# Patient Record
Sex: Female | Born: 2000 | Race: Black or African American | Hispanic: No | Marital: Single | Smoking: Current every day smoker
Health system: Southern US, Community
[De-identification: ages and names within clinical notes are randomized; demographics above are authoritative.]

## PROBLEM LIST (undated history)

## (undated) ENCOUNTER — Inpatient Hospital Stay (HOSPITAL_COMMUNITY): Payer: Self-pay

## (undated) ENCOUNTER — Ambulatory Visit (HOSPITAL_COMMUNITY): Source: Home / Self Care

## (undated) DIAGNOSIS — O139 Gestational [pregnancy-induced] hypertension without significant proteinuria, unspecified trimester: Secondary | ICD-10-CM

## (undated) HISTORY — DX: Gestational (pregnancy-induced) hypertension without significant proteinuria, unspecified trimester: O13.9

## (undated) HISTORY — PX: NO PAST SURGERIES: SHX2092

---
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
## 2019-01-05 ENCOUNTER — Encounter: Payer: Self-pay | Admitting: Obstetrics and Gynecology

## 2019-01-05 ENCOUNTER — Ambulatory Visit (INDEPENDENT_AMBULATORY_CARE_PROVIDER_SITE_OTHER): Payer: Medicaid Other | Admitting: Obstetrics and Gynecology

## 2019-01-05 ENCOUNTER — Other Ambulatory Visit: Payer: Self-pay

## 2019-01-05 DIAGNOSIS — Z3403 Encounter for supervision of normal first pregnancy, third trimester: Secondary | ICD-10-CM | POA: Diagnosis not present

## 2019-01-05 DIAGNOSIS — Z3400 Encounter for supervision of normal first pregnancy, unspecified trimester: Secondary | ICD-10-CM

## 2019-01-05 DIAGNOSIS — Z3A31 31 weeks gestation of pregnancy: Secondary | ICD-10-CM

## 2019-01-05 NOTE — Progress Notes (Signed)
S/w pt via tele-visit. Pt reports fetal movement, denies pain. 

## 2019-01-05 NOTE — Progress Notes (Signed)
   TELEHEALTH VIRTUAL OBSTETRICS VISIT ENCOUNTER NOTE  I connected with Kathryn Pena on 01/05/19 at  4:00 PM EDT by telephone at home and verified that I am speaking with the correct person using two identifiers.   I discussed the limitations, risks, security and privacy concerns of performing an evaluation and management service by telephone and the availability of in person appointments. I also discussed with the patient that there may be a patient responsible charge related to this service. The patient expressed understanding and agreed to proceed.  Subjective:  Kathryn Pena is a 18 y.o. G1P0 at [redacted]w[redacted]d being followed for ongoing prenatal care.  She is currently monitored for the following issues for this low-risk pregnancy and has Supervision of normal pregnancy, antepartum on their problem list.  Patients reports she is having trouble sleeping. Reports she is very tired but can't go to sleep. Has been happening for about a month. She reports that she has pain occasionally when baby moves. Reports fetal movement. Denies any contractions, bleeding or leaking of fluid.   The following portions of the patient's history were reviewed and updated as appropriate: allergies, current medications, past family history, past medical history, past social history, past surgical history and problem list.   Objective:   General:  Alert, oriented and cooperative.   Mental Status: Normal mood and affect perceived. Normal judgment and thought content.  Rest of physical exam deferred due to type of encounter  Assessment and Plan:  Pregnancy: G1P0 at [redacted]w[redacted]d  1. Supervision of normal first pregnancy, antepartum Enroll in babyscripts optimization Reviewed strategies for improved sleep  Preterm labor symptoms and general obstetric precautions including but not limited to vaginal bleeding, contractions, leaking of fluid and fetal movement were reviewed in detail with the patient.  I discussed the  assessment and treatment plan with the patient. The patient was provided an opportunity to ask questions and all were answered. The patient agreed with the plan and demonstrated an understanding of the instructions. The patient was advised to call back or seek an in-person office evaluation/go to [facility]U at [facility] for any urgent or concerning symptoms. Please refer to After Visit Summary for other counseling recommendations.   Return in about 3 weeks (around 01/26/2019) for OB visit.  Future Appointments  Date Time Provider Department Center  01/05/2019  4:00 PM Conan Bowens, MD [facility] None  01/19/2019  3:30 PM Hermina Staggers, MD [facility] None    Conan Bowens, MD Center for [facility]

## 2019-01-06 ENCOUNTER — Telehealth: Payer: Self-pay

## 2019-01-06 ENCOUNTER — Inpatient Hospital Stay (HOSPITAL_COMMUNITY)
Admission: AD | Admit: 2019-01-06 | Discharge: 2019-01-06 | Disposition: A | Discharge status: Home / Self Care | Payer: Medicaid Other | Source: Ambulatory Visit | Attending: Obstetrics and Gynecology | Admitting: Obstetrics and Gynecology

## 2019-01-06 ENCOUNTER — Other Ambulatory Visit: Payer: Self-pay

## 2019-01-06 ENCOUNTER — Encounter (HOSPITAL_COMMUNITY): Payer: Self-pay | Admitting: *Deleted

## 2019-01-06 DIAGNOSIS — R040 Epistaxis: Secondary | ICD-10-CM | POA: Diagnosis not present

## 2019-01-06 DIAGNOSIS — Z87891 Personal history of nicotine dependence: Secondary | ICD-10-CM | POA: Insufficient documentation

## 2019-01-06 DIAGNOSIS — Z3A31 31 weeks gestation of pregnancy: Secondary | ICD-10-CM | POA: Diagnosis not present

## 2019-01-06 DIAGNOSIS — Z3A36 36 weeks gestation of pregnancy: Secondary | ICD-10-CM | POA: Diagnosis not present

## 2019-01-06 DIAGNOSIS — O26893 Other specified pregnancy related conditions, third trimester: Secondary | ICD-10-CM | POA: Diagnosis not present

## 2019-01-06 DIAGNOSIS — O163 Unspecified maternal hypertension, third trimester: Secondary | ICD-10-CM | POA: Insufficient documentation

## 2019-01-06 DIAGNOSIS — Z3689 Encounter for other specified antenatal screening: Secondary | ICD-10-CM | POA: Insufficient documentation

## 2019-01-06 DIAGNOSIS — O9989 Other specified diseases and conditions complicating pregnancy, childbirth and the puerperium: Secondary | ICD-10-CM | POA: Diagnosis not present

## 2019-01-06 LAB — URINALYSIS, ROUTINE W REFLEX MICROSCOPIC
Bilirubin Urine: NEGATIVE
Glucose, UA: NEGATIVE mg/dL
Hgb urine dipstick: NEGATIVE
KETONES UR: NEGATIVE mg/dL
Leukocytes,Ua: NEGATIVE
NITRITE: NEGATIVE
Protein, ur: NEGATIVE mg/dL
Specific Gravity, Urine: 1.013 (ref 1.005–1.030)
pH: 7 (ref 5.0–8.0)

## 2019-01-06 LAB — CBC
HCT: 30.8 % — ABNORMAL LOW (ref 36.0–46.0)
Hemoglobin: 10 g/dL — ABNORMAL LOW (ref 12.0–15.0)
MCH: 28.8 pg (ref 26.0–34.0)
MCHC: 32.5 g/dL (ref 30.0–36.0)
MCV: 88.8 fL (ref 80.0–100.0)
NRBC: 0 % (ref 0.0–0.2)
Platelets: 193 10*3/uL (ref 150–400)
RBC: 3.47 MIL/uL — ABNORMAL LOW (ref 3.87–5.11)
RDW: 14.3 % (ref 11.5–15.5)
WBC: 5.1 10*3/uL (ref 4.0–10.5)

## 2019-01-06 LAB — COMPREHENSIVE METABOLIC PANEL
ALT: 13 U/L (ref 0–44)
AST: 17 U/L (ref 15–41)
Albumin: 2.8 g/dL — ABNORMAL LOW (ref 3.5–5.0)
Alkaline Phosphatase: 59 U/L (ref 38–126)
Anion gap: 7 (ref 5–15)
BUN: 6 mg/dL (ref 6–20)
CHLORIDE: 106 mmol/L (ref 98–111)
CO2: 22 mmol/L (ref 22–32)
Calcium: 8.7 mg/dL — ABNORMAL LOW (ref 8.9–10.3)
Creatinine, Ser: 0.78 mg/dL (ref 0.44–1.00)
GFR calc Af Amer: >60 mL/min (ref >60)
GFR calc non Af Amer: >60 mL/min (ref >60)
Glucose, Bld: 94 mg/dL (ref 70–99)
Potassium: 3.4 mmol/L — ABNORMAL LOW (ref 3.5–5.1)
Sodium: 135 mmol/L (ref 135–145)
Total Bilirubin: 0.3 mg/dL (ref 0.3–1.2)
Total Protein: 5.7 g/dL — ABNORMAL LOW (ref 6.5–8.1)

## 2019-01-06 LAB — PROTEIN / CREATININE RATIO, URINE
Creatinine, Urine: 102.62 mg/dL
Protein Creatinine Ratio: 0.11 mg/mg{Cre} (ref 0.00–0.15)
Total Protein, Urine: 11 mg/dL

## 2019-01-06 NOTE — Telephone Encounter (Signed)
TC from pt regarding Blood Pressure   reading at 9am was 150/90  9:30am 140/106 10:00am 130/90  Pt noted HA, no visual changes, no swelling, No LOF, no pain or pressure Consulted w/ MD in the office per Dr.Dove pt need to go to MAU for evaluation.

## 2019-01-06 NOTE — MAU Note (Signed)
PT presents to MAU with complaints of having a high blood pressure today while she was at work. She had a nose bleed this morning and her mom told her to check her blood pressure. Denies any pain

## 2019-01-06 NOTE — Discharge Instructions (Signed)
Hypertension During Pregnancy ° °Hypertension, commonly called high blood pressure, is when the force of blood pumping through your arteries is too strong. Arteries are blood vessels that carry blood from the heart throughout the body. Hypertension during pregnancy can cause problems for you and your baby. Your baby may be born early (prematurely) or may not weigh as much as he or she should at birth. Very bad cases of hypertension during pregnancy can be life-threatening. °Different types of hypertension can occur during pregnancy. These include: °· Chronic hypertension. This happens when: °? You have hypertension before pregnancy and it continues during pregnancy. °? You develop hypertension before you are [redacted] weeks pregnant, and it continues during pregnancy. °· Gestational hypertension. This is hypertension that develops after the 20th week of pregnancy. °· Preeclampsia, also called toxemia of pregnancy. This is a very serious type of hypertension that develops during pregnancy. It can be very dangerous for you and your baby. °? In rare cases, you may develop preeclampsia after giving birth (postpartum preeclampsia). This usually occurs within 48 hours after childbirth but may occur up to 6 weeks after giving birth. °Gestational hypertension and preeclampsia usually go away within 6 weeks after your baby is born. Women who have hypertension during pregnancy have a greater chance of developing hypertension later in life or during future pregnancies. °What are the causes? °The exact cause of hypertension during pregnancy is not known. °What increases the risk? °There are certain factors that make it more likely for you to develop hypertension during pregnancy. These include: °· Having hypertension during a previous pregnancy or prior to pregnancy. °· Being overweight. °· Being age 35 or older. °· Being pregnant for the first time. °· Being pregnant with more than one baby. °· Becoming pregnant using fertilization  methods such as IVF (in vitro fertilization). °· Having diabetes, kidney problems, or systemic lupus erythematosus. °· Having a family history of hypertension. °What are the signs or symptoms? °Chronic hypertension and gestational hypertension rarely cause symptoms. Preeclampsia causes symptoms, which may include: °· Increased protein in your urine. Your health care provider will check for this at every visit before you give birth (prenatal visit). °· Severe headaches. °· Sudden weight gain. °· Swelling of the hands, face, legs, and feet. °· Nausea and vomiting. °· Vision problems, such as blurred or double vision. °· Numbness in the face, arms, legs, and feet. °· Dizziness. °· Slurred speech. °· Sensitivity to bright lights. °· Abdominal pain. °· Convulsions or seizures. °How is this diagnosed? °You may be diagnosed with hypertension during a routine prenatal exam. At each prenatal visit, you may: °· Have a urine test to check for high amounts of protein in your urine. °· Have your blood pressure checked. A blood pressure reading is given as two numbers, such as "120 over 80" (or 120/80). The first ("top") number is a measure of the pressure in your arteries when your heart beats (systolic pressure). The second ("bottom") number is a measure of the pressure in your arteries as your heart relaxes between beats (diastolic pressure). Blood pressure is measured in a unit called mm Hg. For most women, a normal blood pressure reading is: °? Systolic: below 120. °? Diastolic: below 80. °The type of hypertension that you are diagnosed with depends on your test results and when your symptoms developed. °· Chronic hypertension is usually diagnosed before 20 weeks of pregnancy. °· Gestational hypertension is usually diagnosed after 20 weeks of pregnancy. °· Hypertension with high amounts of protein in   the urine is diagnosed as preeclampsia. °· Blood pressure measurements that stay above 160 systolic, or above 110 diastolic,  are signs of severe preeclampsia. °How is this treated? °Treatment for hypertension during pregnancy varies depending on the type of hypertension you have and how serious it is. °· If you take medicines called ACE inhibitors to treat chronic hypertension, you may need to switch medicines. ACE inhibitors should not be taken during pregnancy. °· If you have gestational hypertension, you may need to take blood pressure medicine. °· If you are at risk for preeclampsia, your health care provider may recommend that you take a low-dose aspirin during your pregnancy. °· If you have severe preeclampsia, you may need to be hospitalized so you and your baby can be monitored closely. You may also need to take medicine (magnesium sulfate) to prevent seizures and to lower blood pressure. This medicine may be given as an injection or through an IV. °· In some cases, if your condition gets worse, you may need to deliver your baby early. °Follow these instructions at home: °Eating and drinking ° °· Drink enough fluid to keep your urine pale yellow. °· Avoid caffeine. °Lifestyle °· Do not use any products that contain nicotine or tobacco, such as cigarettes and e-cigarettes. If you need help quitting, ask your health care provider. °· Do not use alcohol or drugs. °· Avoid stress as much as possible. Rest and get plenty of sleep. °General instructions °· Take over-the-counter and prescription medicines only as told by your health care provider. °· While lying down, lie on your left side. This keeps pressure off your major blood vessels. °· While sitting or lying down, raise (elevate) your feet. Try putting some pillows under your lower legs. °· Exercise regularly. Ask your health care provider what kinds of exercise are best for you. °· Keep all prenatal and follow-up visits as told by your health care provider. This is important. °Contact a health care provider if: °· You have symptoms that your health care provider told you may  require more treatment or monitoring, such as: °? Nausea or vomiting. °? Headache. °Get help right away if you have: °· Severe abdominal pain that does not get better with treatment. °· A severe headache that does not get better. °· Vomiting that does not get better. °· Sudden, rapid weight gain. °· Sudden swelling in your hands, ankles, or face. °· Vaginal bleeding. °· Blood in your urine. °· Fewer movements from your baby than usual. °· Blurred or double vision. °· Muscle twitching or sudden muscle tightening (spasms). °· Shortness of breath. °· Blue fingernails or lips. °Summary °· Hypertension, commonly called high blood pressure, is when the force of blood pumping through your arteries is too strong. °· Hypertension during pregnancy can cause problems for you and your baby. °· Treatment for hypertension during pregnancy varies depending on the type of hypertension you have and how serious it is. °· Get help right away if you have symptoms that your health care provider told you to watch for. °This information is not intended to replace advice given to you by your health care provider. Make sure you discuss any questions you have with your health care provider. °Document Released: 06/17/2011 Document Revised: 09/15/2017 Document Reviewed: 03/14/2016 °Elsevier Interactive Patient Education © 2019 [employer] ° °

## 2019-01-06 NOTE — MAU Provider Note (Addendum)
History     CSN: [id number]  Arrival date and time: 01/06/19 1140   First Provider Initiated Contact with Patient 01/06/19 1208      Chief Complaint  Patient presents with  . Hypertension   Ms. Kathryn Pena is a 18 y.o. G1P0 at [redacted]w[redacted]d who presents to MAU for preeclampsia evaluation after she was at work and experienced a nose bleed at about 0730. Pt reports nose bleed lasted about . Pt denies bleeding from her nose since. Pt reports her mother suggested she take her BP and it was 150/90. About 30 min later it was 140/106, later 130/80. Pt called the office and was told to come to MAU for evaluation.  Pt reports HA about 30-57min after her nose bleed and took 2 extra strength Tylenol and HA resolved within an hour. Pt took Tylenol around 0800 today. Denies HA at this time.  Pt denies blurry vision/seeing spots, N/V, epigastric pain, swelling in face and hands, sudden weight gain. Pt denies chest pain and SOB.   Pt denies VB, ctx, LOF and reports good FM.  Current pregnancy problems? none Blood Type? O positive Allergies? NKDA Current medications? PNVs only Current PNC & next appt? [facility], televisit 01/19/2019  Mother present for entire visit.   OB History    Gravida  1   Para      Term      Preterm      AB      Living        SAB      TAB      Ectopic      Multiple      Live Births              Past Medical History:  Diagnosis Date  . Medical history non-contributory     Past Surgical History:  Procedure Laterality Date  . NO PAST SURGERIES      Family History  Problem Relation Age of Onset  . Healthy Mother     Social History   Tobacco Use  . Smoking status: Former Smoker    Types: Cigars    Last attempt to quit: 06/27/2018    Years since quitting: 0.5  . Smokeless tobacco: Never Used  Substance Use Topics  . Alcohol use: No  . Drug use: No    Allergies: No Known Allergies  No medications prior to admission.     Review of Systems  Constitutional: Negative for chills, diaphoresis, fatigue and fever.  HENT: Positive for nosebleeds.   Respiratory: Negative for shortness of breath.   Cardiovascular: Negative for chest pain.  Gastrointestinal: Negative for abdominal distention and abdominal pain.  Genitourinary: Negative for vaginal bleeding and vaginal discharge.  Neurological: Positive for headaches. Negative for dizziness, weakness and light-headedness.   Physical Exam   Blood pressure 127/87, pulse 83, temperature 98.6 F (37 C), resp. rate 16, height 5\' 7"  (1.702 m), weight 97.5 kg, last menstrual period 06/05/2018, SpO2 99 %.  Patient Vitals for the past 24 hrs:  BP Temp Pulse Resp SpO2 Height Weight  01/06/19 1430 127/87 - 83 - 99 % - -  01/06/19 1415 130/82 - 85 - 100 % - -  01/06/19 1400 125/76 - 90 - 100 % - -  01/06/19 1330 124/76 - 88 - 98 % - -  01/06/19 1300 133/85 - 83 - 100 % - -  01/06/19 1245 130/75 - 85 - 98 % - -  01/06/19 1230 135/80 - 88 -  100 % - -  01/06/19 1215 (!) 144/81 - 84 - 99 % - -  01/06/19 1203 (!) 144/80 - 92 - - - -  01/06/19 1154 - - - - - 5\' 7"  (1.702 m) 97.5 kg  01/06/19 1152 (!) 143/78 98.6 F (37 C) 95 16 100 % - -    Physical Exam  Constitutional: She is oriented to person, place, and time. She appears well-developed and well-nourished. No distress.  HENT:  Head: Normocephalic.  Nose: Nose normal.  Respiratory: Effort normal.  GI: Soft. She exhibits no distension and no mass. There is no abdominal tenderness. There is no rebound and no guarding.  Musculoskeletal:        General: Edema (mild, bilateral, non-pitting pedal edema) present.  Neurological: She is alert and oriented to person, place, and time. She has normal reflexes.  Skin: Skin is warm and dry. She is not diaphoretic.  Psychiatric: She has a normal mood and affect. Her behavior is normal.   Results for orders placed or performed during the hospital encounter of 01/06/19 (from  the past 24 hour(s))  Urinalysis, Routine w reflex microscopic     Status: None   Collection Time: 01/06/19 11:50 AM  Result Value Ref Range   Color, Urine YELLOW YELLOW   APPearance CLEAR CLEAR   Specific Gravity, Urine 1.013 1.005 - 1.030   pH 7.0 5.0 - 8.0   Glucose, UA NEGATIVE NEGATIVE mg/dL   Hgb urine dipstick NEGATIVE NEGATIVE   Bilirubin Urine NEGATIVE NEGATIVE   Ketones, ur NEGATIVE NEGATIVE mg/dL   Protein, ur NEGATIVE NEGATIVE mg/dL   Nitrite NEGATIVE NEGATIVE   Leukocytes,Ua NEGATIVE NEGATIVE  Protein / creatinine ratio, urine     Status: None   Collection Time: 01/06/19 12:15 PM  Result Value Ref Range   Creatinine, Urine 102.62 mg/dL   Total Protein, Urine 11 mg/dL   Protein Creatinine Ratio 0.11 0.00 - 0.15 mg/mg[Cre]  CBC     Status: Abnormal   Collection Time: 01/06/19 12:39 PM  Result Value Ref Range   WBC 5.1 4.0 - 10.5 K/uL   RBC 3.47 (L) 3.87 - 5.11 MIL/uL   Hemoglobin 10.0 (L) 12.0 - 15.0 g/dL   HCT 46.5 (L) 03.5 - 46.5 %   MCV 88.8 80.0 - 100.0 fL   MCH 28.8 26.0 - 34.0 pg   MCHC 32.5 30.0 - 36.0 g/dL   RDW 68.1 27.5 - 17.0 %   Platelets 193 150 - 400 K/uL   nRBC 0.0 0.0 - 0.2 %  Comprehensive metabolic panel     Status: Abnormal   Collection Time: 01/06/19 12:39 PM  Result Value Ref Range   Sodium 135 135 - 145 mmol/L   Potassium 3.4 (L) 3.5 - 5.1 mmol/L   Chloride 106 98 - 111 mmol/L   CO2 22 22 - 32 mmol/L   Glucose, Bld 94 70 - 99 mg/dL   BUN 6 6 - 20 mg/dL   Creatinine, Ser 0.17 0.44 - 1.00 mg/dL   Calcium 8.7 (L) 8.9 - 10.3 mg/dL   Total Protein 5.7 (L) 6.5 - 8.1 g/dL   Albumin 2.8 (L) 3.5 - 5.0 g/dL   AST 17 15 - 41 U/L   ALT 13 0 - 44 U/L   Alkaline Phosphatase 59 38 - 126 U/L   Total Bilirubin 0.3 0.3 - 1.2 mg/dL   GFR calc non Af Amer >60 >60 mL/min   GFR calc Af Amer >60 >60 mL/min   Anion  gap 7 5 - 15    No results found.   MAU Course  Procedures  MDM -preeclampsia work-up -no severe range BPs in MAU -EFM baseline  135, mod variability, pos accels (10x10), 1-2 variable decels. TOCO ctx x2, uterine irritability -pt reports feeling 2ctx in MAU, reports she "always feels them when she lays down" -CE: closed, thick, posterior -UA: WNL -CBC: H/H 10/30.8, RBCs 3.47 (pt denies s/sx of anemia - see ROS), otherwise WNL -CMP: no abnormal labs requiring treatment at this time, AST/ALT 17/13 -Pro/Creat: 0.11 -discharge to home in stable condition with mother  Orders Placed This Encounter  Procedures  . Urinalysis, Routine w reflex microscopic    Standing Status:   Standing    Number of Occurrences:   1  . CBC    Standing Status:   Standing    Number of Occurrences:   1  . Comprehensive metabolic panel    Standing Status:   Standing    Number of Occurrences:   1  . Protein / creatinine ratio, urine    Standing Status:   Standing    Number of Occurrences:   1  . Discharge patient    Order Specific Question:   Discharge disposition    Answer:   01-Home or Self Care [1]    Order Specific Question:   Discharge patient date    Answer:   01/06/2019   No orders of the defined types were placed in this encounter.   Assessment and Plan   1. Elevated blood pressure affecting pregnancy in third trimester, antepartum   2. [redacted] weeks gestation of pregnancy   3. NST (non-stress test) reactive   4. Epistaxis    -preeclampsia/PTL/nosebleed/MAU return precautions reviewed -pt advised to hydrate d/t uterine irritability on monitor; discussed appropriate hydration in pregnancy, advised to avoid caffeine -discussed epistaxis in pregnancy -discharged to home in stable condition with mother -please go to the clinic tomorrow AM for a BP check, clinic notified by Epic message to call pt to schedule, pt aware to proceed to clinic in AM even if no phone call received.  Odie Sera Nugent 01/06/2019, 3:09 PM

## 2019-01-07 ENCOUNTER — Ambulatory Visit (INDEPENDENT_AMBULATORY_CARE_PROVIDER_SITE_OTHER): Payer: Medicaid Other

## 2019-01-07 VITALS — BP 131/83 | HR 84 | Ht 67.0 in | Wt 212.5 lb

## 2019-01-07 DIAGNOSIS — O163 Unspecified maternal hypertension, third trimester: Secondary | ICD-10-CM

## 2019-01-07 NOTE — Progress Notes (Signed)
Subjective:  Kathryn Pena is a 18 y.o. female [redacted]w[redacted]d gestation; here for BP check.   Hypertension ROS: taking medications as instructed, no medication side effects noted, no TIA's, no chest pain on exertion, no dyspnea on exertion and no swelling of ankles.    Objective:  BP 131/83   Pulse 84   Ht 5\' 7"  (1.702 m)   Wt 212 lb 8 oz (96.4 kg)   LMP 06/05/2018   BMI 33.28 kg/m    Appearance alert, well appearing, and in no distress. General exam BP noted to be well controlled today in office.    Assessment:   Blood Pressure stable.   Plan:  Current treatment plan is effective, no change in therapy.

## 2019-01-17 ENCOUNTER — Telehealth: Payer: Self-pay

## 2019-01-17 NOTE — Telephone Encounter (Signed)
Contacted pt about logging BPs, pt states that she does not have a cuff, advised to pick up from office.

## 2019-01-19 ENCOUNTER — Encounter: Payer: Self-pay | Admitting: Obstetrics and Gynecology

## 2019-01-19 ENCOUNTER — Other Ambulatory Visit: Payer: Self-pay

## 2019-01-19 ENCOUNTER — Ambulatory Visit (INDEPENDENT_AMBULATORY_CARE_PROVIDER_SITE_OTHER): Payer: Medicaid Other | Admitting: Obstetrics and Gynecology

## 2019-01-19 VITALS — BP 129/91 | HR 81

## 2019-01-19 DIAGNOSIS — Z3A33 33 weeks gestation of pregnancy: Secondary | ICD-10-CM | POA: Diagnosis not present

## 2019-01-19 DIAGNOSIS — O133 Gestational [pregnancy-induced] hypertension without significant proteinuria, third trimester: Secondary | ICD-10-CM

## 2019-01-19 DIAGNOSIS — Z3400 Encounter for supervision of normal first pregnancy, unspecified trimester: Secondary | ICD-10-CM

## 2019-01-19 NOTE — Progress Notes (Signed)
Pt states that she checked her bp this am and it was 129/91. Pt complains of no headaches, visual changes, or epigastric pain. Pt states that she does have some slight swelling at the end of the day in her feet. She has no other concerns.

## 2019-01-19 NOTE — Progress Notes (Signed)
   TELEHEALTH VIRTUAL OBSTETRICS VISIT ENCOUNTER NOTE  I connected with Kathryn Pena on 01/19/19 at  3:30 PM EDT by telephone at home and verified that I am speaking with the correct person using two identifiers.   I discussed the limitations, risks, security and privacy concerns of performing an evaluation and management service by telephone and the availability of in person appointments. I also discussed with the patient that there may be a patient responsible charge related to this service. The patient expressed understanding and agreed to proceed.  Subjective:  Kathryn Pena is a 18 y.o. G1P0 at [redacted]w[redacted]d being followed for ongoing prenatal care.  She is currently monitored for the following issues for this low-risk pregnancy and has Supervision of normal pregnancy, antepartum and Elevated blood pressure affecting pregnancy, antepartum on their problem list.  Patient reports no complaints. Reports fetal movement. Denies any contractions, bleeding or leaking of fluid.   The following portions of the patient's history were reviewed and updated as appropriate: allergies, current medications, past family history, past medical history, past social history, past surgical history and problem list.   Objective:   General:  Alert, oriented and cooperative.   Mental Status: Normal mood and affect perceived. Normal judgment and thought content.  Rest of physical exam deferred due to type of encounter  Assessment and Plan:  Pregnancy: G1P0 at [redacted]w[redacted]d 1. Elevated blood pressure affecting pregnancy, antepartum Monitoring BP at home BP yesterday 123/81 No S/Sx of PEC Will continue to monitor BP daily  2. Supervision of normal first pregnancy, antepartum Stable Vaginal cultures with next visit  Preterm labor symptoms and general obstetric precautions including but not limited to vaginal bleeding, contractions, leaking of fluid and fetal movement were reviewed in detail with the patient.  I  discussed the assessment and treatment plan with the patient. The patient was provided an opportunity to ask questions and all were answered. The patient agreed with the plan and demonstrated an understanding of the instructions. The patient was advised to call back or seek an in-person office evaluation/go to MAU at [facility] for any urgent or concerning symptoms. Please refer to After Visit Summary for other counseling recommendations.   I provided 11 minutes of non-face-to-face time during this encounter.  Return in about 2 weeks (around 02/02/2019) for OB visit face to face for GBS.  No future appointments.  Hermina Staggers, MD [facility], [facility]

## 2019-01-28 ENCOUNTER — Telehealth: Payer: Self-pay

## 2019-01-28 NOTE — Telephone Encounter (Signed)
Kathryn Pena from [facility] called to report the patient had an elevated BP of 149/90 on 01/25/19. I called patient to have her recheck bp this am bp is 134/90 pulse 85. Pt does not have any headache, swelling, visual changes, or epigastric pain. Discussed with Dr. Marice Potter who states for patient to continue to monitor BP.   Informed patient of this and advised for her to contact office or go to MAU for evaluation if she develops headache, swelling, visual changes, or epigastric pain.

## 2019-01-31 ENCOUNTER — Telehealth: Payer: Self-pay | Admitting: *Deleted

## 2019-01-31 NOTE — Telephone Encounter (Signed)
Spoke with pt today after receiving call from [facility] re: BP readings.  Had pt repeat BP while on phone- 131/93. Pt had readings earlier (per [facility]) of 143/94, 135/93, 120/90. Pt denies any symptoms at this time- no HA,visiopn changes, swelling changes. Pt has OB appt this week and advised to keep appt, may need additional lab work.  Pt made aware to review with Dr Jolayne Panther for any further recommendations.

## 2019-02-01 ENCOUNTER — Telehealth: Payer: Self-pay | Admitting: *Deleted

## 2019-02-01 NOTE — Telephone Encounter (Signed)
Review with Dr Jolayne Panther, pt to keep OB appt this week and continue to monitor BP. Pt needs to discuss at appt.

## 2019-02-01 NOTE — Telephone Encounter (Signed)
Spoke with pt after call from [facility] regarding BP. Pt readings today, 144/96 repeat at time of call- 147/88. Pt states no symptoms at this time. Pt advised to keep appt tomorrow. Advised if any changes or signs/symptoms to be seen at hospital. Pt states understanding.

## 2019-02-02 ENCOUNTER — Other Ambulatory Visit (HOSPITAL_COMMUNITY)
Admission: RE | Admit: 2019-02-02 | Discharge: 2019-02-02 | Disposition: A | Discharge status: Home / Self Care | Payer: Medicaid Other | Source: Ambulatory Visit | Attending: Obstetrics & Gynecology | Admitting: Obstetrics & Gynecology

## 2019-02-02 ENCOUNTER — Other Ambulatory Visit: Payer: Self-pay

## 2019-02-02 ENCOUNTER — Ambulatory Visit (INDEPENDENT_AMBULATORY_CARE_PROVIDER_SITE_OTHER): Payer: Medicaid Other | Admitting: Obstetrics & Gynecology

## 2019-02-02 ENCOUNTER — Other Ambulatory Visit: Payer: Self-pay | Admitting: Obstetrics & Gynecology

## 2019-02-02 DIAGNOSIS — Z3A35 35 weeks gestation of pregnancy: Secondary | ICD-10-CM

## 2019-02-02 DIAGNOSIS — O133 Gestational [pregnancy-induced] hypertension without significant proteinuria, third trimester: Secondary | ICD-10-CM

## 2019-02-02 DIAGNOSIS — Z3400 Encounter for supervision of normal first pregnancy, unspecified trimester: Secondary | ICD-10-CM | POA: Insufficient documentation

## 2019-02-02 NOTE — Progress Notes (Signed)
Patient reports fetal movement with occasional pressure. 

## 2019-02-02 NOTE — Progress Notes (Signed)
   PRENATAL VISIT NOTE  Subjective:  Kathryn Pena is a 18 y.o. G1P0 at [redacted]w[redacted]d being seen today for ongoing prenatal care.  She is currently monitored for the following issues for this high-risk pregnancy and has Supervision of normal pregnancy, antepartum and Gestational (pregnancy-induced) hypertension without significant proteinuria, third trimester on their problem list.  Patient reports no complaints and Leg edema.  Contractions: Irregular. Vag. Bleeding: None.  Movement: Present. Denies leaking of fluid.   The following portions of the patient's history were reviewed and updated as appropriate: allergies, current medications, past family history, past medical history, past social history, past surgical history and problem list.   Objective:   Vitals:   02/02/19 1611  BP: 132/87  Pulse: 91  Weight: 225 lb 6.4 oz (102.2 kg)    Fetal Status: Fetal Heart Rate (bpm): 140   Movement: Present     General:  Alert, oriented and cooperative. Patient is in no acute distress.  Skin: Skin is warm and dry. No rash noted.   Cardiovascular: Normal heart rate noted  Respiratory: Normal respiratory effort, no problems with respiration noted  Abdomen: Soft, gravid, appropriate for gestational age.  Pain/Pressure: Absent     Pelvic: Cervical exam deferred        Extremities: Normal range of motion.  Edema: Trace  Mental Status: Normal mood and affect. Normal behavior. Normal judgment and thought content.   Assessment and Plan:  Pregnancy: G1P0 at [redacted]w[redacted]d 1. Supervision of normal first pregnancy, antepartum routine - Strep Gp B NAA - Cervicovaginal ancillary only( [facility])  2. Gestational (pregnancy-induced) hypertension without significant proteinuria, third trimester Needs IOL 37 weeks and NST today, growth [place]  Preterm labor symptoms and general obstetric precautions including but not limited to vaginal bleeding, contractions, leaking of fluid and fetal movement were reviewed in  detail with the patient. Please refer to After Visit Summary for other counseling recommendations.   Return in about 1 week (around 02/09/2019).  No future appointments.  Scheryl Darter, MD

## 2019-02-02 NOTE — Patient Instructions (Signed)
Labor Induction    Labor induction is when steps are taken to cause a pregnant woman to begin the labor process. Most women go into labor on their own between 37 weeks and 42 weeks of pregnancy. When this does not happen or when there is a medical need for labor to begin, steps may be taken to induce labor. Labor induction causes a pregnant woman's uterus to contract. It also causes the cervix to soften (ripen), open (dilate), and thin out (efface). Usually, labor is not induced before 39 weeks of pregnancy unless there is a medical reason to do so. Your health care provider will determine if labor induction is needed.  Before inducing labor, your health care provider will consider a number of factors, including:  · Your medical condition and your baby's.  · How many weeks along you are in your pregnancy.  · How mature your baby's lungs are.  · The condition of your cervix.  · The position of your baby.  · The size of your birth canal.  What are some reasons for labor induction?  Labor may be induced if:  · Your health or your baby's health is at risk.  · Your pregnancy is overdue by 1 week or more.  · Your water breaks but labor does not start on its own.  · There is a low amount of amniotic fluid around your baby.  You may also choose (elect) to have labor induced at a certain time. Generally, elective labor induction is done no earlier than 39 weeks of pregnancy.  What methods are used for labor induction?  Methods used for labor induction include:  · Prostaglandin medicine. This medicine starts contractions and causes the cervix to dilate and ripen. It can be taken by mouth (orally) or by being inserted into the vagina (suppository).  · Inserting a small, thin tube (catheter) with a balloon into the vagina and then expanding the balloon with water to dilate the cervix.  · Stripping the membranes. In this method, your health care provider gently separates amniotic sac tissue from the cervix. This causes the  cervix to stretch, which in turn causes the release of a hormone called progesterone. The hormone causes the uterus to contract. This procedure is often done during an office visit, after which you will be sent home to wait for contractions to begin.  · Breaking the water. In this method, your health care provider uses a small instrument to make a small hole in the amniotic sac. This eventually causes the amniotic sac to break. Contractions should begin after a few hours.  · Medicine to trigger or strengthen contractions. This medicine is given through an IV that is inserted into a vein in your arm.  Except for membrane stripping, which can be done in a clinic, labor induction is done in the hospital so that you and your baby can be carefully monitored.  How long does it take for labor to be induced?  The length of time it takes to induce labor depends on how ready your body is for labor. Some inductions can take up to 2-3 days, while others may take less than a day. Induction may take longer if:  · You are induced early in your pregnancy.  · It is your first pregnancy.  · Your cervix is not ready.  What are some risks associated with labor induction?  Some risks associated with labor induction include:  · Changes in fetal heart rate, such as being too   high, too low, or irregular (erratic).  · Failed induction.  · Infection in the mother or the baby.  · Increased risk of having a cesarean delivery.  · Fetal death.  · Breaking off (abruption) of the placenta from the uterus (rare).  · Rupture of the uterus (very rare).  When induction is needed for medical reasons, the benefits of induction generally outweigh the risks.  What are some reasons for not inducing labor?  Labor induction should not be done if:  · Your baby does not tolerate contractions.  · You have had previous surgeries on your uterus, such as a myomectomy, removal of fibroids, or a vertical scar from a previous cesarean delivery.  · Your placenta lies  very low in your uterus and blocks the opening of the cervix (placenta previa).  · Your baby is not in a head-down position.  · The umbilical cord drops down into the birth canal in front of the baby.  · There are unusual circumstances, such as the baby being very early (premature).  · You have had more than 2 previous cesarean deliveries.  Summary  · Labor induction is when steps are taken to cause a pregnant woman to begin the labor process.  · Labor induction causes a pregnant woman's uterus to contract. It also causes the cervix to ripen, dilate, and efface.  · Labor is not induced before 39 weeks of pregnancy unless there is a medical reason to do so.  · When induction is needed for medical reasons, the benefits of induction generally outweigh the risks.  This information is not intended to replace advice given to you by your health care provider. Make sure you discuss any questions you have with your health care provider.  Document Released: 02/18/2007 Document Revised: 11/12/2016 Document Reviewed: 11/12/2016  Elsevier Interactive Patient Education © 2019 [employer]

## 2019-02-03 ENCOUNTER — Telehealth (HOSPITAL_COMMUNITY): Payer: Self-pay | Admitting: *Deleted

## 2019-02-03 ENCOUNTER — Encounter (HOSPITAL_COMMUNITY): Payer: Self-pay | Admitting: *Deleted

## 2019-02-03 NOTE — Telephone Encounter (Signed)
Preadmission screen  

## 2019-02-04 LAB — CERVICOVAGINAL ANCILLARY ONLY
Chlamydia: NEGATIVE
Neisseria Gonorrhea: NEGATIVE

## 2019-02-04 LAB — STREP GP B NAA: Strep Gp B NAA: NEGATIVE

## 2019-02-07 ENCOUNTER — Other Ambulatory Visit: Payer: Self-pay | Admitting: Family Medicine

## 2019-02-09 ENCOUNTER — Ambulatory Visit (HOSPITAL_COMMUNITY)
Admission: RE | Admit: 2019-02-09 | Discharge: 2019-02-09 | Disposition: A | Discharge status: Home / Self Care | Payer: Medicaid Other | Source: Ambulatory Visit | Attending: Obstetrics and Gynecology | Admitting: Obstetrics and Gynecology

## 2019-02-09 ENCOUNTER — Other Ambulatory Visit (HOSPITAL_COMMUNITY): Payer: Self-pay | Admitting: Maternal & Fetal Medicine

## 2019-02-09 ENCOUNTER — Other Ambulatory Visit: Payer: Self-pay | Admitting: Obstetrics & Gynecology

## 2019-02-09 ENCOUNTER — Telehealth: Payer: Self-pay | Admitting: *Deleted

## 2019-02-09 ENCOUNTER — Other Ambulatory Visit: Payer: Self-pay

## 2019-02-09 ENCOUNTER — Encounter: Payer: Medicaid Other | Admitting: Obstetrics & Gynecology

## 2019-02-09 DIAGNOSIS — Z3A36 36 weeks gestation of pregnancy: Secondary | ICD-10-CM

## 2019-02-09 DIAGNOSIS — O133 Gestational [pregnancy-induced] hypertension without significant proteinuria, third trimester: Secondary | ICD-10-CM | POA: Diagnosis present

## 2019-02-09 DIAGNOSIS — Z362 Encounter for other antenatal screening follow-up: Secondary | ICD-10-CM | POA: Diagnosis not present

## 2019-02-09 DIAGNOSIS — Z3400 Encounter for supervision of normal first pregnancy, unspecified trimester: Secondary | ICD-10-CM

## 2019-02-09 NOTE — Telephone Encounter (Signed)
Call received from [facility] regarding increase in BP reading. Reading today is 135/95. Dr. Debroah Loop made aware, pt has appt with him today.

## 2019-02-10 ENCOUNTER — Encounter: Payer: Self-pay | Admitting: Obstetrics and Gynecology

## 2019-02-10 ENCOUNTER — Ambulatory Visit (INDEPENDENT_AMBULATORY_CARE_PROVIDER_SITE_OTHER): Payer: Medicaid Other | Admitting: Obstetrics and Gynecology

## 2019-02-10 VITALS — BP 142/97 | HR 92 | Wt 228.0 lb

## 2019-02-10 DIAGNOSIS — O133 Gestational [pregnancy-induced] hypertension without significant proteinuria, third trimester: Secondary | ICD-10-CM

## 2019-02-10 DIAGNOSIS — Z3A36 36 weeks gestation of pregnancy: Secondary | ICD-10-CM

## 2019-02-10 DIAGNOSIS — Z3400 Encounter for supervision of normal first pregnancy, unspecified trimester: Secondary | ICD-10-CM

## 2019-02-10 NOTE — Progress Notes (Signed)
   PRENATAL VISIT NOTE  Subjective:  Kathryn Pena is a 18 y.o. G1P0 at [redacted]w[redacted]d being seen today for ongoing prenatal care.  She is currently monitored for the following issues for this high-risk pregnancy and has Supervision of normal pregnancy, antepartum and Gestational (pregnancy-induced) hypertension without significant proteinuria, third trimester on their problem list.  Patient reports occasional contractions.  Contractions: Irritability. Vag. Bleeding: None.  Movement: Present. Denies leaking of fluid.   The following portions of the patient's history were reviewed and updated as appropriate: allergies, current medications, past family history, past medical history, past social history, past surgical history and problem list.   Objective:   Vitals:   02/10/19 1028  BP: (!) 142/97  Pulse: 92  Weight: 228 lb (103.4 kg)    Fetal Status:     Movement: Present     General:  Alert, oriented and cooperative. Patient is in no acute distress.  Skin: Skin is warm and dry. No rash noted.   Cardiovascular: Normal heart rate noted  Respiratory: Normal respiratory effort, no problems with respiration noted  Abdomen: Soft, gravid, appropriate for gestational age.  Pain/Pressure: Absent     Pelvic: Cervical exam deferred        Extremities: Normal range of motion.  Edema: Trace  Mental Status: Normal mood and affect. Normal behavior. Normal judgment and thought content.   Assessment and Plan:  Pregnancy: G1P0 at [redacted]w[redacted]d  1. Supervision of normal first pregnancy, antepartum Answered all questions, reviewed induction  2. Gestational (pregnancy-induced) hypertension without significant proteinuria, third trimester IOL scheduled in 2 days for 37 weeks Reviewed reasons to present to MAU earlier than scheduled NSt reactive  Preterm labor symptoms and general obstetric precautions including but not limited to vaginal bleeding, contractions, leaking of fluid and fetal movement were reviewed  in detail with the patient. Please refer to After Visit Summary for other counseling recommendations.   Return in about 5 weeks (around 03/17/2019) for post partum check.  Future Appointments  Date Time Provider Department Center  02/12/2019  7:30 AM [facility] SCHED ROOM [facility] None    Conan Bowens, MD

## 2019-02-10 NOTE — Progress Notes (Signed)
U/S 02/09/19 GBS done on 02/02/19 NEGATIVE   Patient Needs Out of Work Note pregnancy and Post partum.  IOL on 02/12/19  Pt denies an HA's or Visual Changes.

## 2019-02-12 ENCOUNTER — Inpatient Hospital Stay (HOSPITAL_COMMUNITY): Payer: Medicaid Other

## 2019-02-12 ENCOUNTER — Inpatient Hospital Stay (HOSPITAL_COMMUNITY): Payer: Medicaid Other | Admitting: Anesthesiology

## 2019-02-12 ENCOUNTER — Inpatient Hospital Stay (HOSPITAL_COMMUNITY)
Admission: AD | Admit: 2019-02-12 | Discharge: 2019-02-16 | DRG: 806 | Disposition: A | Discharge status: Home / Self Care | Payer: Medicaid Other | Attending: Family Medicine | Admitting: Family Medicine

## 2019-02-12 ENCOUNTER — Other Ambulatory Visit: Payer: Self-pay

## 2019-02-12 ENCOUNTER — Encounter (HOSPITAL_COMMUNITY): Payer: Self-pay | Admitting: *Deleted

## 2019-02-12 DIAGNOSIS — O1410 Severe pre-eclampsia, unspecified trimester: Secondary | ICD-10-CM | POA: Diagnosis not present

## 2019-02-12 DIAGNOSIS — Z30430 Encounter for insertion of intrauterine contraceptive device: Secondary | ICD-10-CM

## 2019-02-12 DIAGNOSIS — O1414 Severe pre-eclampsia complicating childbirth: Secondary | ICD-10-CM | POA: Diagnosis present

## 2019-02-12 DIAGNOSIS — O133 Gestational [pregnancy-induced] hypertension without significant proteinuria, third trimester: Secondary | ICD-10-CM

## 2019-02-12 DIAGNOSIS — O26833 Pregnancy related renal disease, third trimester: Secondary | ICD-10-CM | POA: Diagnosis present

## 2019-02-12 DIAGNOSIS — N179 Acute kidney failure, unspecified: Secondary | ICD-10-CM | POA: Diagnosis present

## 2019-02-12 DIAGNOSIS — Z3A37 37 weeks gestation of pregnancy: Secondary | ICD-10-CM

## 2019-02-12 DIAGNOSIS — O134 Gestational [pregnancy-induced] hypertension without significant proteinuria, complicating childbirth: Secondary | ICD-10-CM | POA: Diagnosis present

## 2019-02-12 DIAGNOSIS — O139 Gestational [pregnancy-induced] hypertension without significant proteinuria, unspecified trimester: Secondary | ICD-10-CM | POA: Diagnosis present

## 2019-02-12 DIAGNOSIS — Z87891 Personal history of nicotine dependence: Secondary | ICD-10-CM

## 2019-02-12 DIAGNOSIS — Z30017 Encounter for initial prescription of implantable subdermal contraceptive: Secondary | ICD-10-CM | POA: Diagnosis not present

## 2019-02-12 LAB — CBC
HCT: 32 % — ABNORMAL LOW (ref 36.0–46.0)
HCT: 33.3 % — ABNORMAL LOW (ref 36.0–46.0)
Hemoglobin: 10.2 g/dL — ABNORMAL LOW (ref 12.0–15.0)
Hemoglobin: 10.7 g/dL — ABNORMAL LOW (ref 12.0–15.0)
MCH: 28.5 pg (ref 26.0–34.0)
MCH: 28.5 pg (ref 26.0–34.0)
MCHC: 31.9 g/dL (ref 30.0–36.0)
MCHC: 32.1 g/dL (ref 30.0–36.0)
MCV: 89.4 fL (ref 80.0–100.0)
MCV: 88.8 fL (ref 80.0–100.0)
Platelets: 197 10*3/uL (ref 150–400)
Platelets: 191 10*3/uL (ref 150–400)
RBC: 3.58 MIL/uL — ABNORMAL LOW (ref 3.87–5.11)
RBC: 3.75 MIL/uL — ABNORMAL LOW (ref 3.87–5.11)
RDW: 14.6 % (ref 11.5–15.5)
RDW: 14.6 % (ref 11.5–15.5)
WBC: 5.4 10*3/uL (ref 4.0–10.5)
WBC: 6.3 10*3/uL (ref 4.0–10.5)
nRBC: 0 % (ref 0.0–0.2)
nRBC: 0 % (ref 0.0–0.2)

## 2019-02-12 LAB — ABO/RH: ABO/RH(D): O POS

## 2019-02-12 LAB — TYPE AND SCREEN
ABO/RH(D): O POS
Antibody Screen: NEGATIVE

## 2019-02-12 LAB — COMPREHENSIVE METABOLIC PANEL
ALT: 15 U/L (ref 0–44)
AST: 18 U/L (ref 15–41)
Albumin: 2.7 g/dL — ABNORMAL LOW (ref 3.5–5.0)
Alkaline Phosphatase: 100 U/L (ref 38–126)
Anion gap: 9 (ref 5–15)
BUN: <5 mg/dL — ABNORMAL LOW (ref 6–20)
CO2: 21 mmol/L — ABNORMAL LOW (ref 22–32)
Calcium: 8.8 mg/dL — ABNORMAL LOW (ref 8.9–10.3)
Chloride: 110 mmol/L (ref 98–111)
Creatinine, Ser: 0.83 mg/dL (ref 0.44–1.00)
GFR calc Af Amer: >60 mL/min (ref >60)
GFR calc non Af Amer: >60 mL/min (ref >60)
Glucose, Bld: 75 mg/dL (ref 70–99)
Potassium: 3.4 mmol/L — ABNORMAL LOW (ref 3.5–5.1)
Sodium: 140 mmol/L (ref 135–145)
Total Bilirubin: 0.3 mg/dL (ref 0.3–1.2)
Total Protein: 5.4 g/dL — ABNORMAL LOW (ref 6.5–8.1)

## 2019-02-12 LAB — PROTEIN / CREATININE RATIO, URINE
Creatinine, Urine: 52.6 mg/dL
Protein Creatinine Ratio: 0.11 mg/mg{Cre} (ref 0.00–0.15)
Total Protein, Urine: 6 mg/dL

## 2019-02-12 MED ORDER — OXYTOCIN 40 UNITS IN NORMAL SALINE INFUSION - SIMPLE MED
2.5000 [IU]/h | INTRAVENOUS | Status: DC
  Administered 2019-02-14: 02:00:00 2.5 [IU]/h via INTRAVENOUS
  Filled 2019-02-12: qty 1000

## 2019-02-12 MED ORDER — MISOPROSTOL 25 MCG QUARTER TABLET
25.0000 ug | ORAL_TABLET | ORAL | Status: DC | PRN
  Administered 2019-02-12: 09:00:00 25 ug via VAGINAL

## 2019-02-12 MED ORDER — FENTANYL-BUPIVACAINE-NACL 0.5-0.125-0.9 MG/250ML-% EP SOLN
12.0000 mL/h | EPIDURAL | Status: DC | PRN
  Administered 2019-02-13 (×2): 12 mL/h via EPIDURAL
  Filled 2019-02-12 (×3): qty 250

## 2019-02-12 MED ORDER — MISOPROSTOL 25 MCG QUARTER TABLET
ORAL_TABLET | ORAL | Status: AC
  Filled 2019-02-12: qty 1

## 2019-02-12 MED ORDER — OXYTOCIN BOLUS FROM INFUSION
500.0000 mL | Freq: Once | INTRAVENOUS | Status: AC
  Administered 2019-02-14: 01:00:00 500 mL via INTRAVENOUS

## 2019-02-12 MED ORDER — OXYCODONE-ACETAMINOPHEN 5-325 MG PO TABS
2.0000 | ORAL_TABLET | ORAL | Status: DC | PRN
  Administered 2019-02-14: 04:00:00 2 via ORAL
  Filled 2019-02-12: qty 2

## 2019-02-12 MED ORDER — FENTANYL CITRATE (PF) 100 MCG/2ML IJ SOLN
100.0000 ug | INTRAMUSCULAR | Status: DC | PRN
  Administered 2019-02-12 – 2019-02-14 (×3): 100 ug via INTRAVENOUS
  Filled 2019-02-12 (×3): qty 2

## 2019-02-12 MED ORDER — ONDANSETRON HCL 4 MG/2ML IJ SOLN
4.0000 mg | Freq: Four times a day (QID) | INTRAMUSCULAR | Status: DC | PRN
  Administered 2019-02-13: 14:00:00 4 mg via INTRAVENOUS
  Filled 2019-02-12: qty 2

## 2019-02-12 MED ORDER — SODIUM CHLORIDE (PF) 0.9 % IJ SOLN
INTRAMUSCULAR | Status: DC | PRN
  Administered 2019-02-12: 14 mL/h via EPIDURAL

## 2019-02-12 MED ORDER — OXYCODONE-ACETAMINOPHEN 5-325 MG PO TABS: 1.0000 | ORAL_TABLET | ORAL | Status: DC | PRN

## 2019-02-12 MED ORDER — TERBUTALINE SULFATE 1 MG/ML IJ SOLN: 0.2500 mg | Freq: Once | INTRAMUSCULAR | Status: DC | PRN

## 2019-02-12 MED ORDER — EPHEDRINE 5 MG/ML INJ
10.0000 mg | INTRAVENOUS | Status: DC | PRN
  Filled 2019-02-12: qty 2

## 2019-02-12 MED ORDER — DIPHENHYDRAMINE HCL 50 MG/ML IJ SOLN: 12.5000 mg | INTRAMUSCULAR | Status: DC | PRN

## 2019-02-12 MED ORDER — ACETAMINOPHEN 325 MG PO TABS: 650.0000 mg | ORAL_TABLET | ORAL | Status: DC | PRN

## 2019-02-12 MED ORDER — PHENYLEPHRINE 40 MCG/ML (10ML) SYRINGE FOR IV PUSH (FOR BLOOD PRESSURE SUPPORT)
80.0000 ug | PREFILLED_SYRINGE | INTRAVENOUS | Status: DC | PRN
  Filled 2019-02-12: qty 10

## 2019-02-12 MED ORDER — LACTATED RINGERS IV SOLN
500.0000 mL | Freq: Once | INTRAVENOUS | Status: AC
  Administered 2019-02-12: 21:00:00 500 mL via INTRAVENOUS

## 2019-02-12 MED ORDER — PHENYLEPHRINE 40 MCG/ML (10ML) SYRINGE FOR IV PUSH (FOR BLOOD PRESSURE SUPPORT)
80.0000 ug | PREFILLED_SYRINGE | INTRAVENOUS | Status: DC | PRN
  Filled 2019-02-12 (×2): qty 10

## 2019-02-12 MED ORDER — LACTATED RINGERS IV SOLN
INTRAVENOUS | Status: DC
  Administered 2019-02-12 – 2019-02-14 (×5): via INTRAVENOUS

## 2019-02-12 MED ORDER — SOD CITRATE-CITRIC ACID 500-334 MG/5ML PO SOLN: 30.0000 mL | ORAL | Status: DC | PRN

## 2019-02-12 MED ORDER — LIDOCAINE HCL (PF) 1 % IJ SOLN
30.0000 mL | INTRAMUSCULAR | Status: AC | PRN
  Administered 2019-02-14: 01:00:00 30 mL via SUBCUTANEOUS
  Filled 2019-02-12: qty 30

## 2019-02-12 MED ORDER — LIDOCAINE HCL (PF) 1 % IJ SOLN
INTRAMUSCULAR | Status: DC | PRN
  Administered 2019-02-12: 11 mL via EPIDURAL

## 2019-02-12 MED ORDER — LACTATED RINGERS IV SOLN
500.0000 mL | INTRAVENOUS | Status: DC | PRN
  Administered 2019-02-13: 06:00:00 500 mL via INTRAVENOUS

## 2019-02-12 NOTE — Anesthesia Procedure Notes (Signed)
Epidural Patient location during procedure: OB Start time: 02/12/2019 9:25 PM End time: 02/12/2019 9:36 PM  Staffing Anesthesiologist: Lowella Curb, MD Performed: anesthesiologist   Preanesthetic Checklist Completed: patient identified, site marked, surgical consent, pre-op evaluation, timeout performed, IV checked, risks and benefits discussed and monitors and equipment checked  Epidural Patient position: sitting Prep: ChloraPrep Patient monitoring: heart rate, cardiac monitor, continuous pulse ox and blood pressure Approach: midline Location: L2-L3 Injection technique: LOR saline  Needle:  Needle type: Tuohy  Needle gauge: 17 G Needle length: 9 cm Needle insertion depth: 6 cm Catheter type: closed end flexible Catheter size: 20 Guage Catheter at skin depth: 10 cm Test dose: negative  Assessment Events: blood not aspirated, injection not painful, no injection resistance, negative IV test and no paresthesia  Additional Notes Reason for block:procedure for pain

## 2019-02-12 NOTE — Anesthesia Preprocedure Evaluation (Signed)
Anesthesia Evaluation    Airway Mallampati: II  TM Distance: >3 FB Neck ROM: Full    Dental no notable dental hx.    Pulmonary former smoker,    Pulmonary exam normal breath sounds clear to auscultation       Cardiovascular hypertension, Pt. on medications Normal cardiovascular exam Rhythm:Regular Rate:Normal     Neuro/Psych    GI/Hepatic   Endo/Other    Renal/GU      Musculoskeletal   Abdominal (+) + obese,   Peds  Hematology   Anesthesia Other Findings   Reproductive/Obstetrics (+) Pregnancy                             Anesthesia Physical Anesthesia Plan  ASA: II  Anesthesia Plan: Epidural   Post-op Pain Management:    Induction:   PONV Risk Score and Plan:   Airway Management Planned:   Additional Equipment:   Intra-op Plan:   Post-operative Plan:   Informed Consent:   Plan Discussed with:   Anesthesia Plan Comments:         Anesthesia Quick Evaluation

## 2019-02-12 NOTE — Progress Notes (Signed)
OB/GYN Faculty Practice: Labor Progress Note  Subjective: Doing well, somewhat uncomfortable during contractions. Told RN she feels like she's just getting used to them more.   Objective: BP (!) 143/84   Pulse 75   Temp 98.3 F (36.8 C) (Oral)   Resp 16   Ht 5\' 7"  (1.702 m)   Wt 102.1 kg   LMP 06/05/2018   BMI 35.24 kg/m  Gen: well-appearing, NAD Dilation: 1 Effacement (%): 50 Cervical Position: Middle Station: -2 Presentation: Vertex Exam by:: Clide Remmers  Assessment and Plan: Kathryn Pena is a 18 y.o. G1P0 at [redacted]w[redacted]d here for IOL for gHTN.  Labor: Continuing to contract every 1-3 minutes since first dose of cytotec, mildly uncomfortable during contractions. Discussed placement of FB now that cervix 1cm, patient amenable. FB placed with 60cc of fluid without complication. Will defer additional cytotec at this time, consider placing if contractions space out or could switch to pitocin. Cervix still tight 1 so would benefit from cytotec.  -- pain control: open to options   Fetal Wellbeing: EFW 90% (3392g at 36w4). Cephalic by sutures on RN check.  -- GBS (negative) -- continuous fetal monitoring - category I   Gestational HTN: UPC 0.11, plt, AST/ALT, Cr all within normal limits. Asymptomatic at start of induction. BP normal to moderate range. -- continue to monitor closely   Elliet Goodnow S. Earlene Plater, DO OB/GYN Fellow, Faculty Practice  5:28 PM

## 2019-02-12 NOTE — H&P (Signed)
OBSTETRIC ADMISSION HISTORY AND PHYSICAL  *Late entry of H&P despite induction starting at 0900 because of other deliveries on unit - plan of care discussed with RN prior to starting induction.*  Lynnell ChadJaniyah A Fredricksen is a 18 y.o. female G1P0 with IUP at 315w0d by L/7 presenting for induction of labor for gestational hypertension. Denies headaches, vision changes, abdominal pain, shortness of breath today.   Reports fetal movement. Denies vaginal bleeding, leakage of fluids.  She received her prenatal care at [facility].  Support person in labor: FOB Ernst Bowlerhris  Ultrasounds . 7w6: viability U/S . 19w2: normal anatomy U/S, EFW 45%  Prenatal History/Complications: . Gestational hypertension . Teen pregnancy  Past Medical History: Past Medical History:  Diagnosis Date  . Pregnancy induced hypertension     Past Surgical History: Past Surgical History:  Procedure Laterality Date  . NO PAST SURGERIES      Obstetrical History: OB History    Gravida  1   Para      Term      Preterm      AB      Living        SAB      TAB      Ectopic      Multiple      Live Births              Social History: Social History   Socioeconomic History  . Marital status: Single    Spouse name: Not on file  . Number of children: Not on file  . Years of education: Not on file  . Highest education level: Not on file  Occupational History  . Not on file  Social Needs  . Financial resource strain: Not hard at all  . Food insecurity:    Worry: Never true    Inability: Never true  . Transportation needs:    Medical: No    Non-medical: Not on file  Tobacco Use  . Smoking status: Former Smoker    Types: Cigars    Last attempt to quit: 06/27/2018    Years since quitting: 0.6  . Smokeless tobacco: Never Used  Substance and Sexual Activity  . Alcohol use: No  . Drug use: No  . Sexual activity: Yes    Birth control/protection: None  Lifestyle  . Physical activity:    Days per  week: Not on file    Minutes per session: Not on file  . Stress: Only a little  Relationships  . Social connections:    Talks on phone: Not on file    Gets together: Not on file    Attends religious service: Not on file    Active member of club or organization: Not on file    Attends meetings of clubs or organizations: Not on file    Relationship status: Not on file  Other Topics Concern  . Not on file  Social History Narrative  . Not on file    Family History: Family History  Problem Relation Age of Onset  . Healthy Mother     Allergies: No Known Allergies  Medications Prior to Admission  Medication Sig Dispense Refill Last Dose  . acetaminophen (TYLENOL) 500 MG tablet Take 1,000 mg by mouth every 6 (six) hours as needed for headache (pain).   Past Month at Unknown time  . Prenat w/o A-FeCbn-Meth-FA-DHA (PRENATE MINI) 29-0.6-0.4-350 MG CAPS Take 1 capsule by mouth daily. 30 capsule 11 02/11/2019 at Unknown time  . Doxylamine-Pyridoxine 10-10 MG  TBEC Take 2 tablets by mouth at bedtime. May also take 1 tab in am and 1 tab in afternoon (Patient not taking: Reported on 02/02/2019) 100 tablet 0 Not Taking at Unknown time  . ondansetron (ZOFRAN) 4 MG tablet Take 1 tablet (4 mg total) by mouth every 6 (six) hours as needed for nausea or vomiting. (Patient not taking: Reported on 02/02/2019) 12 tablet 0 Not Taking at Unknown time     Review of Systems  All systems reviewed and negative except as stated in HPI  Blood pressure 123/67, pulse 83, temperature 98.9 F (37.2 C), temperature source Oral, resp. rate 16, height  (1.702 m), weight 102.1 kg, last menstrual period 06/05/2018. General appearance: alert, well-appearing, NAD Lungs: no respiratory distress Heart: regular rate  Abdomen: soft, non-tender; gravid  Pelvic: deferred Extremities: 2+ bilateral pitting edema  Presentation: cephalic by sutures on RN check Fetal monitoring: 130s/mod/+a/-d Uterine activity: irregular,  irritability  Dilation: Fingertip Effacement (%): Thick Station: -2 Exam by:: k fields, rn  Prenatal labs: ABO, Rh: --/--/O POS, O POS Performed at [facility], [address], [place], [place] [address]  [id number]) Antibody: NEG (05/02 0827) Rubella: 12.50 (11/05 1456) RPR: Non Reactive (02/19 1035)  HBsAg: Negative (11/05 1456)  HIV: Non Reactive (02/19 1035)  GBS: Negative (04/22 0507)  Glucola: normal 2-hr GTT Genetic screening:  Low risk NIPS  screen negative AFP  Prenatal Transfer Tool  Maternal Diabetes: No Genetic Screening: Normal Maternal Ultrasounds/Referrals: Normal Fetal Ultrasounds or other Referrals:  None Maternal Substance Abuse:  No Significant Maternal Medications:  None Significant Maternal Lab Results: None  Results for orders placed or performed during the hospital encounter of 02/12/19 (from the past 24 hour(s))  Protein / creatinine ratio, urine   Collection Time: 02/12/19  7:48 AM  Result Value Ref Range   Creatinine, Urine 52.60 mg/dL   Total Protein, Urine 6 mg/dL   Protein Creatinine Ratio 0.11 0.00 - 0.15 mg/mg[Cre]  Comprehensive metabolic panel   Collection Time: 02/12/19  8:27 AM  Result Value Ref Range   Sodium 140 135 - 145 mmol/L   Potassium 3.4 (L) 3.5 - 5.1 mmol/L   Chloride 110 98 - 111 mmol/L   CO2 21 (L) 22 - 32 mmol/L   Glucose, Bld 75 70 - 99 mg/dL   BUN <5 (L) 6 - 20 mg/dL   Creatinine, Ser 4.09 0.44 - 1.00 mg/dL   Calcium 8.8 (L) 8.9 - 10.3 mg/dL   Total Protein 5.4 (L) 6.5 - 8.1 g/dL   Albumin 2.7 (L) 3.5 - 5.0 g/dL   AST 18 15 - 41 U/L   ALT 15 0 - 44 U/L   Alkaline Phosphatase 100 38 - 126 U/L   Total Bilirubin 0.3 0.3 - 1.2 mg/dL   GFR calc non Af Amer >60 >60 mL/min   GFR calc Af Amer >60 >60 mL/min   Anion gap 9 5 - 15  CBC   Collection Time: 02/12/19  8:27 AM  Result Value Ref Range   WBC 5.4 4.0 - 10.5 K/uL   RBC 3.58 (L) 3.87 - 5.11 MIL/uL   Hemoglobin 10.2 (L) 12.0 - 15.0 g/dL   HCT 81.1 (L)  91.4 - 46.0 %   MCV 89.4 80.0 - 100.0 fL   MCH 28.5 26.0 - 34.0 pg   MCHC 31.9 30.0 - 36.0 g/dL   RDW 78.2 95.6 - 21.3 %   Platelets 197 150 - 400 K/uL   nRBC  0.0 0.0 - 0.2 %  Type and screen [facility]   Collection Time: 02/12/19  8:27 AM  Result Value Ref Range   ABO/RH(D) O POS    Antibody Screen NEG    Sample Expiration      02/15/2019 Performed at [facility], [address], [place], [place] [address]   ABO/Rh   Collection Time: 02/12/19  8:27 AM  Result Value Ref Range   ABO/RH(D)      O POS Performed at [facility], [address], [place], [place] [address]     Patient Active Problem List   Diagnosis Date Noted  . Gestational hypertension 02/12/2019  . Gestational (pregnancy-induced) hypertension without significant proteinuria, third trimester 01/19/2019  . Supervision of normal pregnancy, antepartum 08/17/2018    Assessment/Plan:  LOVELLA KUSLER is a 18 y.o. G1P0 at [redacted]w[redacted]d here for IOL for gHTN.  Labor: Induction to start with cytotec. Will try to place FB at next check if able, otherwise continue with cytotec for ripening.  Anticipate SVD. Counseled patient of typical length of induction process.  -- pain control: open to options   Fetal Wellbeing: EFW 90% (3392g at 36w4). Cephalic by sutures on RN check.  -- GBS (negative) -- continuous fetal monitoring - category I   Gestational HTN: UPC 0.11, plt, AST/ALT, Cr all within normal limits. Asymptomatic at start of induction. BP normal to moderate range. -- continue to monitor closely   Postpartum Planning -- breast/post-placental IUD -- RI/[declined]Tdap/[x] flu   Temple Ewart S. Earlene Plater, DO OB/GYN Fellow

## 2019-02-12 NOTE — Progress Notes (Signed)
OB/GYN Faculty Practice: Labor Progress Note  Subjective: Doing well, FB still in place. Some contraction series are getting quite uncomfortable, got some IV pain medicine. Into room to discuss change of shift and Dr. Sheran Fava to assume care at signout.   Objective: BP (!) 153/96   Pulse 74   Temp 98.7 F (37.1 C) (Axillary)   Resp 18   Ht 5\' 7"  (1.702 m)   Wt 102.1 kg   LMP 06/05/2018   BMI 35.24 kg/m  Gen: tired, uncomfortable appearing  Dilation: 1 Effacement (%): 50 Cervical Position: Middle Station: -2 Presentation: Vertex Exam by:: Damisha Wolff  Assessment and Plan: Kathryn Pena is a 18 y.o. G1P0 at [redacted]w[redacted]d here for IOL for gHTN.  Labor: FB still in place. Would still defer cytotec at this time given contraction frequency and discomfort. Night team to reassess.  -- pain control: open to options   Fetal Wellbeing: EFW 90% (3392g at 36w4). Cephalic by sutures on RN check.  -- GBS (negative) -- continuous fetal monitoring - category I   Gestational HTN: UPC 0.11, plt, AST/ALT, Cr all within normal limits. Asymptomatic at start of induction. BP recently in moderate range, 1 severe but better on recheck.  -- continue to monitor closely   Kyandre Okray S. Earlene Plater, DO OB/GYN Fellow, Faculty Practice  7:59 PM

## 2019-02-13 LAB — COMPREHENSIVE METABOLIC PANEL
ALT: 12 U/L (ref 0–44)
ALT: 14 U/L (ref 0–44)
AST: 19 U/L (ref 15–41)
AST: 25 U/L (ref 15–41)
Albumin: 2.4 g/dL — ABNORMAL LOW (ref 3.5–5.0)
Albumin: 2.7 g/dL — ABNORMAL LOW (ref 3.5–5.0)
Alkaline Phosphatase: 97 U/L (ref 38–126)
Alkaline Phosphatase: 122 U/L (ref 38–126)
Anion gap: 7 (ref 5–15)
Anion gap: 14 (ref 5–15)
BUN: 6 mg/dL (ref 6–20)
BUN: 9 mg/dL (ref 6–20)
CO2: 23 mmol/L (ref 22–32)
CO2: 18 mmol/L — ABNORMAL LOW (ref 22–32)
Calcium: 8.5 mg/dL — ABNORMAL LOW (ref 8.9–10.3)
Calcium: 8.5 mg/dL — ABNORMAL LOW (ref 8.9–10.3)
Chloride: 108 mmol/L (ref 98–111)
Chloride: 99 mmol/L (ref 98–111)
Creatinine, Ser: 1.32 mg/dL — ABNORMAL HIGH (ref 0.44–1.00)
Creatinine, Ser: 2.03 mg/dL — ABNORMAL HIGH (ref 0.44–1.00)
GFR calc Af Amer: >60 mL/min (ref >60)
GFR calc Af Amer: 40 mL/min — ABNORMAL LOW (ref >60)
GFR calc non Af Amer: 58 mL/min — ABNORMAL LOW (ref >60)
GFR calc non Af Amer: 35 mL/min — ABNORMAL LOW (ref >60)
Glucose, Bld: 80 mg/dL (ref 70–99)
Glucose, Bld: 92 mg/dL (ref 70–99)
Potassium: 3.5 mmol/L (ref 3.5–5.1)
Potassium: 3 mmol/L — ABNORMAL LOW (ref 3.5–5.1)
Sodium: 138 mmol/L (ref 135–145)
Sodium: 131 mmol/L — ABNORMAL LOW (ref 135–145)
Total Bilirubin: 0.9 mg/dL (ref 0.3–1.2)
Total Bilirubin: 0.8 mg/dL (ref 0.3–1.2)
Total Protein: 5.5 g/dL — ABNORMAL LOW (ref 6.5–8.1)
Total Protein: 6.1 g/dL — ABNORMAL LOW (ref 6.5–8.1)

## 2019-02-13 LAB — CBC
HCT: 32.2 % — ABNORMAL LOW (ref 36.0–46.0)
HCT: 35.5 % — ABNORMAL LOW (ref 36.0–46.0)
Hemoglobin: 10.7 g/dL — ABNORMAL LOW (ref 12.0–15.0)
Hemoglobin: 11.6 g/dL — ABNORMAL LOW (ref 12.0–15.0)
MCH: 29.2 pg (ref 26.0–34.0)
MCH: 29.2 pg (ref 26.0–34.0)
MCHC: 33.2 g/dL (ref 30.0–36.0)
MCHC: 32.7 g/dL (ref 30.0–36.0)
MCV: 87.7 fL (ref 80.0–100.0)
MCV: 89.4 fL (ref 80.0–100.0)
Platelets: 173 10*3/uL (ref 150–400)
Platelets: 196 10*3/uL (ref 150–400)
RBC: 3.67 MIL/uL — ABNORMAL LOW (ref 3.87–5.11)
RBC: 3.97 MIL/uL (ref 3.87–5.11)
RDW: 14.5 % (ref 11.5–15.5)
RDW: 14.6 % (ref 11.5–15.5)
WBC: 8.3 10*3/uL (ref 4.0–10.5)
WBC: 13.6 10*3/uL — ABNORMAL HIGH (ref 4.0–10.5)
nRBC: 0 % (ref 0.0–0.2)
nRBC: 0 % (ref 0.0–0.2)

## 2019-02-13 LAB — RPR: RPR Ser Ql: NONREACTIVE

## 2019-02-13 LAB — MAGNESIUM: Magnesium: 4.6 mg/dL — ABNORMAL HIGH (ref 1.7–2.4)

## 2019-02-13 MED ORDER — MAGNESIUM SULFATE 40 G IN LACTATED RINGERS - SIMPLE
2.0000 g/h | INTRAVENOUS | Status: AC
  Filled 2019-02-13 (×2): qty 500

## 2019-02-13 MED ORDER — FUROSEMIDE 10 MG/ML IJ SOLN
20.0000 mg | Freq: Once | INTRAMUSCULAR | Status: AC
  Administered 2019-02-13: 17:00:00 20 mg via INTRAVENOUS
  Filled 2019-02-13: qty 2

## 2019-02-13 MED ORDER — HYDRALAZINE HCL 20 MG/ML IJ SOLN: 10.0000 mg | INTRAMUSCULAR | Status: DC | PRN

## 2019-02-13 MED ORDER — OXYTOCIN 40 UNITS IN NORMAL SALINE INFUSION - SIMPLE MED
1.0000 m[IU]/min | INTRAVENOUS | Status: DC
  Administered 2019-02-13 (×2): 2 m[IU]/min via INTRAVENOUS

## 2019-02-13 MED ORDER — LABETALOL HCL 5 MG/ML IV SOLN: 80.0000 mg | INTRAVENOUS | Status: DC | PRN

## 2019-02-13 MED ORDER — MAGNESIUM SULFATE BOLUS VIA INFUSION
4.0000 g | Freq: Once | INTRAVENOUS | Status: AC
  Administered 2019-02-13: 4 g via INTRAVENOUS
  Filled 2019-02-13: qty 500

## 2019-02-13 MED ORDER — TERBUTALINE SULFATE 1 MG/ML IJ SOLN: 0.2500 mg | Freq: Once | INTRAMUSCULAR | Status: DC | PRN

## 2019-02-13 MED ORDER — LABETALOL HCL 5 MG/ML IV SOLN: 40.0000 mg | INTRAVENOUS | Status: DC | PRN

## 2019-02-13 MED ORDER — LABETALOL HCL 5 MG/ML IV SOLN: 20.0000 mg | INTRAVENOUS | Status: DC | PRN

## 2019-02-13 MED ORDER — LEVONORGESTREL 19.5 MCG/DAY IU IUD
INTRAUTERINE_SYSTEM | Freq: Once | INTRAUTERINE | Status: AC
  Administered 2019-02-14: 1 via INTRAUTERINE
  Filled 2019-02-13: qty 1

## 2019-02-13 NOTE — Progress Notes (Signed)
OB/GYN Faculty Practice: Labor Progress Note  Subjective: Doing well, comfortable with epidural. Denies headaches, vision changes. Was able to get a nap.   Objective: BP 122/75   Pulse 78   Temp 98.2 F (36.8 C) (Oral)   Resp 16   Ht 5\' 7"  (1.702 m)   Wt 102.1 kg   LMP 06/05/2018   BMI 35.24 kg/m  Gen: well-appearing, using peanut ball Dilation: 4.5 Effacement (%): 70 Cervical Position: Middle Station: -2, Ballotable Presentation: Vertex Exam by:: Gwendolyn Grant, RN   Assessment and Plan: Kathryn A McIntyreis a 18 y.o.G1P0 at [redacted]w[redacted]d here for IOL for gHTN.  Labor:FB came out last night, was still contracting regularly but minimal change so started pitocin around midnight. Now contracting nearly every minute. Will keep pitocin around 10 then plan to recheck in a couple of hours and hopefully will be able to AROM and/or continue to titrate pitocin if unchanged.   -- pain control:epidural  Fetal Wellbeing: EFW90% (3392g at 36w4). Cephalic bysutures on RN check.  -- GBS (negative) -- continuous fetal monitoring -category I   Gestational HTN: UPC 0.11, plt, AST/ALT, Cr all within normal limits. Asymptomatic. BP normal to moderate range.  -- continue to monitor closely  Kathryn Beske S. Earlene Plater, DO OB/GYN Fellow, Faculty Practice  9:37 AM

## 2019-02-13 NOTE — Progress Notes (Addendum)
Interval Progress Note  Plan of care discussed with RN and updated Dr. Alysia Penna re: new diagnosis of severe preeclampsia, unchanged cervical exam, increased uterine tone and periods of minimal variability since AROM. Will take pitocin break, has been about 17 hours and no change. Could consider cytotec in 4 hours but will reassess strip in 2 hours. Continue Mg++. No urine output for last hour, doubt obstructive as FB not felt on check and head not low station  Acute renal failure with rising serum Cr and anuria during labor due to pre-eclampsia. Will give IV Lasix 20mg  once and continue to monitor closely.   Kathryn Deer. Earlene Plater, DO OB/GYN Fellow

## 2019-02-13 NOTE — Progress Notes (Signed)
LABOR PROGRESS NOTE  KARELIN DOCKUM is a 18 y.o. G1P0 at [redacted]w[redacted]d  admitted for IOL fort GHTN, now severe preeclampsia on magnesium   Subjective: Patient doing well, comfortable with epidural at this time   Objective: BP 128/80   Pulse 80   Temp 98.9 F (37.2 C) (Axillary)   Resp 16   Ht 5\' 7"  (1.702 m)   Wt 102.1 kg   LMP 06/05/2018   BMI 35.24 kg/m  or  Vitals:   02/13/19 1930 02/13/19 2000 02/13/19 2037 02/13/19 2107  BP: 137/88 (!) 141/89 128/80   Pulse: 83 (!) 105 80   Resp: 16 16 16    Temp:    98.9 F (37.2 C)  TempSrc:    Axillary  Weight:      Height:        Pitocin break at 1700, currently not on pitocin FSE placed  Dilation: 9 Effacement (%): 90 Cervical Position: Middle Station: Plus 1 Presentation: Vertex Exam by:: Steward Drone, CNM  FHT: baseline rate 120, minimal to moderate varibility, +accel, no decel Toco: 4-5 MVU: 80-90, not on pitocin   Labs: Lab Results  Component Value Date   WBC 8.3 02/13/2019   HGB 10.7 (L) 02/13/2019   HCT 32.2 (L) 02/13/2019   MCV 87.7 02/13/2019   PLT 173 02/13/2019    Patient Active Problem List   Diagnosis Date Noted  . Gestational hypertension 02/12/2019  . Gestational (pregnancy-induced) hypertension without significant proteinuria, third trimester 01/19/2019  . Supervision of normal pregnancy, antepartum 08/17/2018    Assessment / Plan: 18 y.o. G1P0 at [redacted]w[redacted]d here for IOL for GHTN turned Severe PEC  Labor: Progressing well on own, will keep pitocin off at this time and reassess cervical change in 2 hours or as needed based on FHR or maternal urge to push  Fetal Wellbeing:  Cat II  Pain Control:  Epidural  Anticipated MOD:  SVD  Sharyon Cable, CNM 02/13/2019, 9:11 PM

## 2019-02-13 NOTE — Progress Notes (Signed)
OB/GYN Faculty Practice: Labor Progress Note  Subjective: Doing well, tired, no specific complaints.   Objective: BP (!) 128/102   Pulse 91   Temp 98.2 F (36.8 C) (Oral)   Resp 16   Ht 5\' 7"  (1.702 m)   Wt 102.1 kg   LMP 06/05/2018   BMI 35.24 kg/m  Gen: well-appearing, NAD LE: 3+ pitting edema bilaterally  Dilation: 3.5 Effacement (%): 70 Cervical Position: Middle Station: -2 Presentation: Vertex Exam by:: dr Nycere Presley  Assessment and Plan: Kathryn Pena a 18 y.o.G1P0 at [redacted]w[redacted]d here for IOL for gHTN.  Labor:No change in cervical exam today, actually likely more like 3-4cm than prior report of 4-5cm. Discussed risks/benefits of AROM with patient who is in agreement. AROM clear fluid. IUPC placed to help with titrating pitocin.  Has been on pitocin for 16 hours, may consider pitocin break this evening if no change but want to ensure adequate contractions first.  -- pain control:epidural  Fetal Wellbeing: EFW90% (3392g at 36w4). Cephalic bysutures on RN check.  -- GBS (negative) -- continuous fetal monitoring -category I   Gestational HTN>Severe Preeclampsia (Cr): UPC 0.11. BP still normal to moderate range. Repeated PIH labs because of decreased UOP and Cr now elevated to severe range. Discussed risks/benefits of magnesium with patient, she is in agreement. -- Mg++ -- repeat PIH labs including Mg++ level at 2200 -- continue to monitor closely  [place] S. Earlene Plater, DO OB/GYN Fellow, Faculty Practice  4:15 PM

## 2019-02-13 NOTE — Progress Notes (Signed)
Interval Progress Note  Plan of care discussed with RN. Category I strip, still contracting nearly every minute. Has been on stable dose of pitocin for last several hours. Will plan to recheck within the hour. Hopefully descent of fetal station and will be able to AROM with next check. Given now 3+ pitting edema extending into subcutaneous space of abdomen, will decrease total fluids rate to 65ml/hr. Additionally, UOP starting to decrease slightly but still clear so more likely to be obstructive. Consider repeating Cr this afternoon if UOP does not increase.   Cristal Deer. Earlene Plater, DO OB/GYN Fellow

## 2019-02-13 NOTE — Progress Notes (Signed)
LABOR PROGRESS NOTE  Kathryn Pena is a 18 y.o. G1P0 at 108w1d  admitted for IOL for gHTN.   Subjective: Comfortable with epidural. Reports contractions had been really strong and regular prior to epidural placement.   Objective: BP 133/87   Pulse 68   Temp 98.1 F (36.7 C) (Oral)   Resp 18   Ht 5\' 7"  (1.702 m)   Wt 102.1 kg   LMP 06/05/2018   BMI 35.24 kg/m  or  Vitals:   02/12/19 2311 02/12/19 2322 02/12/19 2331 02/13/19 0001  BP: 139/88  (!) 145/88 133/87  Pulse: 67  68 68  Resp: 16  18   Temp:  98.1 F (36.7 C)    TempSrc:  Oral    Weight:      Height:        Dilation: 4 Effacement (%): 70 Cervical Position: Middle Station: -2 Presentation: Vertex Exam by:: Dr. Earlene Plater  FHT: baseline rate 120, moderate varibility, +acel, NO decel Toco: Irregular   Labs: Lab Results  Component Value Date   WBC 6.3 02/12/2019   HGB 10.7 (L) 02/12/2019   HCT 33.3 (L) 02/12/2019   MCV 88.8 02/12/2019   PLT 191 02/12/2019    Patient Active Problem List   Diagnosis Date Noted  . Gestational hypertension 02/12/2019  . Gestational (pregnancy-induced) hypertension without significant proteinuria, third trimester 01/19/2019  . Supervision of normal pregnancy, antepartum 08/17/2018    Assessment / Plan: 18 y.o. G1P0 at [redacted]w[redacted]d here for IOL for gHTN. BPs normotensive to moderately elevated, asymptomatic.   Labor: Patient s/p cytotec and FB. When FB came out patient was contracting very regularly. Contractions have spaced out and recheck 2 hours later cervix essentially unchanged. Will start Pitocin 2x2 and titrate as appropriate.  Fetal Wellbeing:  Cat I  Pain Control:  Epidural upon maternal request  Anticipated MOD:  NSVD   Marcy Siren, D.O. OB Fellow  02/13/2019, 12:12 AM

## 2019-02-13 NOTE — Progress Notes (Signed)
LABOR PROGRESS NOTE  Kathryn Pena is a 18 y.o. G1P0 at [redacted]w[redacted]d  admitted for IOL for gHTN.   Subjective: Strip note.   Objective: BP 125/80   Pulse 70   Temp 98.8 F (37.1 C) (Axillary)   Resp 18   Ht 5\' 7"  (1.702 m)   Wt 102.1 kg   LMP 06/05/2018   BMI 35.24 kg/m  or  Vitals:   02/13/19 0201 02/13/19 0231 02/13/19 0300 02/13/19 0331  BP: 137/86 (!) 118/59 138/77 125/80  Pulse: 72 68 82 70  Resp: 16 16 16 18   Temp:   98.8 F (37.1 C)   TempSrc:   Axillary   Weight:      Height:        Dilation: 4 Effacement (%): 70 Cervical Position: Middle Station: Ballotable Presentation: Vertex Exam by:: Gwendolyn Grant, RN  FHT: baseline rate 120, moderate varibility, +acel, no decel Toco: q1-3 min   Labs: Lab Results  Component Value Date   WBC 6.3 02/12/2019   HGB 10.7 (L) 02/12/2019   HCT 33.3 (L) 02/12/2019   MCV 88.8 02/12/2019   PLT 191 02/12/2019    Patient Active Problem List   Diagnosis Date Noted  . Gestational hypertension 02/12/2019  . Gestational (pregnancy-induced) hypertension without significant proteinuria, third trimester 01/19/2019  . Supervision of normal pregnancy, antepartum 08/17/2018    Assessment / Plan: 18 y.o. G1P0 at [redacted]w[redacted]d here for IOL for gHTN.   Labor: Patient s/p cytotec and FB. Ctx now more regular with Pitocin, currently at 8 mu/min.  Fetal Wellbeing:  Cat I  Pain Control:  Epidural in place  Anticipated MOD:  NSVD   Marcy Siren, D.O. OB Fellow  02/13/2019, 3:51 AM

## 2019-02-14 ENCOUNTER — Encounter (HOSPITAL_COMMUNITY): Payer: Self-pay

## 2019-02-14 DIAGNOSIS — O1410 Severe pre-eclampsia, unspecified trimester: Secondary | ICD-10-CM | POA: Diagnosis not present

## 2019-02-14 DIAGNOSIS — Z3A37 37 weeks gestation of pregnancy: Secondary | ICD-10-CM

## 2019-02-14 DIAGNOSIS — Z30430 Encounter for insertion of intrauterine contraceptive device: Secondary | ICD-10-CM

## 2019-02-14 DIAGNOSIS — Z30017 Encounter for initial prescription of implantable subdermal contraceptive: Secondary | ICD-10-CM

## 2019-02-14 DIAGNOSIS — O1414 Severe pre-eclampsia complicating childbirth: Secondary | ICD-10-CM

## 2019-02-14 LAB — CBC
HCT: 36.1 % (ref 36.0–46.0)
Hemoglobin: 11.9 g/dL — ABNORMAL LOW (ref 12.0–15.0)
MCH: 28.9 pg (ref 26.0–34.0)
MCHC: 33 g/dL (ref 30.0–36.0)
MCV: 87.6 fL (ref 80.0–100.0)
Platelets: 201 10*3/uL (ref 150–400)
RBC: 4.12 MIL/uL (ref 3.87–5.11)
RDW: 14.4 % (ref 11.5–15.5)
WBC: 17.2 10*3/uL — ABNORMAL HIGH (ref 4.0–10.5)
nRBC: 0 % (ref 0.0–0.2)

## 2019-02-14 LAB — COMPREHENSIVE METABOLIC PANEL
ALT: 14 U/L (ref 0–44)
AST: 28 U/L (ref 15–41)
Albumin: 2.3 g/dL — ABNORMAL LOW (ref 3.5–5.0)
Alkaline Phosphatase: 106 U/L (ref 38–126)
Anion gap: 9 (ref 5–15)
BUN: 8 mg/dL (ref 6–20)
CO2: 23 mmol/L (ref 22–32)
Calcium: 8.2 mg/dL — ABNORMAL LOW (ref 8.9–10.3)
Chloride: 104 mmol/L (ref 98–111)
Creatinine, Ser: 1.72 mg/dL — ABNORMAL HIGH (ref 0.44–1.00)
GFR calc Af Amer: 49 mL/min — ABNORMAL LOW (ref >60)
GFR calc non Af Amer: 42 mL/min — ABNORMAL LOW (ref >60)
Glucose, Bld: 109 mg/dL — ABNORMAL HIGH (ref 70–99)
Potassium: 3.1 mmol/L — ABNORMAL LOW (ref 3.5–5.1)
Sodium: 136 mmol/L (ref 135–145)
Total Bilirubin: 0.4 mg/dL (ref 0.3–1.2)
Total Protein: 5.4 g/dL — ABNORMAL LOW (ref 6.5–8.1)

## 2019-02-14 LAB — MAGNESIUM
Magnesium: 5.5 mg/dL — ABNORMAL HIGH (ref 1.7–2.4)
Magnesium: 6.7 mg/dL (ref 1.7–2.4)

## 2019-02-14 MED ORDER — BENZOCAINE-MENTHOL 20-0.5 % EX AERO
1.0000 "application " | INHALATION_SPRAY | CUTANEOUS | Status: DC | PRN
  Administered 2019-02-15: 1 via TOPICAL
  Filled 2019-02-14 (×2): qty 56

## 2019-02-14 MED ORDER — PRENATAL MULTIVITAMIN CH
1.0000 | ORAL_TABLET | Freq: Every day | ORAL | Status: DC
  Administered 2019-02-14 – 2019-02-16 (×3): 1 via ORAL
  Filled 2019-02-14 (×3): qty 1

## 2019-02-14 MED ORDER — SENNOSIDES-DOCUSATE SODIUM 8.6-50 MG PO TABS
2.0000 | ORAL_TABLET | ORAL | Status: DC
  Administered 2019-02-14 – 2019-02-16 (×2): 2 via ORAL
  Filled 2019-02-14 (×2): qty 2

## 2019-02-14 MED ORDER — DIPHENHYDRAMINE HCL 25 MG PO CAPS: 25.0000 mg | ORAL_CAPSULE | Freq: Four times a day (QID) | ORAL | Status: DC | PRN

## 2019-02-14 MED ORDER — LACTATED RINGERS IV SOLN
INTRAVENOUS | Status: DC
  Administered 2019-02-14 (×2): via INTRAVENOUS

## 2019-02-14 MED ORDER — SIMETHICONE 80 MG PO CHEW: 80.0000 mg | CHEWABLE_TABLET | ORAL | Status: DC | PRN

## 2019-02-14 MED ORDER — COCONUT OIL OIL
1.0000 "application " | TOPICAL_OIL | Status: DC | PRN
  Administered 2019-02-15: 1 via TOPICAL

## 2019-02-14 MED ORDER — TETANUS-DIPHTH-ACELL PERTUSSIS 5-2.5-18.5 LF-MCG/0.5 IM SUSP: 0.5000 mL | Freq: Once | INTRAMUSCULAR | Status: DC

## 2019-02-14 MED ORDER — ONDANSETRON HCL 4 MG/2ML IJ SOLN: 4.0000 mg | INTRAMUSCULAR | Status: DC | PRN

## 2019-02-14 MED ORDER — DIBUCAINE (PERIANAL) 1 % EX OINT: 1.0000 "application " | TOPICAL_OINTMENT | CUTANEOUS | Status: DC | PRN

## 2019-02-14 MED ORDER — ACETAMINOPHEN 325 MG PO TABS
650.0000 mg | ORAL_TABLET | ORAL | Status: DC | PRN
  Administered 2019-02-14 – 2019-02-16 (×3): 650 mg via ORAL
  Filled 2019-02-14 (×3): qty 2

## 2019-02-14 MED ORDER — WITCH HAZEL-GLYCERIN EX PADS: 1.0000 "application " | MEDICATED_PAD | CUTANEOUS | Status: DC | PRN

## 2019-02-14 MED ORDER — ZOLPIDEM TARTRATE 5 MG PO TABS: 5.0000 mg | ORAL_TABLET | Freq: Every evening | ORAL | Status: DC | PRN

## 2019-02-14 MED ORDER — ONDANSETRON HCL 4 MG PO TABS: 4.0000 mg | ORAL_TABLET | ORAL | Status: DC | PRN

## 2019-02-14 MED ORDER — IBUPROFEN 600 MG PO TABS
600.0000 mg | ORAL_TABLET | Freq: Four times a day (QID) | ORAL | Status: DC
  Administered 2019-02-14 – 2019-02-16 (×10): 600 mg via ORAL
  Filled 2019-02-14 (×10): qty 1

## 2019-02-14 NOTE — Anesthesia Postprocedure Evaluation (Signed)
Anesthesia Post Note  Patient: Kathryn Pena  Procedure(s) Performed: AN AD HOC LABOR EPIDURAL     Patient location during evaluation: Mother Baby Anesthesia Type: Epidural Level of consciousness: awake and alert Pain management: pain level controlled Vital Signs Assessment: post-procedure vital signs reviewed and stable Respiratory status: spontaneous breathing, nonlabored ventilation and respiratory function stable Cardiovascular status: stable Postop Assessment: no headache, no backache and epidural receding Anesthetic complications: no    Last Vitals:  Vitals:   02/14/19 0419 02/14/19 0436  BP: (!) 140/101 132/82  Pulse: 96 91  Resp:    Temp:      Last Pain:  Vitals:   02/14/19 0427  TempSrc:   PainSc: 5    Pain Goal:                   Dalena Plantz

## 2019-02-14 NOTE — Lactation Note (Signed)
This note was copied from a baby's chart. Lactation Consultation Note Baby 5 hrs old. Mom and baby sleepy. Baby wouldn't wake to feed.  Mom has edema to breast. Rt. Nipple flat, Lt. Nipple slightly inverted. Hand expression challenging d/t thickness of breast tissue. After a lot of breast massage and hand expression collected 1 ml colostrum. Tried to teach mom to hand express. Difficult for mom d/t BP cuff and IV. Mom was limited in mobility as well as sleepy.  Mom has hand pump to pre-pump, has shells to wear today.  There's no DEBP in rm. Asked RN to bring pump and kit to RN and set up for mom. RN Stated she would. Pre-pump nipples evert slightly.  Mom will need f/u today for latching when baby is more alert. LC feels mom may probably need NS d/t nothing for baby to latch to as well as not compressible.  Reviewed newborn behavior, STS, I&O, supply and demand. Mom will need teaching reviewed. Encouraged to call for assistance. Lactation brochure at bed side.  Patient Name: Kathryn Pena [id number] Date: 02/14/2019 Reason for consult: Initial assessment;1st time breastfeeding;Early term 37-38.6wks   Maternal Data Has patient been taught Hand Expression?: Yes Does the patient have breastfeeding experience prior to this delivery?: No  Feeding Feeding Type: Breast Milk  LATCH Score Latch: Too sleepy or reluctant, no latch achieved, no sucking elicited.     Type of Nipple: Inverted(Rt. flat, Lt. inverted slightly)  Comfort (Breast/Nipple): Soft / non-tender        Interventions Interventions: Breast feeding basics reviewed;Breast compression;Hand pump;Support pillows;Breast massage;Hand express;Expressed milk;Pre-pump if needed;Shells;Reverse pressure  Lactation Tools Discussed/Used Tools: Shells;Pump Shell Type: Inverted Breast pump type: Manual WIC Program: Yes Pump Review: Setup, frequency, and cleaning;Milk Storage Initiated by:: RN Date initiated::  02/14/19   Consult Status Consult Status: Follow-up Date: 02/14/19 Follow-up type: In-patient    Theodoro Kalata 02/14/2019, 6:47 AM

## 2019-02-14 NOTE — Addendum Note (Signed)
Addendum  created 02/14/19 0841 by Graciela Husbands, CRNA   Charge Capture section accepted, Clinical Note Signed

## 2019-02-14 NOTE — Anesthesia Postprocedure Evaluation (Signed)
Anesthesia Post Note  Patient: Kathryn Pena  Procedure(s) Performed: AN AD HOC LABOR EPIDURAL     Patient location during evaluation: Mother Baby Anesthesia Type: Epidural Level of consciousness: awake and alert and oriented Pain management: satisfactory to patient Vital Signs Assessment: post-procedure vital signs reviewed and stable Respiratory status: respiratory function stable Cardiovascular status: stable Postop Assessment: no headache, no backache, epidural receding, patient able to bend at knees, no signs of nausea or vomiting and adequate PO intake Anesthetic complications: no    Last Vitals:  Vitals:   02/14/19 0528 02/14/19 0750  BP: (!) 143/89 (!) 147/90  Pulse: 80 91  Resp: 16 16  Temp: (!) 36.3 C 36.5 C  SpO2: 98% 100%    Last Pain:  Vitals:   02/14/19 0800  TempSrc:   PainSc: 0-No pain   Pain Goal: Patients Stated Pain Goal: 2 (02/14/19 0800)              Epidural/Spinal Function Cutaneous sensation: Normal sensation (02/14/19 0800), Patient able to flex knees: Yes (02/14/19 0800), Patient able to lift hips off bed: Yes (02/14/19 0800), Back pain beyond tenderness at insertion site: No (02/14/19 0800), Progressively worsening motor and/or sensory loss: No (02/14/19 0800), Bowel and/or bladder incontinence post epidural: No (02/14/19 0800)  Steffanie Mingle

## 2019-02-14 NOTE — Progress Notes (Signed)
Patient pushing with contractions, doing well pushing and moving baby with each contraction  FHR: 130/ moderate/ +accels/ variable deceleration  Toco: 2-3  Continue pushing with contractions  Plans SVD  Cat II   Sharyon Cable, CNM 02/14/19, 12:37 AM

## 2019-02-14 NOTE — Lactation Note (Signed)
This note was copied from a baby's chart. Lactation Consultation Note  Patient Name: Kathryn Pena Date: 02/14/2019 Reason for consult: Follow-up assessment;Primapara;1st time breastfeeding;Early term 13-38.6wks  16 hours old early term female who is now being partially BF and formula fed by his mother, she's a P1. Baby has been having difficulty latching on to the breast, per mom she's been trying but every time she does he gets "mad" and start screaming and won't latch on. LC offered assistance with latch and baby did the exact same thing that mom described earlier. Still some edema noted on nipples, when doing hand expression with mom, only a small droplet was observed on each breast. Noted that mom's tissue is not compressible, her nipples look semi-flat but invert upon compression.   LC tried a NS # 20 afterwards. This time baby was able to latch right away in the same cross cradle position tried on earlier, and he was able to sustain the latch for at least 7 minutes until LC exited the room. No audible swallows heard though but baby was sucking in a constant rhythmical pattern. Mom very pleased, and she stated that feedings at the breast were comfortable.  Mom has only pumped twice today, explained to mom the importance of consistent pumping to protect her milk supply. She hasn't been wearing her shells either, she'll start doing that tomorrow during daytime. Discussed normal newborn behavior, feeding cues, supplementation guidelines according to baby's age in hours and milk supply. Baby is already getting supplemented with Rush Barer Gentle, he had a cup feeding of 10 ml before LC consultation.  Feeding plan  1. Encouraged mom to feed baby 8-12 times/24 hours or sooner if feeding cues are present 2. Mom will pump every 3 hours, at least 4-6 pumping sessions in a 24 hours/period (more realistic goal as mom is not pumping consistently) 3. In the mean time baby will continue getting  supplemented with Gerber Gentle using a feeding or medicine cup. Mom aware of the amounts needed for supplementation and that they'll go up to 7-12 ml once he turns 24 hours.   Mom reported all questions and concerns were answered, she's aware of LC OP services and will call PRN.  Maternal Data    Feeding Feeding Type: Breast Fed  LATCH Score Latch: Grasps breast easily, tongue down, lips flanged, rhythmical sucking.(with NS # 20)  Audible Swallowing: None  Type of Nipple: Everted at rest and after stimulation(invert upon compression)  Comfort (Breast/Nipple): Soft / non-tender  Hold (Positioning): Assistance needed to correctly position infant at breast and maintain latch.  LATCH Score: 7  Interventions Interventions: Breast feeding basics reviewed;Assisted with latch;Skin to skin;Breast massage;Hand express;Breast compression;Adjust position;Support pillows  Lactation Tools Discussed/Used Tools: Nipple Shields Nipple shield size: 20   Consult Status Consult Status: Follow-up Date: 02/15/19 Follow-up type: In-patient    Kathryn Pena Kathryn Pena 02/14/2019, 6:00 PM

## 2019-02-14 NOTE — Progress Notes (Signed)
CRITICAL VALUE ALERT  Critical Value:  Magnesium 6.7   Date & Time Notied:  02/14/2019 @ 1844  Provider Notified: Dr. Marice Potter  Orders Received/Actions taken: D/C Mag at 24 hrs PP

## 2019-02-14 NOTE — Discharge Summary (Signed)
Postpartum Discharge Summary     Patient Name: Kathryn Pena DOB: 2000-12-10 MRN: [id number]  Date of admission: 02/12/2019 Delivering Provider: Sharyon CableOGERS, VERONICA C   Date of discharge: 02/16/2019  Admitting diagnosis: pregnancy Intrauterine pregnancy: 7215w2d     Secondary diagnosis:  Active Problems:   Preeclampsia, severe   Acute renal failure with rising serum Cr and anuria during labor due to pre-eclampsia  Additional problems: none     Discharge diagnosis: Term Pregnancy Delivered, Preeclampsia (severe) and IUD in place                                                                                                Post partum procedures:IUD insertion  Augmentation: AROM, Pitocin, Cytotec and Foley Balloon  Complications: None  Hospital course:  Induction of Labor With Vaginal Delivery   18 y.o. yo G1P0 at 8315w2d was admitted to the hospital 02/12/2019 for induction of labor.  Indication for induction: Gestational hypertension.  Patient had an uncomplicated labor course as follows: Membrane Rupture Time/Date: 4:00 PM ,02/13/2019   Intrapartum Procedures: Episiotomy: None [1]                                         Lacerations:  2nd degree [3];Perineal [11]  Patient had delivery of a Viable infant.  Information for the patient's newborn:  Tora DuckMcIntyre, Boy Cotina [[id number]]  Delivery Method: Vag-Spont   02/14/2019  Details of delivery can be found in separate delivery note.  Patient had a routine postpartum course. Patient is discharged home 02/16/19.  Magnesium Sulfate received: Yes BMZ received: No  Physical exam  Vitals:   02/15/19 2021 02/16/19 0002 02/16/19 0448 02/16/19 0800  BP: (!) 147/98 (!) 148/85 119/80 122/80  Pulse: 91 79 76 80  Resp: 18 18 18 18   Temp: 98.9 F (37.2 C) 98.8 F (37.1 C) 98.2 F (36.8 C) 98.2 F (36.8 C)  TempSrc: Oral Oral Oral Oral  SpO2: 98% 99% 98% 98%  Weight:      Height:      Blood pressure 122/80, pulse 80, temperature 98.2 F  (36.8 C), temperature source Oral, resp. rate 18, height 5\' 7"  (1.702 m), weight 102.1 kg, last menstrual period 06/05/2018, SpO2 98 %, unknown if currently breastfeeding.  General: alert, cooperative and no distress Lochia: appropriate Uterine Fundus: firm Incision: N/A DVT Evaluation: No evidence of DVT seen on physical exam. Labs: Lab Results  Component Value Date   WBC 17.2 (H) 02/14/2019   HGB 11.9 (L) 02/14/2019   HCT 36.1 02/14/2019   MCV 87.6 02/14/2019   PLT 201 02/14/2019   CMP Latest Ref Rng & Units 02/14/2019  Glucose 70 - 99 mg/dL 782(N109(H)  BUN 6 - 20 mg/dL 8  Creatinine 5.620.44 - 1.301.00 mg/dL 8.65(H1.72(H)  Sodium 846135 - 962145 mmol/L 136  Potassium 3.5 - 5.1 mmol/L 3.1(L)  Chloride 98 - 111 mmol/L 104  CO2 22 - 32 mmol/L 23  Calcium 8.9 - 10.3 mg/dL 8.2(L)  Total Protein 6.5 -  8.1 g/dL 3.7(J)  Total Bilirubin 0.3 - 1.2 mg/dL 0.4  Alkaline Phos 38 - 126 U/L 106  AST 15 - 41 U/L 28  ALT 0 - 44 U/L 14    Discharge instruction: per After Visit Summary and "Baby and Me Booklet".  After visit meds:  Allergies as of 02/16/2019   No Known Allergies     Medication List    STOP taking these medications   acetaminophen 500 MG tablet Commonly known as:  TYLENOL   Doxylamine-Pyridoxine 10-10 MG Tbec   ondansetron 4 MG tablet Commonly known as:  ZOFRAN     TAKE these medications   ibuprofen 600 MG tablet Commonly known as:  ADVIL Take 1 tablet (600 mg total) by mouth every 6 (six) hours.   Prenate Mini 29-0.6-0.4-350 MG Caps Take 1 capsule by mouth daily.       Diet: routine diet  Activity: Advance as tolerated. Pelvic rest for 6 weeks.   Outpatient follow up:4 weeks Follow up Appt: Future Appointments  Date Time Provider Department Center  02/24/2019  1:15 PM [facility] NURSE [facility] None  03/16/2019  1:00 PM Sharyon Cable, CNM [facility] None   Follow up Visit: Follow-up Information    [facility] Follow up in 4 week(s).   Specialty:   Obstetrics and Gynecology Contact information: [address], [address] [place] [address] [phone]           Please schedule this patient for Postpartum visit in: 4 weeks with the following provider: Any provider For C/S patients schedule nurse incision check in weeks 2 weeks: no High risk pregnancy complicated by: HTN Delivery mode:  SVD Anticipated Birth Control: Postplacental  IUD placed PP Procedures needed: BP check  Schedule Integrated BH visit: no   Newborn Data: Live born female  Birth Weight:   APGAR: 6, 8  Newborn Delivery   Birth date/time:  02/14/2019 01:10:00 Delivery type:  Vaginal, Spontaneous     Baby Feeding: Breast Disposition:home with mother   02/16/2019 Scheryl Darter, MD

## 2019-02-14 NOTE — Plan of Care (Signed)
Postpartum Care day 1

## 2019-02-14 NOTE — Procedures (Addendum)
Post-Placental IUD Insertion Procedure Note  Patient identified, informed consent signed prior to delivery, signed copy in chart, time out was performed.    Vaginal, labial and perineal areas thoroughly inspected for lacerations. 2nd  degree and left labial laceration identified - not hemostatic,repaired prior to insertion of Liletta IUD  - IUD grasped with sterile ring forceps. Fundus identified through abdominal wall using non-insertion hand. IUD inserted to fundus with ring forceps. IUD carefully released at the fundus and ring forceps gently removed from vagina.    Strings trimmed to the level of the introitus. Patient tolerated procedure well.  Lot # [id number] Expiration Date10/2023  Patient given post procedure instructions and IUD care card with expiration date.  Patient is asked to keep IUD strings tucked in her vagina until her postpartum follow up visit in 4-6 weeks. Patient advised to abstain from sexual intercourse and pulling on strings before her follow-up visit. Patient verbalized an understanding of the plan of care and agrees.   Sharyon Cable, CNM 02/14/19, 2:19 AM

## 2019-02-15 NOTE — Progress Notes (Signed)
Post Partum Day 1 s/p NSVD s/p IOL for pre eclampsia Subjective: no complaints, up ad lib, voiding and tolerating PO  Objective: Blood pressure 136/83, pulse 80, temperature 98 F (36.7 C), temperature source Oral, resp. rate 18, height 5\' 7"  (1.702 m), weight 102.1 kg, last menstrual period 06/05/2018, SpO2 100 %, unknown if currently breastfeeding.  Physical Exam:  General: alert Lochia: appropriate Uterine Fundus: firm DVT Evaluation: No evidence of DVT seen on physical exam.  Recent Labs    02/13/19 2207 02/14/19 0320  HGB 11.6* 11.9*  HCT 35.5* 36.1    Assessment/Plan: Plan for discharge tomorrow  Creatinine now trending downward   LOS: 3 days   Allie Bossier 02/15/2019, 6:49 AM

## 2019-02-15 NOTE — Lactation Note (Signed)
This note was copied from a baby's chart. Lactation Consultation Note  Patient Name: Kathryn Pena AVWPV'X Date: 02/15/2019 Reason for consult: Follow-up assessment   Baby 38 hours old,[redacted]w[redacted]d and at end of breastfeeding session upon entering using #20NS. Mother has DEBP in her room but states she has not been pumping. Provided education regarding pumping with using NS.   Encouraged her to pump after every other feeding and give volume back to baby. Mother states she has personal DEBP at home. Feed on demand approximately 8-12 times per day.   Provided mother with another NS. Assisted w/ latching in other positions but mother reverts back to cradle hold.    Maternal Data    Feeding Feeding Type: Breast Fed  LATCH Score Latch: Repeated attempts needed to sustain latch, nipple held in mouth throughout feeding, stimulation needed to elicit sucking reflex.  Audible Swallowing: A few with stimulation  Type of Nipple: Everted at rest and after stimulation  Comfort (Breast/Nipple): Soft / non-tender  Hold (Positioning): Assistance needed to correctly position infant at breast and maintain latch.  LATCH Score: 7  Interventions Interventions: Breast feeding basics reviewed;Assisted with latch;DEBP  Lactation Tools Discussed/Used Tools: Pump Nipple shield size: 20 Breast pump type: Double-Electric Breast Pump   Consult Status Consult Status: Follow-up Date: 02/16/19 Follow-up type: In-patient    Dahlia Byes [facility] 02/15/2019, 3:20 PM

## 2019-02-16 MED ORDER — IBUPROFEN 600 MG PO TABS: 600.0000 mg | ORAL_TABLET | Freq: Four times a day (QID) | ORAL | 0 refills | Status: DC

## 2019-02-16 NOTE — Discharge Instructions (Signed)
Hypertension During Pregnancy  Hypertension is also called high blood pressure. High blood pressure means that the force of your blood moving in your body is too strong. When you are pregnant, this condition should be watched carefully. It can cause problems for you and your baby. Follow these instructions at home: Eating and drinking   Drink enough fluid to keep your pee (urine) pale yellow.  Avoid caffeine. Lifestyle  Do not use any products that contain nicotine or tobacco, such as cigarettes and e-cigarettes. If you need help quitting, ask your doctor.  Do not use alcohol or drugs.  Avoid stress.  Rest and get plenty of sleep. General instructions  Take over-the-counter and prescription medicines only as told by your doctor.  While lying down, lie on your left side. This keeps pressure off your major blood vessels.  While sitting or lying down, raise (elevate) your feet. Try putting some pillows under your lower legs.  Exercise regularly. Ask your doctor what kinds of exercise are best for you.  Keep all prenatal and follow-up visits as told by your doctor. This is important. Contact a doctor if:  You have symptoms that your doctor told you to watch for, such as: ? Throwing up (vomiting). ? Feeling sick to your stomach (nausea). ? Headache. Get help right away if you have:  Very bad belly pain that does not get better with treatment.  A very bad headache that does not get better.  Throwing up that does not get better with treatment.  Sudden, fast weight gain.  Sudden swelling in your hands, ankles, or face.  Bleeding from your vagina.  Blood in your pee.  Fewer movements from your baby than usual.  Blurry vision.  Double vision.  Muscle twitching.  Sudden muscle tightening (spasms).  Trouble breathing.  Blue fingernails or lips. Summary  Hypertension is also called high blood pressure. High blood pressure means that the force of your blood moving  in your body is too strong.  When you are pregnant, this condition should be watched carefully. It can cause problems for you and your baby.  Get help right away if you have symptoms that your doctor told you to watch for. This information is not intended to replace advice given to you by your health care provider. Make sure you discuss any questions you have with your health care provider. Document Released: 11/01/2010 Document Revised: 09/15/2017 Document Reviewed: 06/10/2016 Elsevier Interactive Patient Education  2019 Elsevier Inc.  Vaginal Delivery, Care After Refer to this sheet in the next few weeks. These instructions provide you with information about caring for yourself after vaginal delivery. Your health care provider may also give you more specific instructions. Your treatment has been planned according to current medical practices, but problems sometimes occur. Call your health care provider if you have any problems or questions. What can I expect after the procedure? After vaginal delivery, it is common to have:  Some bleeding from your vagina.  Soreness in your abdomen, your vagina, and the area of skin between your vaginal opening and your anus (perineum).  Pelvic cramps.  Fatigue. Follow these instructions at home: Medicines  Take over-the-counter and prescription medicines only as told by your health care provider.  If you were prescribed an antibiotic medicine, take it as told by your health care provider. Do not stop taking the antibiotic until it is finished. Driving   Do not drive or operate heavy machinery while taking prescription pain medicine.  Do not drive  for 24 hours if you received a sedative. Lifestyle  Do not drink alcohol. This is especially important if you are breastfeeding or taking medicine to relieve pain.  Do not use tobacco products, including cigarettes, chewing tobacco, or e-cigarettes. If you need help quitting, ask your health care  provider. Eating and drinking  Drink at least 8 eight-ounce glasses of water every day unless you are told not to by your health care provider. If you choose to breastfeed your baby, you may need to drink more water than this.  Eat high-fiber foods every day. These foods may help prevent or relieve constipation. High-fiber foods include: ? Whole grain cereals and breads. ? Brown rice. ? Beans. ? Fresh fruits and vegetables. Activity  Return to your normal activities as told by your health care provider. Ask your health care provider what activities are safe for you.  Rest as much as possible. Try to rest or take a nap when your baby is sleeping.  Do not lift anything that is heavier than your baby or 10 lb (4.5 kg) until your health care provider says that it is safe.  Talk with your health care provider about when you can engage in sexual activity. This may depend on your: ? Risk of infection. ? Rate of healing. ? Comfort and desire to engage in sexual activity. Vaginal Care  If you have an episiotomy or a vaginal tear, check the area every day for signs of infection. Check for: ? More redness, swelling, or pain. ? More fluid or blood. ? Warmth. ? Pus or a bad smell.  Do not use tampons or douches until your health care provider says this is safe.  Watch for any blood clots that may pass from your vagina. These may look like clumps of dark red, brown, or black discharge. General instructions  Keep your perineum clean and dry as told by your health care provider.  Wear loose, comfortable clothing.  Wipe from front to back when you use the toilet.  Ask your health care provider if you can shower or take a bath. If you had an episiotomy or a perineal tear during labor and delivery, your health care provider may tell you not to take baths for a certain length of time.  Wear a bra that supports your breasts and fits you well.  If possible, have someone help you with household  activities and help care for your baby for at least a few days after you leave the hospital.  Keep all follow-up visits for you and your baby as told by your health care provider. This is important. Contact a health care provider if:  You have: ? Vaginal discharge that has a bad smell. ? Difficulty urinating. ? Pain when urinating. ? A sudden increase or decrease in the frequency of your bowel movements. ? More redness, swelling, or pain around your episiotomy or vaginal tear. ? More fluid or blood coming from your episiotomy or vaginal tear. ? Pus or a bad smell coming from your episiotomy or vaginal tear. ? A fever. ? A rash. ? Little or no interest in activities you used to enjoy. ? Questions about caring for yourself or your baby.  Your episiotomy or vaginal tear feels warm to the touch.  Your episiotomy or vaginal tear is separating or does not appear to be healing.  Your breasts are painful, hard, or turn red.  You feel unusually sad or worried.  You feel nauseous or you vomit.  You  large blood clots from your vagina. If you pass a blood clot from your vagina, save it to show to your health care provider. Do not flush blood clots down the toilet without having your health care provider look at them. °· You urinate more than usual. °· You are dizzy or light-headed. °· You have not breastfed at all and you have not had a menstrual period for 12 weeks after delivery. °· You have stopped breastfeeding and you have not had a menstrual period for 12 weeks after you stopped breastfeeding. °Get help right away if: °· You have: °? Pain that does not go away or does not get better with medicine. °? Chest pain. °? Difficulty breathing. °? Blurred vision or spots in your vision. °? Thoughts about hurting yourself or your baby. °· You develop pain in your abdomen or in one of your legs. °· You develop a severe headache. °· You faint. °· You bleed from your vagina so much that you fill two  sanitary pads in one hour. °This information is not intended to replace advice given to you by your health care provider. Make sure you discuss any questions you have with your health care provider. °Document Released: 09/26/2000 Document Revised: 03/12/2016 Document Reviewed: 10/14/2015 °Elsevier Interactive Patient Education © 2019 Elsevier Inc. ° °

## 2019-02-16 NOTE — Lactation Note (Signed)
This note was copied from a baby's chart. Lactation Consultation Note  Patient Name: Kathryn Pena Date: 02/16/2019   P1, Baby 55 hours old.  [redacted]w[redacted]d.  9.7% weight loss.  Mother's breasts are filling.  Infants's stools transitioning to green per mother.  Baby latched upon entering with #20NS.  Rhythmical sucks and frequent swallows observed.   When baby unlatched, nipple shield was full of colostrum. Reminded mother to post pump q 3 hours and give breastmilk back to baby after each feeding. If no breastmilk, mother knows to supplement with gerber.  Goal today 15-20 ml supplement q 3 hours with feeding. Mother has personal DEBP at home.  Reviewed milk storage and suggest mother call if further help is needed.  Discussed feeding plan with Donzetta Sprung RN.       Maternal Data Has patient been taught Hand Expression?: Yes  Feeding Feeding Type: Breast Fed  LATCH Score                   Interventions    Lactation Tools Discussed/Used     Consult Status      Hardie Pulley 02/16/2019, 8:44 AM

## 2019-02-16 NOTE — Progress Notes (Signed)
Discharge instructions reviewed with patient.  RN reviewed "Baby and Me" booklet.  She understands the symptoms to return to the hospital.  Infant was placed in car seat and she was escorted to the car by RN

## 2019-02-24 ENCOUNTER — Ambulatory Visit: Payer: Medicaid Other

## 2019-02-24 ENCOUNTER — Other Ambulatory Visit: Payer: Self-pay

## 2019-02-24 VITALS — BP 139/93 | HR 90

## 2019-02-24 DIAGNOSIS — Z0130 Encounter for examination of blood pressure without abnormal findings: Secondary | ICD-10-CM

## 2019-02-24 MED ORDER — ENALAPRIL MALEATE 5 MG PO TABS: 5.0000 mg | ORAL_TABLET | Freq: Every day | ORAL | 0 refills | Status: DC

## 2019-02-24 NOTE — Progress Notes (Signed)
..  Subjective:  Kathryn Pena is a 18 y.o. female here for BP check.   Hypertension ROS: Pt denies any symptoms of increased BP, no swelling noted.  Objective:  BP (!) 139/93   Pulse 90   Appearance alert, well appearing, and in no distress. General exam BP noted to be well controlled today in office.    Assessment:   Blood Pressure asymptomatic.   Plan:  Provider ordered for pt to start BP rx and to return in 1 week for follow up BP appt.

## 2019-02-25 NOTE — Progress Notes (Signed)
Agree with A & P. 

## 2019-03-03 ENCOUNTER — Other Ambulatory Visit: Payer: Self-pay

## 2019-03-03 ENCOUNTER — Ambulatory Visit (INDEPENDENT_AMBULATORY_CARE_PROVIDER_SITE_OTHER): Payer: Medicaid Other

## 2019-03-03 VITALS — BP 113/79 | HR 84

## 2019-03-03 DIAGNOSIS — Z0130 Encounter for examination of blood pressure without abnormal findings: Secondary | ICD-10-CM

## 2019-03-03 NOTE — Progress Notes (Signed)
I have reviewed this chart and agree with the RN/CMA assessment and management.    K. Meryl Davis, M.D. Attending [facility])   

## 2019-03-03 NOTE — Progress Notes (Signed)
..  Subjective:  Kathryn Pena is a 18 y.o. female here for BP check.   Hypertension ROS: taking medications as instructed, no medication side effects noted, no TIA's, no chest pain on exertion, no dyspnea on exertion and no swelling of ankles.    Objective:  BP 113/79   Pulse 84   Breastfeeding No   Appearance alert, well appearing, and in no distress. General exam BP noted to be well controlled today in office.    Assessment:   Blood Pressure well controlled.   Plan:  Current treatment plan is effective, no change in therapy.Marland Kitchen

## 2019-03-10 ENCOUNTER — Telehealth: Payer: Self-pay | Admitting: Licensed Clinical Social Worker

## 2019-03-10 NOTE — Telephone Encounter (Signed)
Left detailed message regarding scheduled postpartum visit.

## 2019-03-16 ENCOUNTER — Ambulatory Visit (INDEPENDENT_AMBULATORY_CARE_PROVIDER_SITE_OTHER): Payer: Medicaid Other

## 2019-03-16 ENCOUNTER — Other Ambulatory Visit: Payer: Self-pay

## 2019-03-16 NOTE — Progress Notes (Signed)
    TELEHEALTH POSTPARTUM VIRTUAL VIDEO VISIT ENCOUNTER NOTE  Provider location: [facility] [place]   I connected with Kathryn Pena on 03/16/19 at  1:00 PM EDT by WebEx Encounter at home and verified that I am speaking with the correct person using two identifiers.    I discussed the limitations, risks, security and privacy concerns of performing an evaluation and management service by telephone and the availability of in person appointments. I also discussed with the patient that there may be a patient responsible charge related to this service. The patient expressed understanding and agreed to proceed.  Appointment Date: 03/16/2019  OBGYN Clinic: [facility]  Chief Complaint: Postpartum Visit  History of Present Illness: Kathryn Pena is a 18 y.o. African-American G1P1001 being evaluated for postpartum followup.    She is s/p normal spontaneous vaginal delivery on 02/14/19 at 37 weeks; she was discharged to home on postpartum day 2. Pregnancy complicated by preeclampsia. Baby is doing well.  Complains of n/a  Vaginal bleeding or discharge: No  Intercourse: No  Contraception: IUD Mode of feeding infant: Bottle PP depression s/s: No . Score 0 Any bowel or bladder issues: No  Pap smear: N/A  Review of Systems: Positive for n/a. Her 12 point review of systems is negative or as noted in the History of Present Illness.  Patient Active Problem List   Diagnosis Date Noted  . Preeclampsia, severe 02/14/2019    Medications Kathryn Pena had no medications administered during this visit. Current Outpatient Medications  Medication Sig Dispense Refill  . enalapril (VASOTEC) 5 MG tablet Take 1 tablet (5 mg total) by mouth daily. 30 tablet 0  . ibuprofen (ADVIL) 600 MG tablet Take 1 tablet (600 mg total) by mouth every 6 (six) hours. (Patient not taking: Reported on 03/03/2019) 30 tablet 0  . Prenat w/o A-FeCbn-Meth-FA-DHA (PRENATE MINI) 29-0.6-0.4-350 MG  CAPS Take 1 capsule by mouth daily. (Patient not taking: Reported on 02/24/2019) 30 capsule 11   No current facility-administered medications for this visit.     Allergies Patient has no known allergies.  Physical Exam:   General:  Alert, oriented and cooperative. Patient is in no acute distress.  Mental Status: Normal mood and affect. Normal behavior. Normal judgment and thought content.   Respiratory: Normal respiratory effort noted, no problems with respiration noted  Rest of physical exam deferred due to type of encounter  PP Depression Screening:    Assessment:Patient is a 18 y.o. G1P1001 who is 4 weeks postpartum from a normal spontaneous vaginal delivery.  She is doing well.   Plan: 1. Postpartum care and examination    -Patient doing well. Still taking BP medication but with no way to check BP at home. Denies HA, visual changes or epigastric pain. Will have patient come for BP check in 2 weeks and if normal, d/c BP medication -PP IUD in place and patient happy with IUD  RTC 2 weeks for BP check  I discussed the assessment and treatment plan with the patient. The patient was provided an opportunity to ask questions and all were answered. The patient agreed with the plan and demonstrated an understanding of the instructions.   The patient was advised to call back or seek an in-person evaluation/go to the ED for any concerning postpartum symptoms.  I provided 10 minutes of face-to-face time during this encounter.   Frutoso Chase, RN [facility], [facility] [employer] [facility]

## 2019-03-30 ENCOUNTER — Ambulatory Visit (INDEPENDENT_AMBULATORY_CARE_PROVIDER_SITE_OTHER): Payer: Medicaid Other

## 2019-03-30 ENCOUNTER — Other Ambulatory Visit: Payer: Self-pay

## 2019-03-30 VITALS — BP 122/74

## 2019-03-30 DIAGNOSIS — Z8759 Personal history of other complications of pregnancy, childbirth and the puerperium: Secondary | ICD-10-CM

## 2019-03-30 NOTE — Progress Notes (Signed)
Subjective:  Kathryn Pena is a 18 y.o. female here for BP check.   Hypertension ROS: taking medications as instructed, no medication side effects noted, no TIA's, no chest pain on exertion, no dyspnea on exertion and no swelling of ankles.    Objective:  There were no vitals taken for this visit.  Appearance alert, well appearing, and in no distress. General exam BP noted to be well controlled today in office.    Assessment:   Blood Pressure well controlled.   Plan:  Current treatment plan is effective, no change in therapy.

## 2019-06-23 ENCOUNTER — Other Ambulatory Visit: Payer: Self-pay

## 2019-06-23 NOTE — Progress Notes (Signed)
Pt sent mychart message reporting symptoms of PPD. Referral to [facility] sent, pt advised they will be contacting her to schedule.

## 2019-06-30 ENCOUNTER — Other Ambulatory Visit: Payer: Self-pay

## 2019-06-30 ENCOUNTER — Telehealth: Payer: Medicaid Other | Admitting: Clinical

## 2019-06-30 DIAGNOSIS — Z91199 Patient's noncompliance with other medical treatment and regimen due to unspecified reason: Secondary | ICD-10-CM

## 2019-06-30 DIAGNOSIS — Z5329 Procedure and treatment not carried out because of patient's decision for other reasons: Secondary | ICD-10-CM

## 2019-06-30 NOTE — BH Specialist Note (Signed)
Pt did not arrive to video visit and did not answer the phone; Left HIPPA-compliant message to call back Roselyn Reef from [facility] at [phone], and left MyChart message for patient.   [place] via Telemedicine Video Visit  06/30/2019 Kathryn Pena [phone]  Garlan Fair

## 2019-11-03 ENCOUNTER — Other Ambulatory Visit: Payer: Self-pay

## 2019-11-03 ENCOUNTER — Encounter: Payer: Self-pay | Admitting: Emergency Medicine

## 2019-11-03 ENCOUNTER — Ambulatory Visit
Admission: EM | Admit: 2019-11-03 | Discharge: 2019-11-03 | Disposition: A | Discharge status: Home / Self Care | Payer: Medicaid Other | Attending: Emergency Medicine | Admitting: Emergency Medicine

## 2019-11-03 DIAGNOSIS — L02412 Cutaneous abscess of left axilla: Secondary | ICD-10-CM | POA: Diagnosis not present

## 2019-11-03 DIAGNOSIS — L02419 Cutaneous abscess of limb, unspecified: Secondary | ICD-10-CM

## 2019-11-03 DIAGNOSIS — B9689 Other specified bacterial agents as the cause of diseases classified elsewhere: Secondary | ICD-10-CM | POA: Diagnosis not present

## 2019-11-03 MED ORDER — DOXYCYCLINE HYCLATE 100 MG PO CAPS: 100.0000 mg | ORAL_CAPSULE | Freq: Two times a day (BID) | ORAL | 0 refills | Status: AC

## 2019-11-03 NOTE — ED Triage Notes (Signed)
Pt presents to [facility] for assessment of an abscess to left arm near axilla x 3 days, which she states swelled significantly yesterday.  Denies fevers at home.  Denies drainage.

## 2019-11-03 NOTE — ED Provider Notes (Signed)
[name]    CSN: [id number] Arrival date & time: 11/03/19  9518      History   Chief Complaint Chief Complaint  Patient presents with  . Abscess    HPI Kathryn Pena is a 19 y.o. female presenting for left axillary abscess for the last 3 days.  Patient states it became significantly more swollen yesterday.  Denied fever, arthralgias, painful arm movement.  Has not tried anything for this.  States she has had boils before, though never near her axilla.  Denies history of immunocompromise status, diabetes.   Past Medical History:  Diagnosis Date  . Pregnancy induced hypertension     Patient Active Problem List   Diagnosis Date Noted  . Preeclampsia, severe 02/14/2019    Past Surgical History:  Procedure Laterality Date  . NO PAST SURGERIES      OB History    Gravida  1   Para  1   Term  1   Preterm      AB      Living  1     SAB      TAB      Ectopic      Multiple  0   Live Births  1            Home Medications    Prior to Admission medications   Medication Sig Start Date End Date Taking? Authorizing Provider  doxycycline (VIBRAMYCIN) 100 MG capsule Take 1 capsule (100 mg total) by mouth 2 (two) times daily for 3 days. 11/03/19 11/06/19  Hall-Potvin, [place], PA-C  enalapril (VASOTEC) 5 MG tablet Take 1 tablet (5 mg total) by mouth daily. 02/24/19   Hermina Staggers, MD  ibuprofen (ADVIL) 600 MG tablet Take 1 tablet (600 mg total) by mouth every 6 (six) hours. Patient not taking: Reported on 03/03/2019 02/16/19   Adam Phenix, MD  Prenat w/o A-FeCbn-Meth-FA-DHA (PRENATE MINI) 29-0.6-0.4-350 MG CAPS Take 1 capsule by mouth daily. Patient not taking: Reported on 03/16/2019 09/01/18   Hermina Staggers, MD    Family History Family History  Problem Relation Age of Onset  . Healthy Mother     Social History Social History   Tobacco Use  . Smoking status: Former Smoker    Types: Cigars    Quit date: 06/27/2018    Years  since quitting: 1.3  . Smokeless tobacco: Never Used  Substance Use Topics  . Alcohol use: No  . Drug use: No     Allergies   Patient has no known allergies.   Review of Systems As per HPI   Physical Exam Triage Vital Signs ED Triage Vitals [11/03/19 1001]  Enc Vitals Group     BP 128/81     Pulse Rate 85     Resp 16     Temp 97.9 F (36.6 C)     Temp Source Temporal     SpO2 96 %     Weight      Height      Head Circumference      Peak Flow      Pain Score      Pain Loc      Pain Edu?      Excl. in GC?    No data found.  Updated Vital Signs BP 128/81 (BP Location: Left Arm)   Pulse 85   Temp 97.9 F (36.6 C) (Temporal)   Resp 16   LMP 10/17/2019   SpO2 96%  Visual Acuity Right Eye Distance:   Left Eye Distance:   Bilateral Distance:    Right Eye Near:   Left Eye Near:    Bilateral Near:     Physical Exam Constitutional:      General: She is not in acute distress. HENT:     Head: Normocephalic and atraumatic.  Eyes:     General: No scleral icterus.    Pupils: Pupils are equal, round, and reactive to light.  Cardiovascular:     Rate and Rhythm: Normal rate.  Pulmonary:     Effort: Pulmonary effort is normal.  Musculoskeletal:        General: Normal range of motion.  Skin:    Coloration: Skin is not jaundiced or pale.     Comments: 2.5 cm round, fluctuant abscess in left distal axilla.  Tender to palpation with surrounding erythema.  No streaking, open wound, active discharge.  Neurological:     Mental Status: She is alert and oriented to person, place, and time.      UC Treatments / Results  Labs (all labs ordered are listed, but only abnormal results are displayed) Labs Reviewed - No data to display  EKG   Radiology No results found.  Procedures Incision and Drainage  Date/Time: 11/03/2019 10:09 AM Performed by: Shea Evans, PA-C Authorized by: Shea Evans, PA-C   Consent:    Consent obtained:   Verbal   Consent given by:  Patient   Risks discussed:  Bleeding, incomplete drainage, pain and damage to other organs   Alternatives discussed:  No treatment Universal protocol:    Patient identity confirmed:  Verbally with patient Location:    Type:  Abscess   Size:  2.5 cm   Location:  Upper extremity   Upper extremity location:  Arm   Arm location:  L upper arm (axilla) Pre-procedure details:    Skin preparation:  Betadine Anesthesia (see MAR for exact dosages):    Anesthesia method:  Local infiltration   Local anesthetic:  Lidocaine 2% w/o epi Procedure type:    Complexity:  Complex Procedure details:    Needle aspiration: no     Incision types:  Single straight   Incision depth:  Subcutaneous   Scalpel blade:  11   Wound management:  Probed and deloculated and irrigated with saline   Drainage:  Purulent and bloody   Drainage amount:  Copious   Wound treatment:  Wound left open   Packing materials:  None Post-procedure details:    Patient tolerance of procedure:  Tolerated well, no immediate complications   (including critical care time)  Medications Ordered in UC Medications - No data to display  Initial Impression / Assessment and Plan / UC Course  I have reviewed the triage vital signs and the nursing notes.  Pertinent labs & imaging results that were available during my care of the patient were reviewed by me and considered in my medical decision making (see chart for details).    I have reviewed the triage vital signs and the nursing notes.  All pertinent labs & imaging results that were available during my care of the patient were reviewed by me and considered in my medical decision making (see chart for details).  Patient afebrile, nontoxic.  I&D performed in the office which patient tolerated well.  Will start doxy x 3 days.  Encouraged post I&D care as outlined below.  Return precautions discussed, patient verbalized understanding and is agreeable to  plan. Final Clinical Impressions(s) /  UC Diagnoses   Final diagnoses:  Axillary abscess     Discharge Instructions     Keep area(s) clean and dry. Apply hot compress / towel for 5-10 minutes 3-5 times daily. Return for worsening pain, redness, swelling, discharge, fever.  Helpful prevention tips: Keep nails short to avoid secondary skin infections. Use new, clean razors when shaving. Avoid antiperspirants - look for deodorants without aluminum. Avoid wearing underwire bras as this can irritate the area further.     ED Prescriptions    Medication Sig Dispense Auth. Provider   doxycycline (VIBRAMYCIN) 100 MG capsule Take 1 capsule (100 mg total) by mouth 2 (two) times daily for 3 days. 6 capsule Hall-Potvin, [place], PA-C     PDMP not reviewed this encounter.   Hall-Potvin, [place], [place] 11/03/19 1029

## 2019-11-03 NOTE — ED Notes (Signed)
Patient able to ambulate independently  

## 2019-11-03 NOTE — Discharge Instructions (Signed)
Keep area(s) clean and dry. Apply hot compress / towel for 5-10 minutes 3-5 times daily. Return for worsening pain, redness, swelling, discharge, fever.  Helpful prevention tips: Keep nails short to avoid secondary skin infections. Use new, clean razors when shaving. Avoid antiperspirants - look for deodorants without aluminum. Avoid wearing underwire bras as this can irritate the area further.  

## 2019-12-05 IMAGING — US US OB < 14 WEEKS - US OB TV
1 series · 15 of 28 positions shown · non-contrast
Comparison: None.

CLINICAL DATA: Pregnant patient with abdominal cramping.

EXAM:
OBSTETRIC <14 WK US AND TRANSVAGINAL OB US
TECHNIQUE: Both transabdominal and transvaginal ultrasound examinations were
performed for complete evaluation of the gestation as well as the
maternal uterus, adnexal regions, and pelvic cul-de-sac.
Transvaginal technique was performed to assess early pregnancy.

[Series 1: us ob < 14 weeks - us ob tv · 15 of 79 slices shown]
[im 1/79]
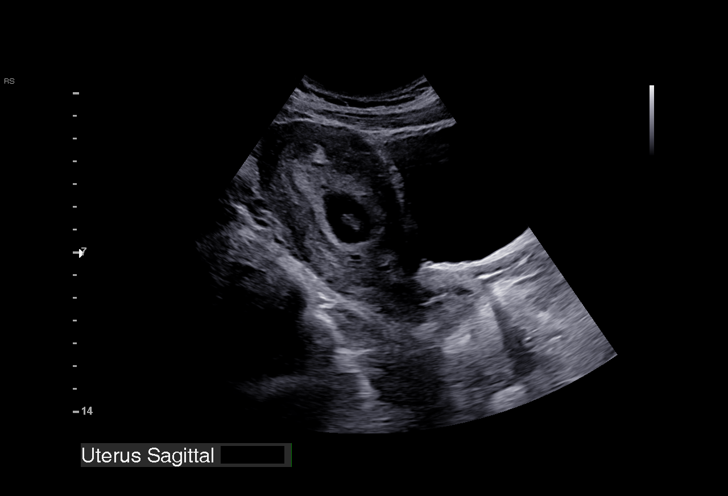
[im 6/79]
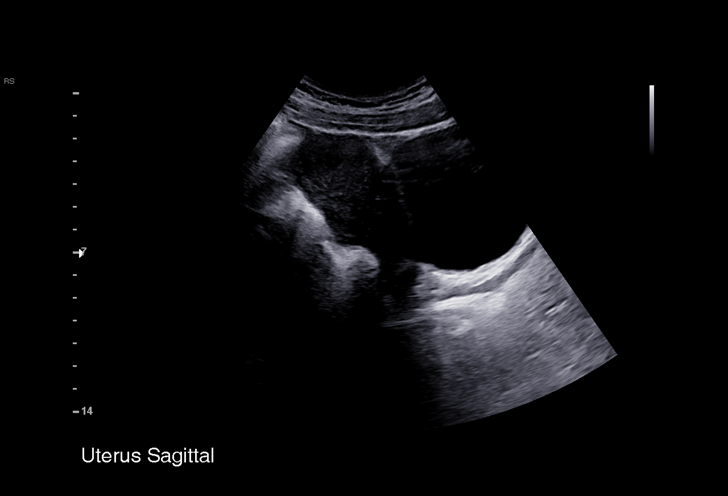
[im 12/79]
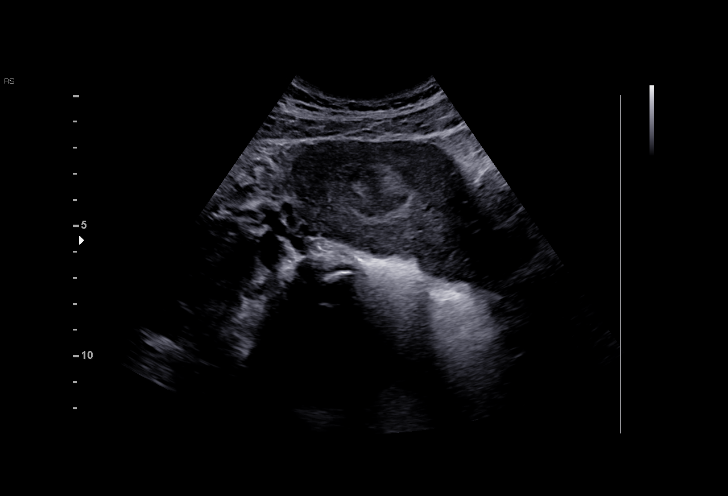
[im 18/79]
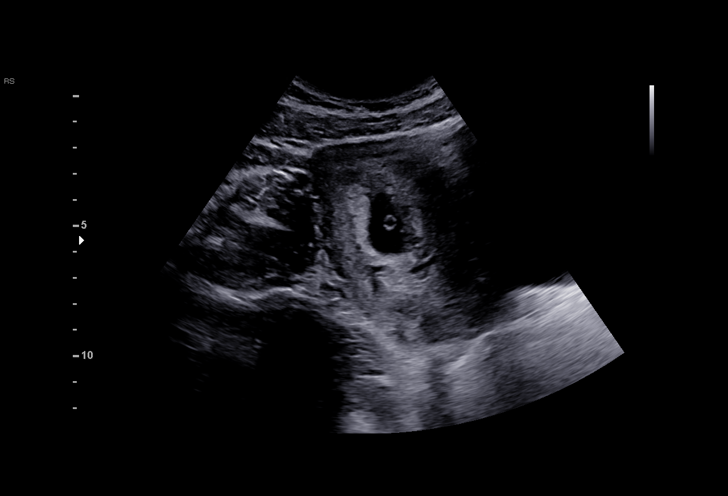
[im 24/79]
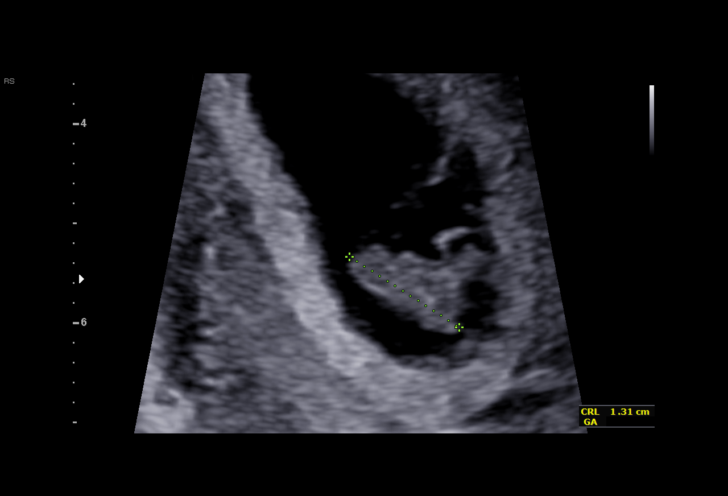
[im 29/79]
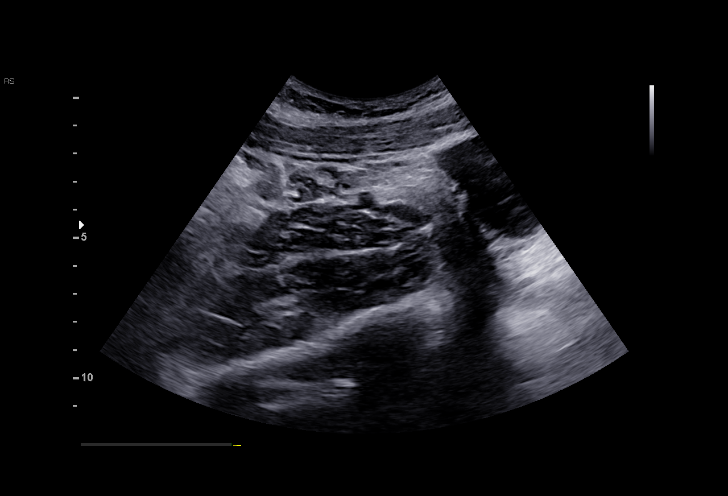
[im 35/79]
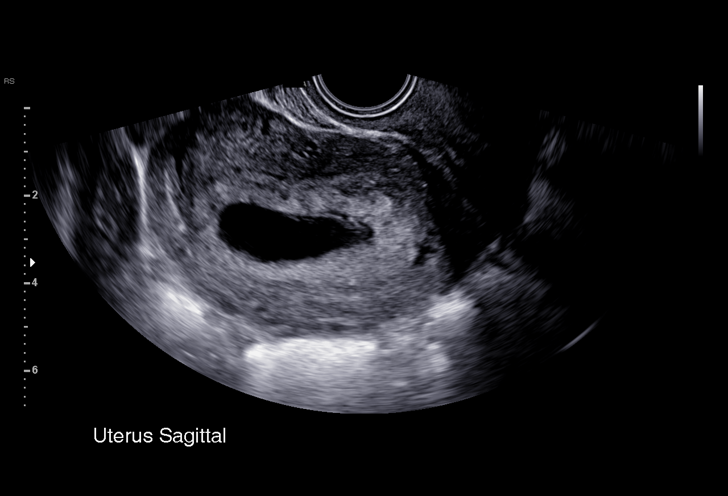
[im 41/79]
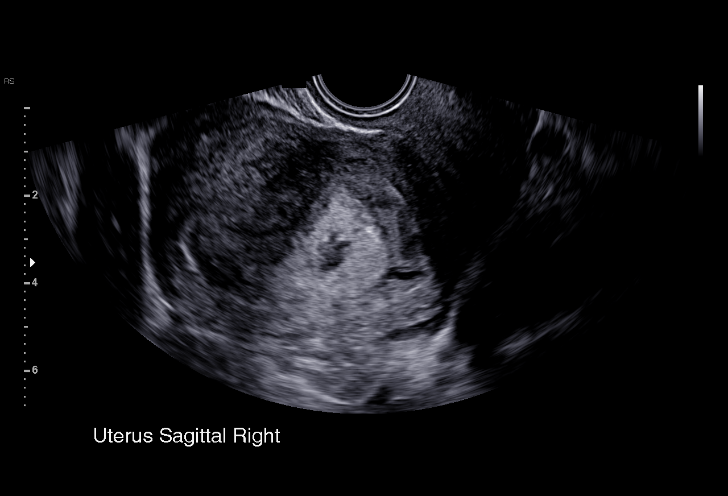
[im 44/79]
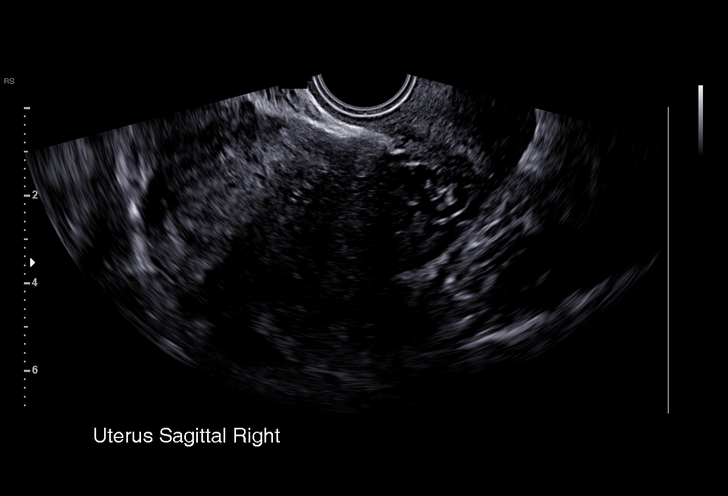
[im 50/79]
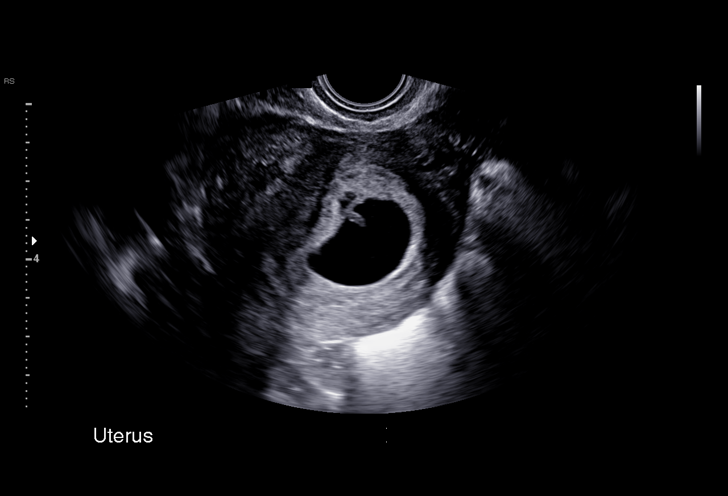
[im 55/79]
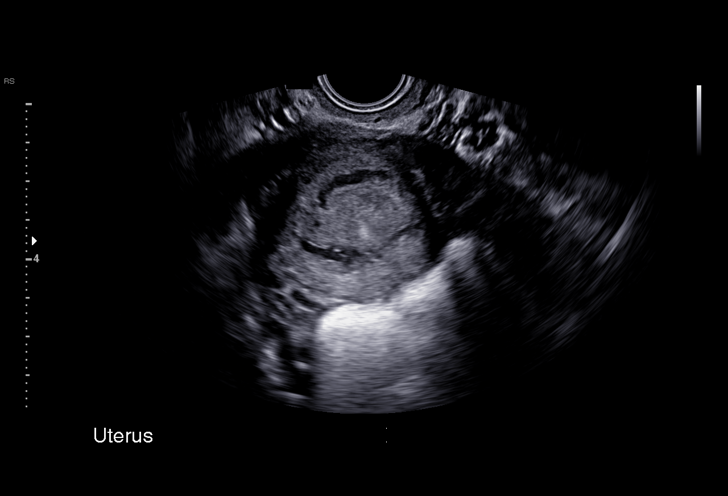
[im 61/79]
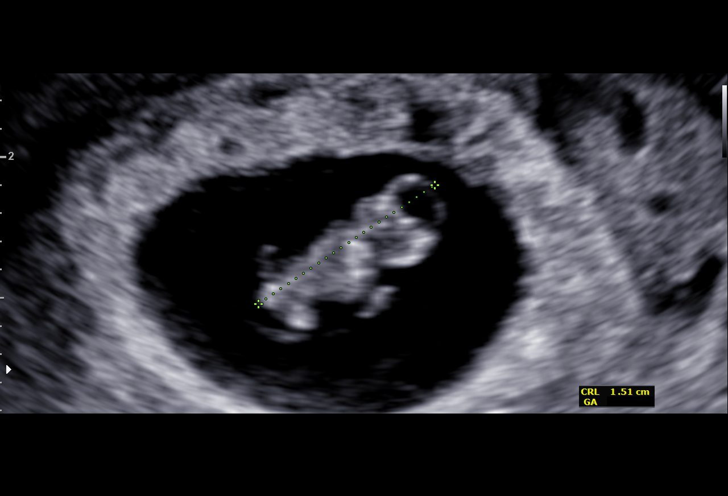
[im 67/79]
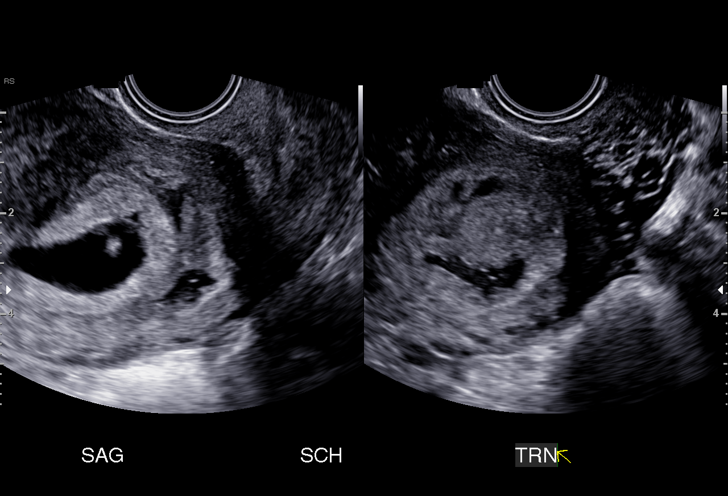
[im 73/79]
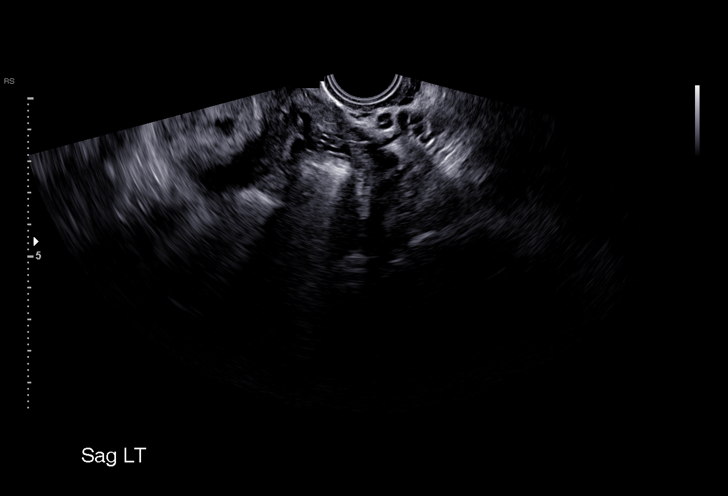
[im 79/79]
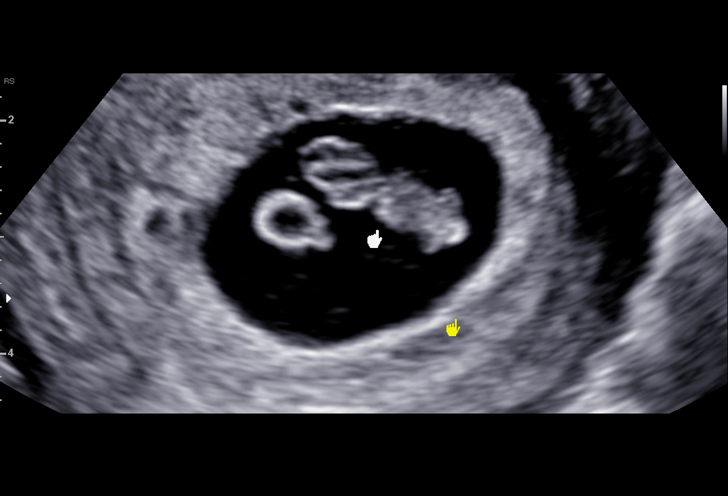

[15 of 28 positions shown; findings below may reference images not displayed]

FINDINGS: Intrauterine gestational sac: Single

Yolk sac:  Visualized.

Embryo:  Visualized.

Cardiac Activity: Visualized.

Heart Rate: 161 bpm

CRL:  15.2 mm   7 w   6 d                  US EDC: 03/05/2019

Subchorionic hemorrhage:  Small

Maternal uterus/adnexae: Normal right and left ovaries. Probable
corpus luteum left ovary. No free fluid in the pelvis.
IMPRESSION: Single live intrauterine gestation.  Small subchorionic hemorrhage.

## 2019-12-21 ENCOUNTER — Other Ambulatory Visit: Payer: Self-pay

## 2019-12-21 ENCOUNTER — Ambulatory Visit
Admission: EM | Admit: 2019-12-21 | Discharge: 2019-12-21 | Disposition: A | Discharge status: Home / Self Care | Payer: Medicaid Other | Attending: Internal Medicine | Admitting: Internal Medicine

## 2019-12-21 ENCOUNTER — Encounter: Payer: Self-pay | Admitting: Emergency Medicine

## 2019-12-21 DIAGNOSIS — R350 Frequency of micturition: Secondary | ICD-10-CM | POA: Insufficient documentation

## 2019-12-21 DIAGNOSIS — N898 Other specified noninflammatory disorders of vagina: Secondary | ICD-10-CM

## 2019-12-21 LAB — POCT URINALYSIS DIP (MANUAL ENTRY)
Bilirubin, UA: NEGATIVE
Glucose, UA: NEGATIVE mg/dL
Leukocytes, UA: NEGATIVE
Nitrite, UA: NEGATIVE
Protein Ur, POC: NEGATIVE mg/dL
Spec Grav, UA: >=1.03 — AB (ref 1.010–1.025)
Urobilinogen, UA: 0.2 E.U./dL
pH, UA: 6.5 (ref 5.0–8.0)

## 2019-12-21 LAB — POCT URINE PREGNANCY: Preg Test, Ur: NEGATIVE

## 2019-12-21 MED ORDER — FLUCONAZOLE 150 MG PO TABS: 150.0000 mg | ORAL_TABLET | Freq: Once | ORAL | 0 refills | Status: AC

## 2019-12-21 MED ORDER — METRONIDAZOLE 500 MG PO TABS: 500.0000 mg | ORAL_TABLET | Freq: Two times a day (BID) | ORAL | 0 refills | Status: AC

## 2019-12-21 MED ORDER — NAPROXEN 500 MG PO TABS: 500.0000 mg | ORAL_TABLET | Freq: Two times a day (BID) | ORAL | 0 refills | Status: DC

## 2019-12-21 NOTE — Discharge Instructions (Addendum)
Your urine did not show signs of infection; vaginal swab pending to evaluate for STDs as well as yeast and BV Begin metronidazole/Flagyl twice daily for the next week, no alcohol while taking, take with food-this will treat bacterial vaginosis Please take 1 tab of Diflucan today for yeast, may repeat after completion of antibiotic Naprosyn twice daily for pain We will call with your results and provide further medicines as needed

## 2019-12-21 NOTE — ED Triage Notes (Signed)
Pt presents to [facility] for assessment of urinary frequency and urgency with some blood in her urine since Sunday.  C/o some mid lower abdominal pain.   Does endorse some thick white discharge.  Pt is sexually active.

## 2019-12-21 NOTE — ED Provider Notes (Signed)
[name]    CSN: [id number] Arrival date & time: 12/21/19  1542      History   Chief Complaint Chief Complaint  Patient presents with  . Dysuria    HPI Kathryn Pena is a 19 y.o. female no significant past medical history presenting today for evaluation of urinary frequency and urgency.  Patient's notes that beginning Sunday she began to develop urinary frequency with incomplete voiding and urgency.  She has noticed some blood with urination.  She is unsure if this is vaginal or urethral bleeding.  She has had some left-sided lower abdominal pain.  Also has reported white thick discharge.  Denies itching or irritation.  Denies history of BV, has had prior yeast infection.  Of one prior UTI.  Denies fever, nausea or vomiting.  Is sexually active.  Last menstrual cycle was 2/20, IUD in place for birth control.  HPI  Past Medical History:  Diagnosis Date  . Pregnancy induced hypertension     Patient Active Problem List   Diagnosis Date Noted  . Preeclampsia, severe 02/14/2019    Past Surgical History:  Procedure Laterality Date  . NO PAST SURGERIES      OB History    Gravida  1   Para  1   Term  1   Preterm      AB      Living  1     SAB      TAB      Ectopic      Multiple  0   Live Births  1            Home Medications    Prior to Admission medications   Medication Sig Start Date End Date Taking? Authorizing Provider  fluconazole (DIFLUCAN) 150 MG tablet Take 1 tablet (150 mg total) by mouth once for 1 dose. 12/21/19 12/21/19  Rayni Nemitz C, PA-C  metroNIDAZOLE (FLAGYL) 500 MG tablet Take 1 tablet (500 mg total) by mouth 2 (two) times daily for 7 days. 12/21/19 12/28/19  Ezrah Panning C, PA-C  naproxen (NAPROSYN) 500 MG tablet Take 1 tablet (500 mg total) by mouth 2 (two) times daily. 12/21/19   Verlena Marlette C, PA-C  enalapril (VASOTEC) 5 MG tablet Take 1 tablet (5 mg total) by mouth daily. 02/24/19 12/21/19  Hermina Staggers, MD    Family History Family History  Problem Relation Age of Onset  . Healthy Mother     Social History Social History   Tobacco Use  . Smoking status: Former Smoker    Types: Cigars    Quit date: 06/27/2018    Years since quitting: 1.4  . Smokeless tobacco: Never Used  Substance Use Topics  . Alcohol use: No  . Drug use: No     Allergies   Patient has no known allergies.   Review of Systems Review of Systems  Constitutional: Negative for fever.  Respiratory: Negative for shortness of breath.   Cardiovascular: Negative for chest pain.  Gastrointestinal: Negative for abdominal pain, diarrhea, nausea and vomiting.  Genitourinary: Positive for frequency, urgency and vaginal discharge. Negative for dysuria, flank pain, genital sores, hematuria, menstrual problem, vaginal bleeding and vaginal pain.  Musculoskeletal: Negative for back pain.  Skin: Negative for rash.  Neurological: Negative for dizziness, light-headedness and headaches.     Physical Exam Triage Vital Signs ED Triage Vitals [12/21/19 1549]  Enc Vitals Group     BP (!) 141/89     Pulse Rate  87     Resp 16     Temp 97.8 F (36.6 C)     Temp Source Temporal     SpO2 98 %     Weight      Height      Head Circumference      Peak Flow      Pain Score 10     Pain Loc      Pain Edu?      Excl. in GC?    No data found.  Updated Vital Signs BP (!) 141/89 (BP Location: Right Arm)   Pulse 87   Temp 97.8 F (36.6 C) (Temporal)   Resp 16   SpO2 98%   Visual Acuity Right Eye Distance:   Left Eye Distance:   Bilateral Distance:    Right Eye Near:   Left Eye Near:    Bilateral Near:     Physical Exam Vitals and nursing note reviewed.  Constitutional:      Appearance: She is well-developed.     Comments: No acute distress  HENT:     Head: Normocephalic and atraumatic.     Nose: Nose normal.  Eyes:     Conjunctiva/sclera: Conjunctivae normal.  Cardiovascular:     Rate and Rhythm:  Normal rate.  Pulmonary:     Effort: Pulmonary effort is normal. No respiratory distress.  Abdominal:     General: There is no distension.     Comments: Soft, nondistended, nontender to light and deep palpation throughout abdomen  Musculoskeletal:        General: Normal range of motion.     Cervical back: Neck supple.  Skin:    General: Skin is warm and dry.  Neurological:     Mental Status: She is alert and oriented to person, place, and time.      UC Treatments / Results  Labs (all labs ordered are listed, but only abnormal results are displayed) Labs Reviewed  POCT URINALYSIS DIP (MANUAL ENTRY) - Abnormal; Notable for the following components:      Result Value   Ketones, POC UA trace (5) (*)    Spec Grav, UA >=1.030 (*)    Blood, UA trace-intact (*)    All other components within normal limits  POCT URINE PREGNANCY  CERVICOVAGINAL ANCILLARY ONLY    EKG   Radiology No results found.  Procedures Procedures (including critical care time)  Medications Ordered in UC Medications - No data to display  Initial Impression / Assessment and Plan / UC Course  I have reviewed the triage vital signs and the nursing notes.  Pertinent labs & imaging results that were available during my care of the patient were reviewed by me and considered in my medical decision making (see chart for details).     Negative leuks and nitrites on UA, not suggestive of UTI.  Likely vaginitis causing urinary symptoms.  Empirically treating for BV and yeast today with Flagyl and Diflucan.  Vaginal swab pending, will call with results and provide further treatment as needed.  Naprosyn twice daily as needed for pain in the meantime.  Discussed strict return precautions. Patient verbalized understanding and is agreeable with plan.  Final Clinical Impressions(s) / UC Diagnoses   Final diagnoses:  Urinary frequency  Vaginal discharge     Discharge Instructions     Your urine did not show  signs of infection; vaginal swab pending to evaluate for STDs as well as yeast and BV Begin metronidazole/Flagyl twice daily for  the next week, no alcohol while taking, take with food-this will treat bacterial vaginosis Please take 1 tab of Diflucan today for yeast, may repeat after completion of antibiotic Naprosyn twice daily for pain We will call with your results and provide further medicines as needed    ED Prescriptions    Medication Sig Dispense Auth. Provider   metroNIDAZOLE (FLAGYL) 500 MG tablet Take 1 tablet (500 mg total) by mouth 2 (two) times daily for 7 days. 14 tablet Ariyannah Pauling C, PA-C   fluconazole (DIFLUCAN) 150 MG tablet Take 1 tablet (150 mg total) by mouth once for 1 dose. 2 tablet Tziporah Knoke C, PA-C   naproxen (NAPROSYN) 500 MG tablet Take 1 tablet (500 mg total) by mouth 2 (two) times daily. 30 tablet Leiloni Smithers, [place] C, PA-C     PDMP not reviewed this encounter.   Janith Lima, [place] 12/21/19 1636

## 2019-12-21 NOTE — ED Notes (Signed)
Patient able to ambulate independently  

## 2019-12-24 LAB — CERVICOVAGINAL ANCILLARY ONLY
Bacterial vaginitis: NEGATIVE
Candida vaginitis: POSITIVE — AB
Chlamydia: NEGATIVE
Neisseria Gonorrhea: NEGATIVE
Trichomonas: NEGATIVE

## 2020-02-23 IMAGING — US US MFM OB COMP +14 WKS
1 series · 13 of 28 positions shown · non-contrast
Comparison: none

[Series 1: us mfm ob comp +14 wks · 13 of 99 slices shown]
[im 4/99]
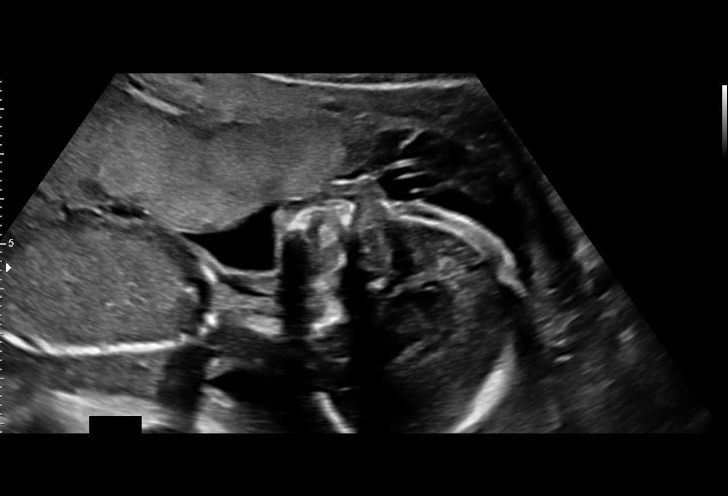
[im 11/99]
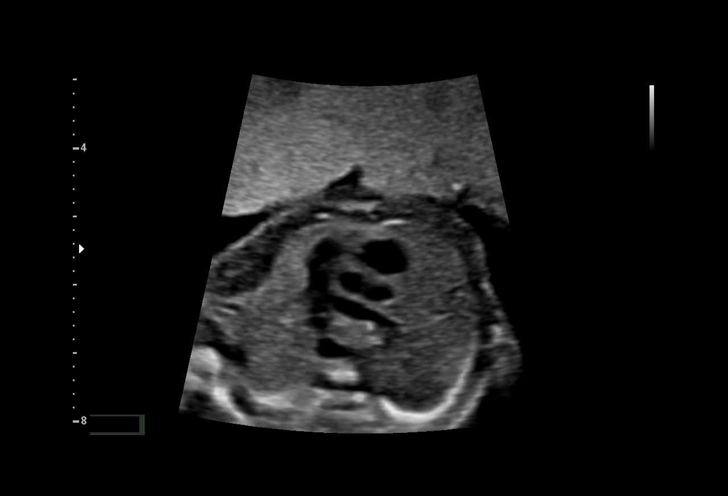
[im 19/99]
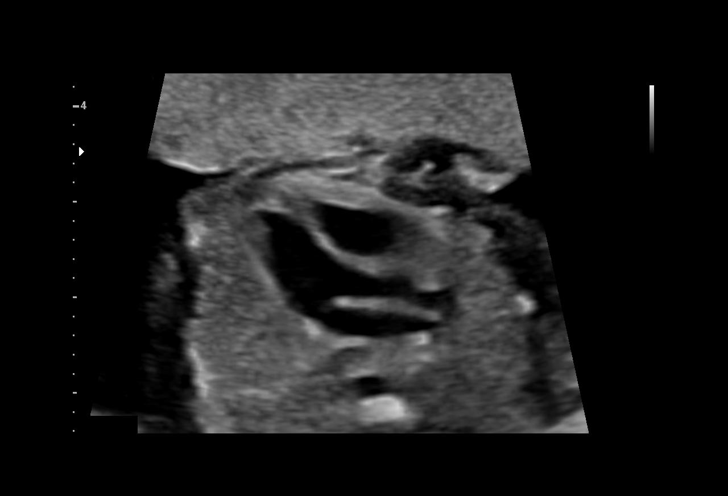
[im 26/99]
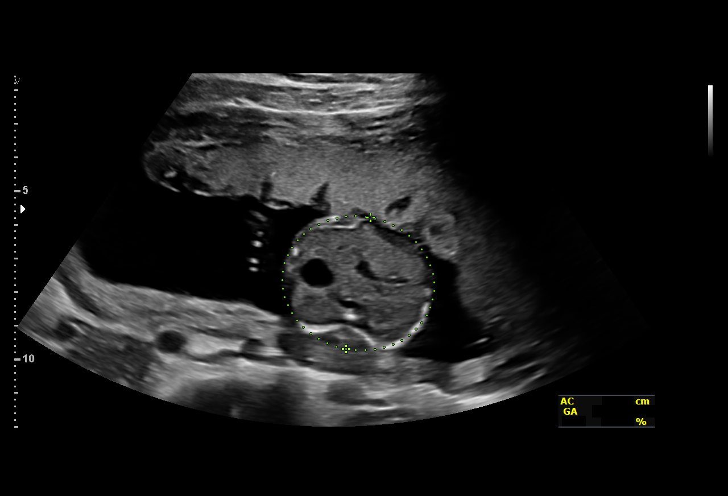
[im 33/99]
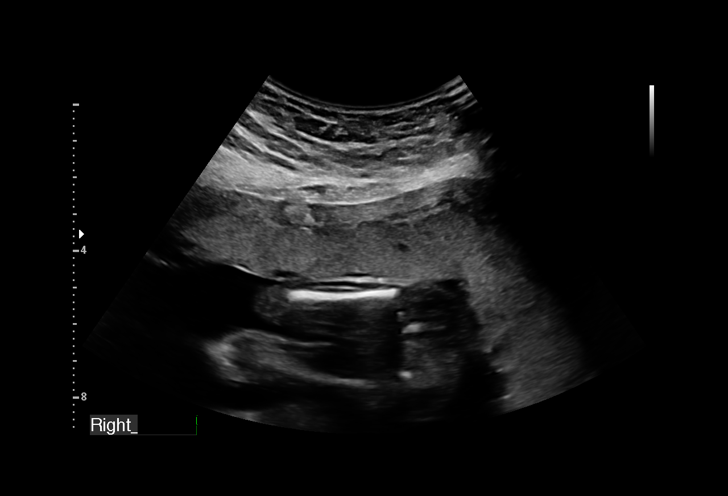
[im 40/99]
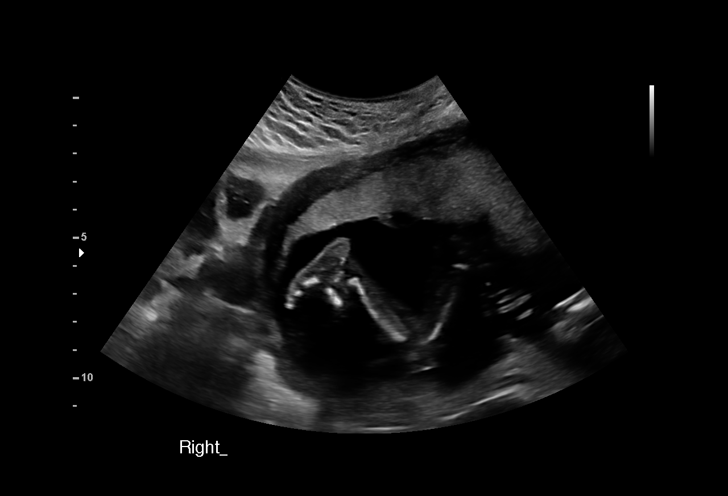
[im 51/99]
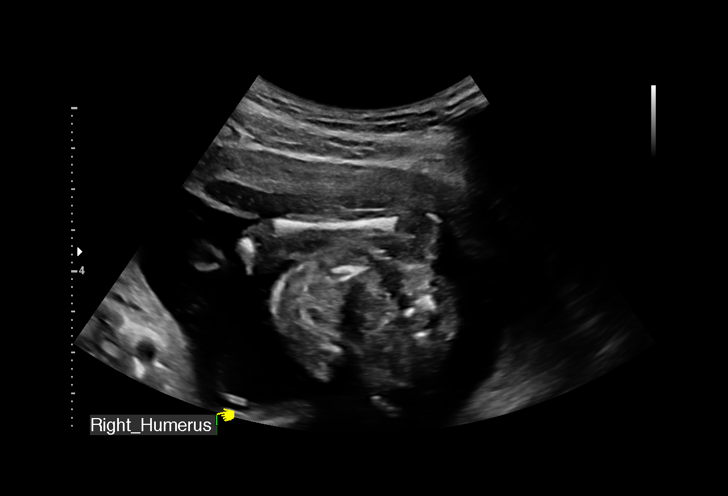
[im 59/99]
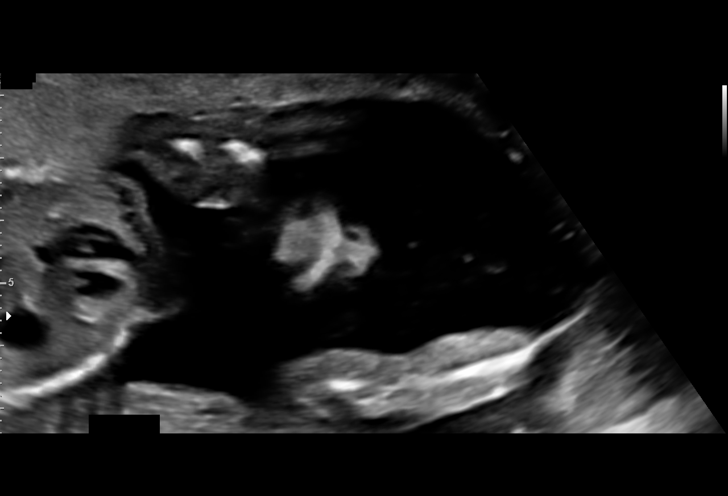
[im 66/99]
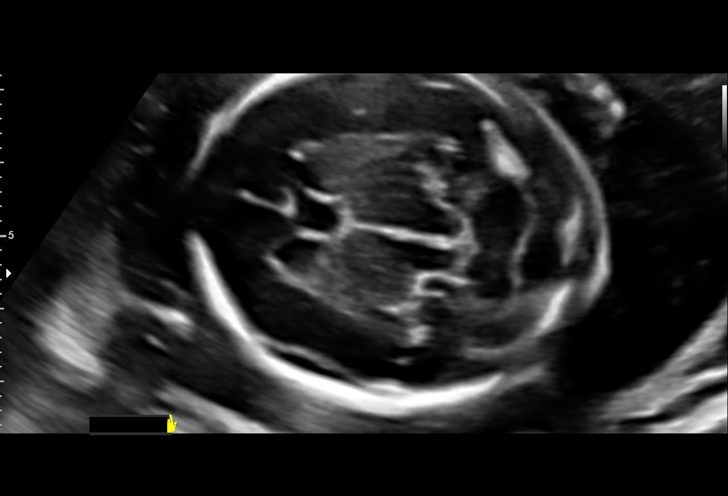
[im 73/99]
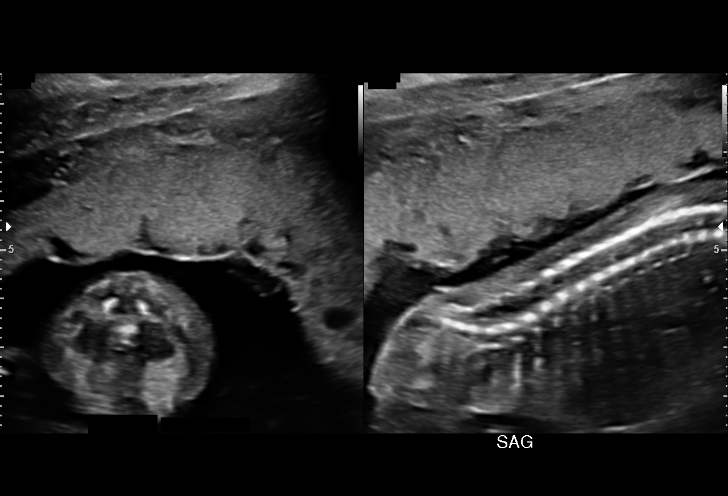
[im 80/99]
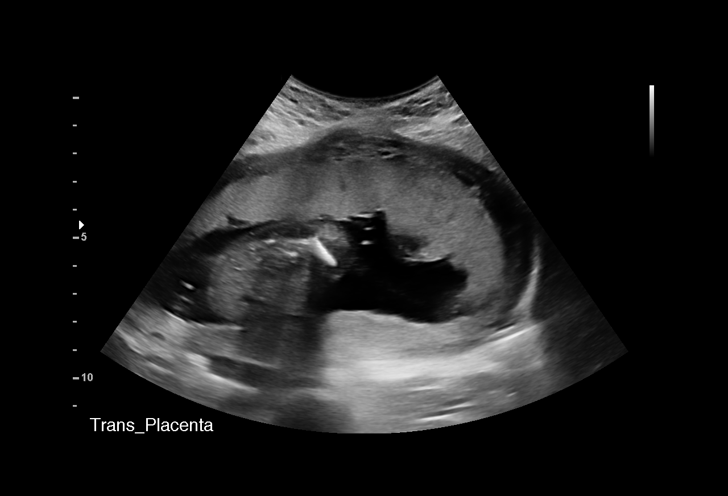
[im 88/99]
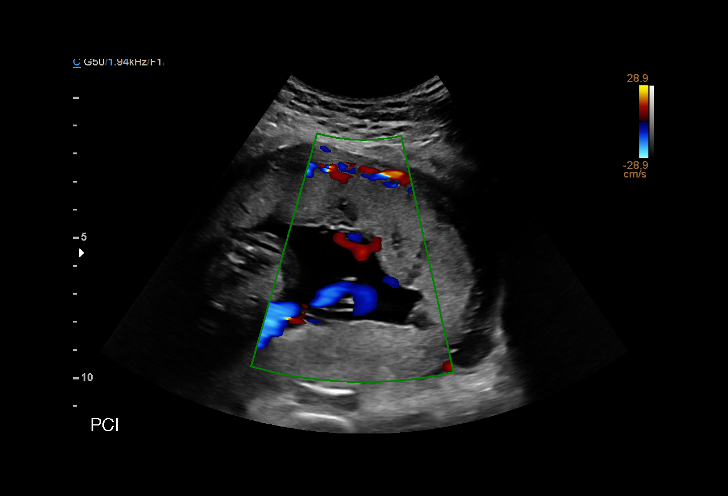
[im 95/99]
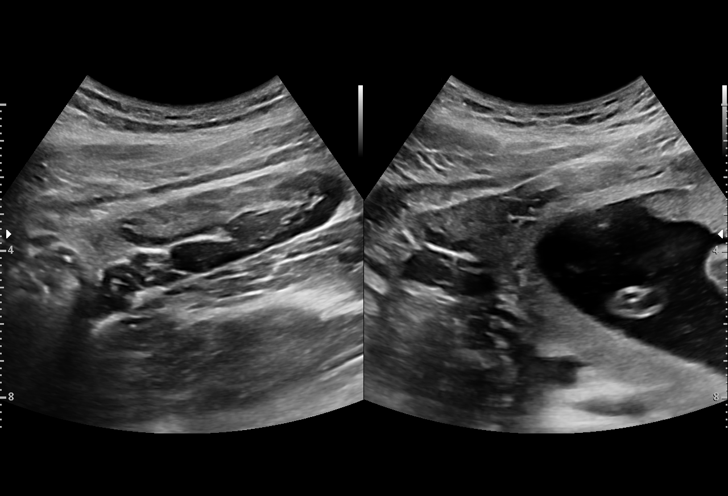

[13 of 28 positions shown; findings below may reference images not displayed]

----------------------------------------------------------------------

 ----------------------------------------------------------------------
Indications

  Encounter for antenatal screening for
  malformations
  19 weeks gestation of pregnancy
 ----------------------------------------------------------------------
Vital Signs

 BMI:
Fetal Evaluation

 Num Of Fetuses:          1
 Fetal Heart Rate(bpm):   150
 Cardiac Activity:        Observed
 Presentation:            Cephalic
 Placenta:                Anterior
 P. Cord Insertion:       Visualized

 Amniotic Fluid
 AFI FV:      Within normal limits

                             Largest Pocket(cm)

Biometry

 BPD:        46  mm     G. Age:  19w 6d         76  %    CI:        73.41   %    70 - 86
                                                         FL/HC:       17.9  %    16.1 -
 HC:      170.6  mm     G. Age:  19w 5d         61  %    HC/AC:       1.27       1.09 -
 AC:      134.2  mm     G. Age:  18w 6d         33  %    FL/BPD:      66.3  %
 FL:       30.5  mm     G. Age:  19w 3d         48  %    FL/AC:       22.7  %    20 - 24
 HUM:      30.8  mm     G. Age:  20w 2d         75  %
 CER:      20.3  mm     G. Age:  19w 2d         50  %
 NFT:       3.5  mm

 LV:        5.6  mm
 CM:        5.2  mm

 Est. FW:     281   gm   0 lb 10 oz      45  %
OB History

 Gravidity:    1
Gestational Age

 LMP:           18w 2d        Date:  06/05/18                 EDD:   03/12/19
 U/S Today:     19w 3d                                        EDD:   03/04/19
 Best:          19w 2d     Det. By:  Early Ultrasound         EDD:   03/05/19
                                     (07/23/18)
Anatomy

 Cranium:               Appears normal         Aortic Arch:            Appears normal
 Cavum:                 Appears normal         Ductal Arch:            Appears normal
 Ventricles:            Appears normal         Diaphragm:              Appears normal
 Choroid Plexus:        Appears normal         Stomach:                Appears normal, left
                                                                       sided
 Cerebellum:            Appears normal         Abdomen:                Appears normal
 Posterior Fossa:       Appears normal         Abdominal Wall:         Appears nml (cord
                                                                       insert, abd wall)
 Nuchal Fold:           Appears normal         Cord Vessels:           Appears normal (3
                                                                       vessel cord)
 Face:                  Appears normal         Kidneys:                Appear normal
                        (orbits and profile)
 Lips:                  Appears normal         Bladder:                Appears normal
 Thoracic:              Appears normal         Spine:                  Appears normal
 Heart:                 Appears normal         Upper Extremities:      Appears normal
                        (4CH, axis, and situs
 RVOT:                  Appears normal         Lower Extremities:      Appears normal
 LVOT:                  Appears normal

 Other:  Male gender. Heels visualized. Open hands visualized. Nasal bone
         visualized.
Cervix Uterus Adnexa

 Cervix
 Length:            3.5  cm.
 Normal appearance by transabdominal scan.

 Uterus
 No abnormality visualized.

 Left Ovary
 Size(cm)       3.4  x   1.1    x  2.7       Vol(ml):
 Within normal limits.

 Right Ovary
 Size(cm)       3.1  x   1.6    x  1.9       Vol(ml):
 Within normal limits.
Impression

 Normal interval growth.  No ultrasonic evidence of structural
 fetal anomalies.
Recommendations

 Follow-up as clinically indicated.

## 2020-06-23 IMAGING — US US MFM OB FOLLOW UP
1 series · 14 of 25 positions shown · non-contrast
Comparison: none

[Series 1: us mfm ob follow up · 25 acquisitions, 14 frames shown]
[im 1/25]
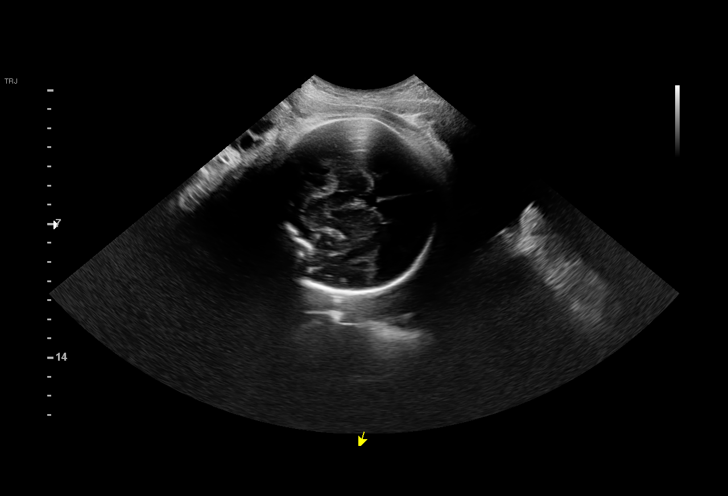
[im 3/25]
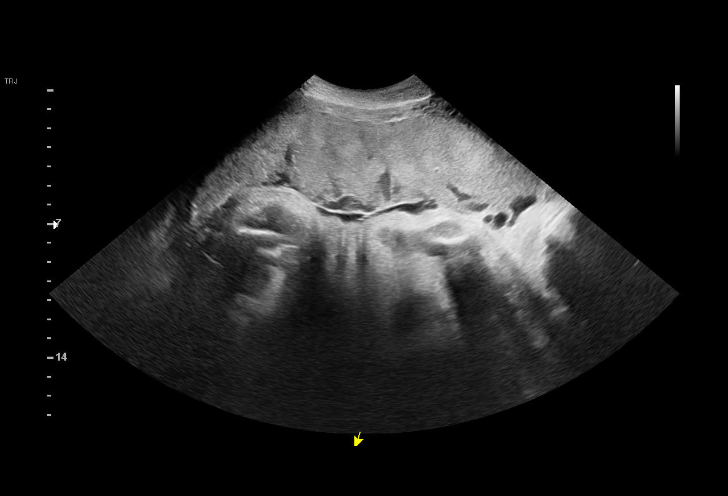
[im 5/25]
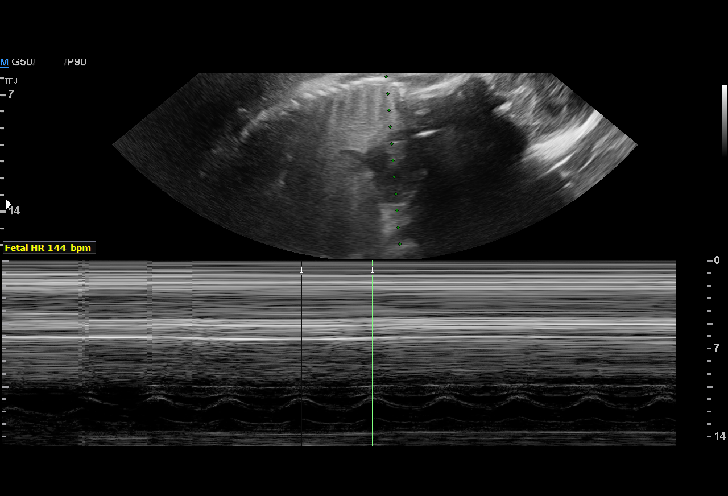
[im 7/25]
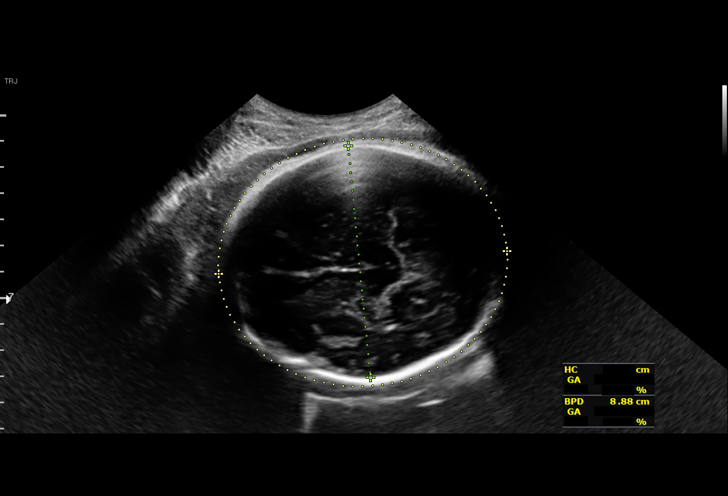
[im 9/25]
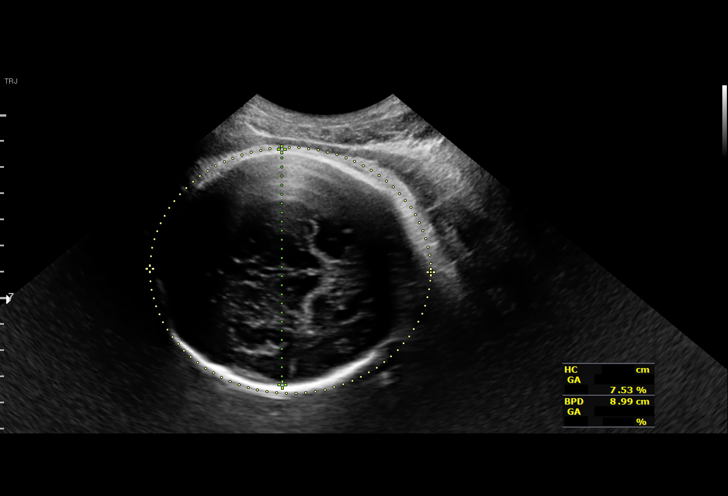
[im 10/25]
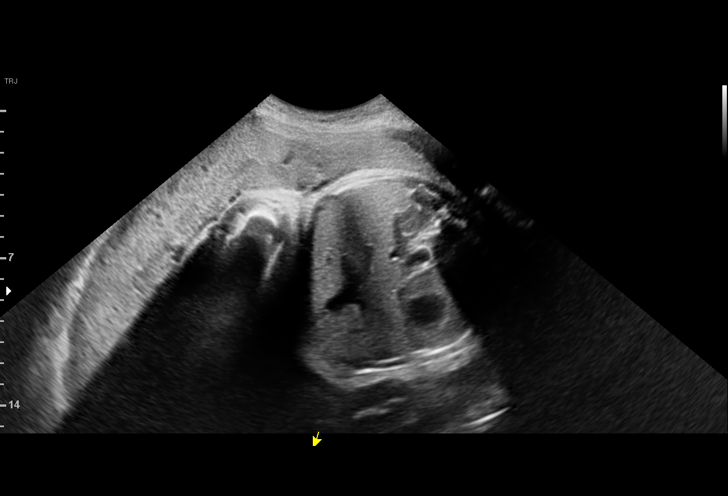
[im 12/25]
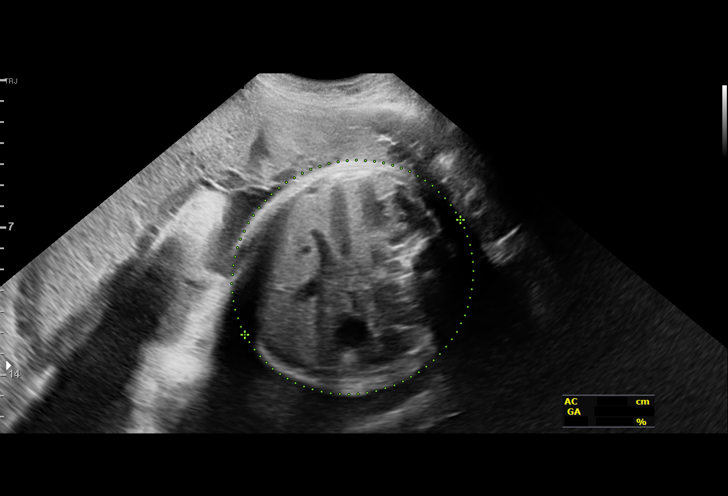
[im 14/25]
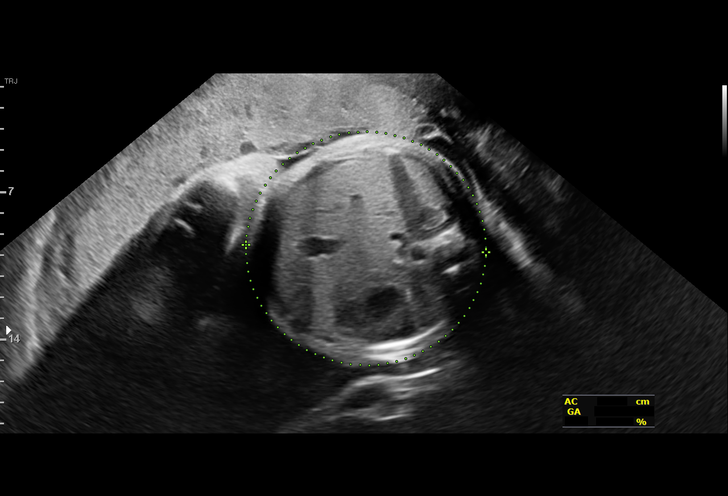
[im 16/25]
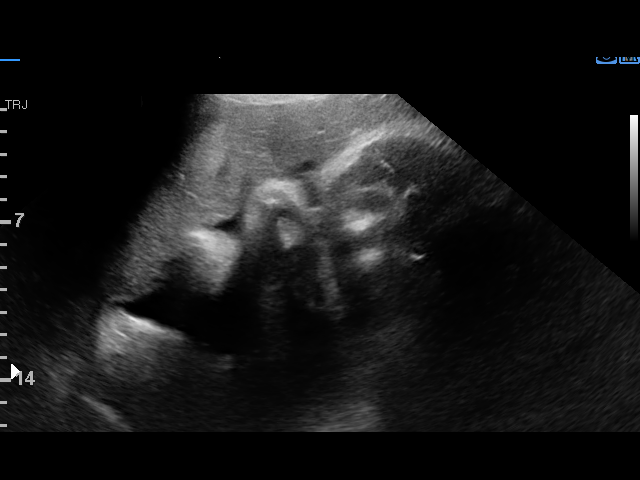
[im 17/25]
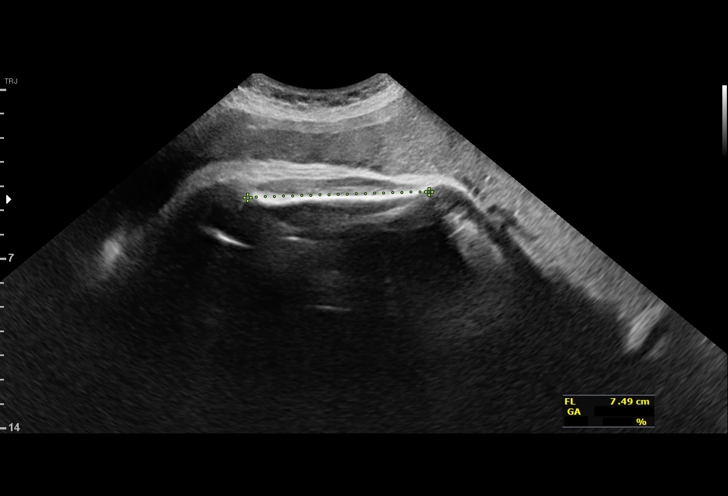
[im 19/25]
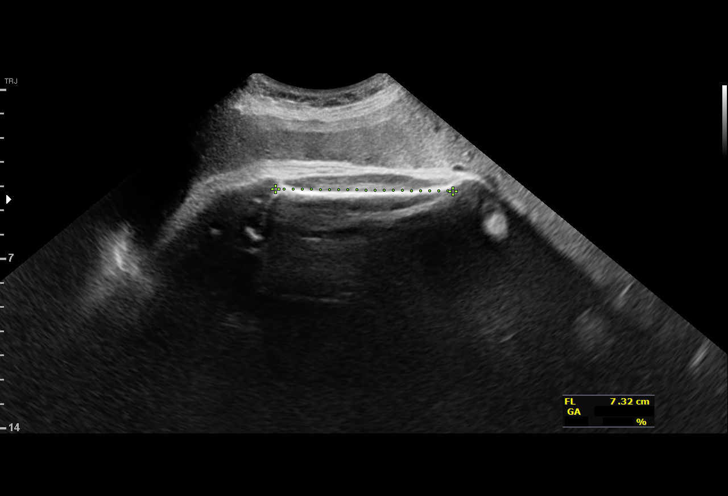
[im 21/25]
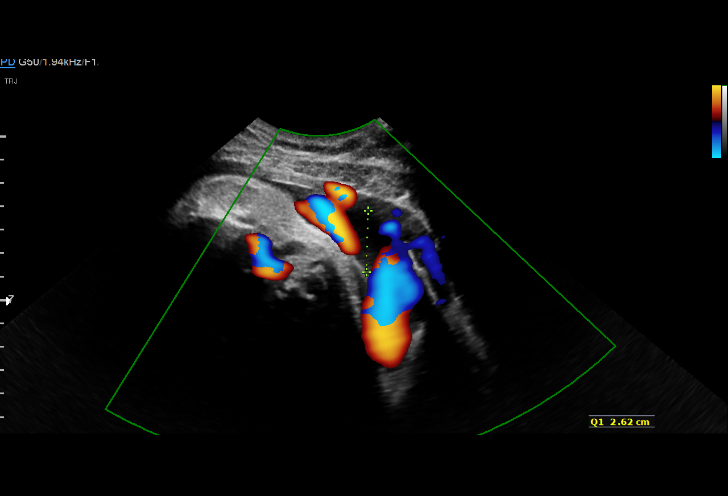
[im 23/25]
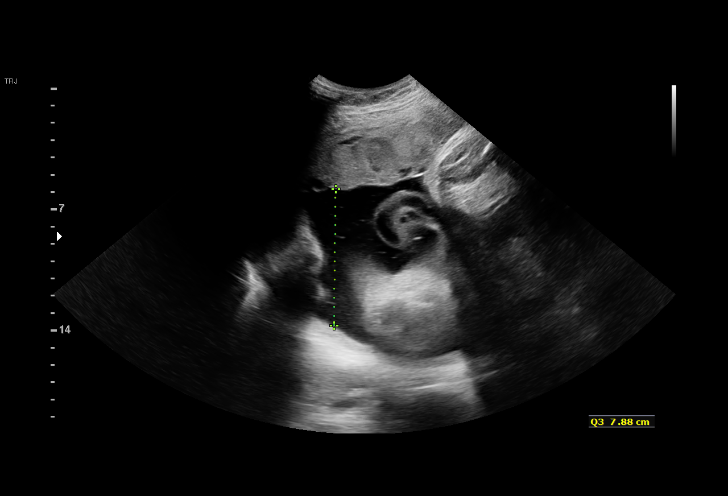
[im 25/25]
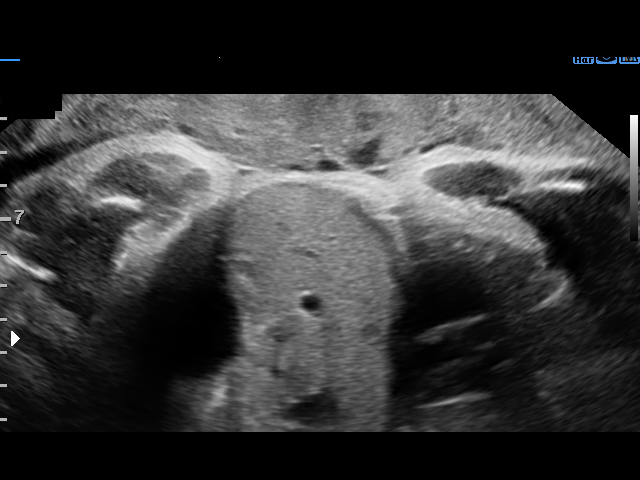

[14 of 25 positions shown; findings below may reference images not displayed]

Name:       RUI RAMALHO MOTA POMBO              Visit Date: 02/09/2019 [REDACTED]

     STRESS                                            JEREMIAH
 ----------------------------------------------------------------------

 ----------------------------------------------------------------------
Indications

  Gestational hypertension without significant
  proteinuria, third trimester
  36 weeks gestation of pregnancy
 ----------------------------------------------------------------------
Vital Signs

                                                Height:        5'7"
Fetal Evaluation

 Num Of Fetuses:         1
 Fetal Heart Rate(bpm):  144
 Cardiac Activity:       Observed
 Presentation:           Cephalic
 Placenta:               Anterior
 P. Cord Insertion:      Previously Visualized

 AFI Sum(cm)     %Tile       Largest Pocket(cm)
 12.26           40

 RUQ(cm)       RLQ(cm)       LUQ(cm)        LLQ(cm)
 2.62          1.76          0
Biophysical Evaluation

 Amniotic F.V:   Pocket => 2 cm two         F. Tone:        Observed
                 planes
 F. Movement:    Observed                   Score:          [DATE]
 F. Breathing:   Observed
Biometry

 BPD:        89  mm     G. Age:  36w 0d         46  %    CI:        77.13   %    70 - 86
                                                         FL/HC:      23.1   %    20.8 -
 HC:      320.9  mm     G. Age:  36w 1d         17  %    HC/AC:      0.91        0.92 -
 AC:      351.7  mm     G. Age:  39w 0d       > 97  %    FL/BPD:     83.3   %    71 - 87
 FL:       74.1  mm     G. Age:  38w 0d         79  %    FL/AC:      21.1   %    20 - 24

 Est. FW:    3358  gm      7 lb 8 oz     90  %
OB History

 Gravidity:    1
Gestational Age

 LMP:           35w 4d        Date:  [DATE]                 EDD:   06/05/18
 U/S Today:     37w 2d                                        EDD:   03/12/19
 Best:          36w 4d     Det. By:  Early Ultrasound         EDD:   02/28/19
                                     (03/05/19)
Anatomy

 Cranium:               Appears normal         Stomach:                Appears normal, left
                                                                       sided
 Cavum:                 Appears normal         Kidneys:                Appear normal
 Ventricles:            Previously seen        Bladder:                Appears normal
 Heart:                 Previously seen
Impression

 Gestational hypertension
 Normal interval growth.
 Biophysical profile 07/23/18
Recommendations

 Scheduled for IOL on [DATE]

## 2020-06-26 ENCOUNTER — Other Ambulatory Visit: Payer: Self-pay

## 2020-06-26 ENCOUNTER — Ambulatory Visit (INDEPENDENT_AMBULATORY_CARE_PROVIDER_SITE_OTHER): Payer: Medicaid Other | Admitting: Family Medicine

## 2020-06-26 ENCOUNTER — Encounter: Payer: Self-pay | Admitting: Family Medicine

## 2020-06-26 VITALS — BP 118/76 | HR 80 | Ht 67.5 in | Wt 192.2 lb

## 2020-06-26 DIAGNOSIS — Z8759 Personal history of other complications of pregnancy, childbirth and the puerperium: Secondary | ICD-10-CM | POA: Insufficient documentation

## 2020-06-26 DIAGNOSIS — Z30432 Encounter for removal of intrauterine contraceptive device: Secondary | ICD-10-CM | POA: Diagnosis not present

## 2020-06-26 DIAGNOSIS — Z30016 Encounter for initial prescription of transdermal patch hormonal contraceptive device: Secondary | ICD-10-CM | POA: Diagnosis not present

## 2020-06-26 LAB — POCT URINE PREGNANCY: Preg Test, Ur: NEGATIVE

## 2020-06-26 MED ORDER — NORELGESTROMIN-ETH ESTRADIOL 150-35 MCG/24HR TD PTWK: 1.0000 | MEDICATED_PATCH | TRANSDERMAL | 12 refills | Status: DC

## 2020-06-26 NOTE — Progress Notes (Signed)
Pt is in the office for IUD removal. Pt states that she got pregnant with the IUD and had an abortion on 05-26-20. Pt would like to switch to [facility] patch.

## 2020-06-26 NOTE — Progress Notes (Signed)
° ° °  Subjective:    Patient ID: Kathryn Pena is a 19 y.o. female presenting with IUD Removal  on 06/26/2020  HPI: Patient is here today for IUD removal.  The patient had a baby in 2020 and had a possible subtle IUD placed.  Her IUD worked well until it failed in the last couple months.  She had been medical termination of pregnancy.  She would like her IUD removed since it failed and to try the patch.  Review of Systems  Constitutional: Negative for chills and fever.  Respiratory: Negative for shortness of breath.   Cardiovascular: Negative for chest pain.  Gastrointestinal: Negative for abdominal pain, nausea and vomiting.  Genitourinary: Negative for dysuria.  Skin: Negative for rash.      Objective:    BP 118/76    Pulse 80    Ht 5' 7.5" (1.715 m)    Wt 192 lb 3.2 oz (87.2 kg)    BMI 29.66 kg/m  Physical Exam Constitutional:      General: She is not in acute distress.    Appearance: She is well-developed.  HENT:     Head: Normocephalic and atraumatic.  Eyes:     General: No scleral icterus. Cardiovascular:     Rate and Rhythm: Normal rate.  Pulmonary:     Effort: Pulmonary effort is normal.  Abdominal:     Palpations: Abdomen is soft.  Musculoskeletal:     Cervical back: Neck supple.  Skin:    General: Skin is warm and dry.  Neurological:     Mental Status: She is alert and oriented to person, place, and time.     Procedure: Speculum placed inside vagina.  Cervix visualized.  Strings grasped with ring forceps.  IUD removed intact.     Assessment & Plan:  Encounter for initial prescription of transdermal patch hormonal contraceptive device - begin patch--risks reviewed - Plan: POCT urine pregnancy, norelgestromin-ethinyl estradiol (ORTHO EVRA) 150-35 MCG/24HR transdermal patch  Encounter for IUD removal   Total time in review of prior notes, pathology, labs, history taking, review with patient, exam, note writing, discussion of options, plan for next  steps, alternatives and risks of treatment: 12 minutes.  No follow-ups on file.  Reva Bores 06/26/2020 3:42 PM

## 2020-09-13 ENCOUNTER — Ambulatory Visit
Admission: EM | Admit: 2020-09-13 | Discharge: 2020-09-13 | Disposition: A | Discharge status: Home / Self Care | Payer: Medicaid Other | Attending: Emergency Medicine | Admitting: Emergency Medicine

## 2020-09-13 ENCOUNTER — Other Ambulatory Visit: Payer: Self-pay

## 2020-09-13 DIAGNOSIS — J039 Acute tonsillitis, unspecified: Secondary | ICD-10-CM | POA: Insufficient documentation

## 2020-09-13 DIAGNOSIS — R0789 Other chest pain: Secondary | ICD-10-CM | POA: Diagnosis not present

## 2020-09-13 LAB — POCT RAPID STREP A (OFFICE): Rapid Strep A Screen: NEGATIVE

## 2020-09-13 MED ORDER — IBUPROFEN 100 MG/5ML PO SUSP: 600.0000 mg | Freq: Four times a day (QID) | ORAL | 0 refills | Status: DC | PRN

## 2020-09-13 MED ORDER — KETOROLAC TROMETHAMINE 30 MG/ML IJ SOLN
30.0000 mg | Freq: Once | INTRAMUSCULAR | Status: AC
  Administered 2020-09-13: 30 mg via INTRAMUSCULAR

## 2020-09-13 MED ORDER — AMOXICILLIN 400 MG/5ML PO SUSR: 1000.0000 mg | Freq: Two times a day (BID) | ORAL | 0 refills | Status: AC

## 2020-09-13 NOTE — Discharge Instructions (Signed)
Strep test negative, mono test pending, I will call if this is positive Go ahead and begin amoxicillin twice daily for the next 10 days to treat tonsillitis Tylenol and ibuprofen for pain and swelling and fevers EKG normal Continue to monitor chest tightness, monitor to improve with toradol-if worsening please go to the emergency room Rest and drink plenty of fluids Follow-up if not improving or worsening

## 2020-09-13 NOTE — ED Triage Notes (Addendum)
Pt c/o sore throat, difficulty to swallow or talk, neck pain radiating down to mid back, and headache x3 day. States had a neg rapid covid test last night.

## 2020-09-14 NOTE — ED Provider Notes (Signed)
[name]    CSN: [id number] Arrival date & time: 09/13/20  1752      History   Chief Complaint Chief Complaint  Patient presents with  . Sore Throat    HPI Kathryn Pena is a 19 y.o. female presenting today for evaluation of sore throat.  Patient reports that over the past 3 days she has had sore throat, pain with swallowing as well as neck pain.  Pain in the neck radiates into her back.  Negative Covid test last night.  Denies rhinorrhea or cough.  Since patient has been at urgent care she has had a central chest tightness which has been constant for the past hour.  Does radiate towards her back as well.  Denies history of heart problems, tobacco use.  Denies shortness of breath.  Denies leg pain or leg swelling.  HPI  Past Medical History:  Diagnosis Date  . Pregnancy induced hypertension     Patient Active Problem List   Diagnosis Date Noted  . History of pre-eclampsia 06/26/2020    Past Surgical History:  Procedure Laterality Date  . NO PAST SURGERIES      OB History    Gravida  2   Para  1   Term  1   Preterm      AB  1   Living  1     SAB      TAB  1   Ectopic      Multiple  0   Live Births  1            Home Medications    Prior to Admission medications   Medication Sig Start Date End Date Taking? Authorizing Provider  amoxicillin (AMOXIL) 400 MG/5ML suspension Take 12.5 mLs (1,000 mg total) by mouth 2 (two) times daily for 10 days. 09/13/20 09/23/20  Sherilee Smotherman C, PA-C  ibuprofen (ADVIL) 100 MG/5ML suspension Take 30 mLs (600 mg total) by mouth every 6 (six) hours as needed. 09/13/20   Kayde Atkerson C, PA-C  norelgestromin-ethinyl estradiol (ORTHO EVRA) 150-35 MCG/24HR transdermal patch Place 1 patch onto the skin once a week. 06/26/20   Reva Bores, MD  enalapril (VASOTEC) 5 MG tablet Take 1 tablet (5 mg total) by mouth daily. 02/24/19 12/21/19  Hermina Staggers, MD    Family History Family History    Problem Relation Age of Onset  . Healthy Mother     Social History Social History   Tobacco Use  . Smoking status: Former Smoker    Types: Cigars    Quit date: 06/27/2018    Years since quitting: 2.2  . Smokeless tobacco: Never Used  Vaping Use  . Vaping Use: Never used  Substance Use Topics  . Alcohol use: No  . Drug use: No     Allergies   Patient has no known allergies.   Review of Systems Review of Systems  Constitutional: Negative for activity change, appetite change, chills, fatigue and fever.  HENT: Positive for sore throat. Negative for congestion, ear pain, rhinorrhea, sinus pressure and trouble swallowing.   Eyes: Negative for discharge and redness.  Respiratory: Positive for chest tightness. Negative for cough and shortness of breath.   Cardiovascular: Negative for chest pain.  Gastrointestinal: Negative for abdominal pain, diarrhea, nausea and vomiting.  Musculoskeletal: Negative for myalgias.  Skin: Negative for rash.  Neurological: Negative for dizziness, light-headedness and headaches.     Physical Exam Triage Vital Signs ED Triage Vitals  Enc  Vitals Group     BP 09/13/20 1909 121/78     Pulse Rate 09/13/20 1909 98     Resp 09/13/20 1909 18     Temp 09/13/20 1909 (!) 100.4 F (38 C)     Temp Source 09/13/20 1909 Oral     SpO2 09/13/20 1909 99 %     Weight --      Height --      Head Circumference --      Peak Flow --      Pain Score 09/13/20 1922 10     Pain Loc --      Pain Edu? --      Excl. in GC? --    No data found.  Updated Vital Signs BP 121/78 (BP Location: Left Arm)   Pulse 98   Temp (!) 100.4 F (38 C) (Oral)   Resp 18   LMP 08/20/2020 Comment: birth control patch  SpO2 99%   Breastfeeding No   Visual Acuity Right Eye Distance:   Left Eye Distance:   Bilateral Distance:    Right Eye Near:   Left Eye Near:    Bilateral Near:     Physical Exam Vitals and nursing note reviewed.  Constitutional:      Appearance:  She is well-developed.     Comments: No acute distress  HENT:     Head: Normocephalic and atraumatic.     Ears:     Comments: Bilateral ears without tenderness to palpation of external auricle, tragus and mastoid, EAC's without erythema or swelling, TM's with good bony landmarks and cone of light. Non erythematous.     Nose: Nose normal.     Mouth/Throat:     Comments: Oral mucosa pink and moist, bilateral tonsils enlarged with deep erythema and exudate bilaterally ,posterior pharynx patent and nonerythematous, no uvula deviation or swelling. Normal phonation.  Eyes:     Conjunctiva/sclera: Conjunctivae normal.  Neck:     Comments: Right lower posterior cervical lymphadenopathy, full active range of motion of the neck although does elicit pain Cardiovascular:     Rate and Rhythm: Normal rate.  Pulmonary:     Effort: Pulmonary effort is normal. No respiratory distress.     Comments: Breathing comfortably at rest, CTABL, no wheezing, rales or other adventitious sounds auscultated  Abdominal:     General: There is no distension.  Musculoskeletal:        General: Normal range of motion.     Cervical back: Neck supple.  Skin:    General: Skin is warm and dry.  Neurological:     Mental Status: She is alert and oriented to person, place, and time.      UC Treatments / Results  Labs (all labs ordered are listed, but only abnormal results are displayed) Labs Reviewed  CULTURE, GROUP A STREP (THRC)  EPSTEIN-BARR VIRUS VCA, IGM  POCT RAPID STREP A (OFFICE)    EKG   Radiology No results found.  Procedures Procedures (including critical care time)  Medications Ordered in UC Medications  ketorolac (TORADOL) 30 MG/ML injection 30 mg (30 mg Intramuscular Given 09/13/20 2020)    Initial Impression / Assessment and Plan / UC Course  I have reviewed the triage vital signs and the nursing notes.  Pertinent labs & imaging results that were available during my care of the patient  were reviewed by me and considered in my medical decision making (see chart for details).    Clinical presentation of tonsillitis-strep test  negative, mono pending.  Initiating on treatment with antibiotic therapy given appearance of tonsils will discontinue if mono positive.  Symptomatic and supportive care as well.  Chest tightness x1 hour, not reproducible, lungs clear to auscultation, no risk factors for ACS.  EKG normal sinus rhythm.  Providing Toradol to help with throat neck and chest pain, monitor for improvement, if chest pain worsening or changing to follow-up in emergency room.  Discussed strict return precautions. Patient verbalized understanding and is agreeable with plan.   Final Clinical Impressions(s) / UC Diagnoses   Final diagnoses:  Acute tonsillitis, unspecified etiology  Chest tightness     Discharge Instructions     Strep test negative, mono test pending, I will call if this is positive Go ahead and begin amoxicillin twice daily for the next 10 days to treat tonsillitis Tylenol and ibuprofen for pain and swelling and fevers EKG normal Continue to monitor chest tightness, monitor to improve with toradol-if worsening please go to the emergency room Rest and drink plenty of fluids Follow-up if not improving or worsening    ED Prescriptions    Medication Sig Dispense Auth. Provider   amoxicillin (AMOXIL) 400 MG/5ML suspension Take 12.5 mLs (1,000 mg total) by mouth 2 (two) times daily for 10 days. 250 mL Aryannah Mohon C, PA-C   ibuprofen (ADVIL) 100 MG/5ML suspension Take 30 mLs (600 mg total) by mouth every 6 (six) hours as needed. 473 mL Missey Hasley, [place] C, PA-C     PDMP not reviewed this encounter.   Lew Dawes, PA-C 09/14/20 1113

## 2020-09-15 LAB — EPSTEIN-BARR VIRUS VCA, IGM: EBV VCA IgM: <36 U/mL (ref 0.0–35.9)

## 2020-09-15 LAB — CULTURE, GROUP A STREP (THRC)

## 2020-09-16 LAB — CULTURE, GROUP A STREP (THRC)

## 2020-12-07 ENCOUNTER — Ambulatory Visit: Payer: Medicaid Other | Admitting: Obstetrics

## 2021-07-18 ENCOUNTER — Other Ambulatory Visit: Payer: Self-pay | Admitting: Family Medicine

## 2021-07-18 DIAGNOSIS — Z30016 Encounter for initial prescription of transdermal patch hormonal contraceptive device: Secondary | ICD-10-CM

## 2021-08-05 ENCOUNTER — Encounter: Payer: Self-pay | Admitting: Obstetrics and Gynecology

## 2021-08-05 ENCOUNTER — Other Ambulatory Visit: Payer: Self-pay

## 2021-08-05 ENCOUNTER — Other Ambulatory Visit (HOSPITAL_COMMUNITY)
Admission: RE | Admit: 2021-08-05 | Discharge: 2021-08-05 | Disposition: A | Discharge status: Home / Self Care | Payer: Medicaid Other | Source: Ambulatory Visit | Attending: Obstetrics | Admitting: Obstetrics

## 2021-08-05 ENCOUNTER — Ambulatory Visit (INDEPENDENT_AMBULATORY_CARE_PROVIDER_SITE_OTHER): Payer: Medicaid Other | Admitting: Obstetrics and Gynecology

## 2021-08-05 DIAGNOSIS — Z202 Contact with and (suspected) exposure to infections with a predominantly sexual mode of transmission: Secondary | ICD-10-CM | POA: Insufficient documentation

## 2021-08-05 DIAGNOSIS — Z3045 Encounter for surveillance of transdermal patch hormonal contraceptive device: Secondary | ICD-10-CM

## 2021-08-05 DIAGNOSIS — Z01419 Encounter for gynecological examination (general) (routine) without abnormal findings: Secondary | ICD-10-CM | POA: Insufficient documentation

## 2021-08-05 DIAGNOSIS — Z30016 Encounter for initial prescription of transdermal patch hormonal contraceptive device: Secondary | ICD-10-CM | POA: Diagnosis not present

## 2021-08-05 DIAGNOSIS — Z309 Encounter for contraceptive management, unspecified: Secondary | ICD-10-CM | POA: Insufficient documentation

## 2021-08-05 MED ORDER — XULANE 150-35 MCG/24HR TD PTWK: MEDICATED_PATCH | TRANSDERMAL | 12 refills | Status: DC

## 2021-08-05 NOTE — Patient Instructions (Signed)
Health Maintenance, Female Adopting a healthy lifestyle and getting preventive care are important in promoting health and wellness. Ask your health care provider about: The right schedule for you to have regular tests and exams. Things you can do on your own to prevent diseases and keep yourself healthy. What should I know about diet, weight, and exercise? Eat a healthy diet  Eat a diet that includes plenty of vegetables, fruits, low-fat dairy products, and lean protein. Do not eat a lot of foods that are high in solid fats, added sugars, or sodium. Maintain a healthy weight Body mass index (BMI) is used to identify weight problems. It estimates body fat based on height and weight. Your health care provider can help determine your BMI and help you achieve or maintain a healthy weight. Get regular exercise Get regular exercise. This is one of the most important things you can do for your health. Most adults should: Exercise for at least 150 minutes each week. The exercise should increase your heart rate and make you sweat (moderate-intensity exercise). Do strengthening exercises at least twice a week. This is in addition to the moderate-intensity exercise. Spend less time sitting. Even light physical activity can be beneficial. Watch cholesterol and blood lipids Have your blood tested for lipids and cholesterol at 20 years of age, then have this test every 5 years. Have your cholesterol levels checked more often if: Your lipid or cholesterol levels are high. You are older than 21 years of age. You are at high risk for heart disease. What should I know about cancer screening? Depending on your health history and family history, you may need to have cancer screening at various ages. This may include screening for: Breast cancer. Cervical cancer. Colorectal cancer. Skin cancer. Lung cancer. What should I know about heart disease, diabetes, and high blood pressure? Blood pressure and heart  disease High blood pressure causes heart disease and increases the risk of stroke. This is more likely to develop in people who have high blood pressure readings, are of African descent, or are overweight. Have your blood pressure checked: Every 3-5 years if you are 18-39 years of age. Every year if you are 40 years old or older. Diabetes Have regular diabetes screenings. This checks your fasting blood sugar level. Have the screening done: Once every three years after age 40 if you are at a normal weight and have a low risk for diabetes. More often and at a younger age if you are overweight or have a high risk for diabetes. What should I know about preventing infection? Hepatitis B If you have a higher risk for hepatitis B, you should be screened for this virus. Talk with your health care provider to find out if you are at risk for hepatitis B infection. Hepatitis C Testing is recommended for: Everyone born from 1945 through 1965. Anyone with known risk factors for hepatitis C. Sexually transmitted infections (STIs) Get screened for STIs, including gonorrhea and chlamydia, if: You are sexually active and are younger than 21 years of age. You are older than 21 years of age and your health care provider tells you that you are at risk for this type of infection. Your sexual activity has changed since you were last screened, and you are at increased risk for chlamydia or gonorrhea. Ask your health care provider if you are at risk. Ask your health care provider about whether you are at high risk for HIV. Your health care provider may recommend a prescription medicine   to help prevent HIV infection. If you choose to take medicine to prevent HIV, you should first get tested for HIV. You should then be tested every 3 months for as long as you are taking the medicine. Pregnancy If you are about to stop having your period (premenopausal) and you may become pregnant, seek counseling before you get  pregnant. Take 400 to 800 micrograms (mcg) of folic acid every day if you become pregnant. Ask for birth control (contraception) if you want to prevent pregnancy. Osteoporosis and menopause Osteoporosis is a disease in which the bones lose minerals and strength with aging. This can result in bone fractures. If you are 65 years old or older, or if you are at risk for osteoporosis and fractures, ask your health care provider if you should: Be screened for bone loss. Take a calcium or vitamin D supplement to lower your risk of fractures. Be given hormone replacement therapy (HRT) to treat symptoms of menopause. Follow these instructions at home: Lifestyle Do not use any products that contain nicotine or tobacco, such as cigarettes, e-cigarettes, and chewing tobacco. If you need help quitting, ask your health care provider. Do not use street drugs. Do not share needles. Ask your health care provider for help if you need support or information about quitting drugs. Alcohol use Do not drink alcohol if: Your health care provider tells you not to drink. You are pregnant, may be pregnant, or are planning to become pregnant. If you drink alcohol: Limit how much you use to 0-1 drink a day. Limit intake if you are breastfeeding. Be aware of how much alcohol is in your drink. In the [place] one drink equals one 12 oz bottle of beer (355 mL), one 5 oz glass of wine (148 mL), or one 1 oz glass of hard liquor (44 mL). General instructions Schedule regular health, dental, and eye exams. Stay current with your vaccines. Tell your health care provider if: You often feel depressed. You have ever been abused or do not feel safe at home. Summary Adopting a healthy lifestyle and getting preventive care are important in promoting health and wellness. Follow your health care provider's instructions about healthy diet, exercising, and getting tested or screened for diseases. Follow your health care provider's  instructions on monitoring your cholesterol and blood pressure. This information is not intended to replace advice given to you by your health care provider. Make sure you discuss any questions you have with your health care provider. Document Revised: 12/07/2020 Document Reviewed: 09/22/2018 Elsevier Patient Education  2022 Elsevier Inc.  

## 2021-08-05 NOTE — Progress Notes (Signed)
No concerns. 

## 2021-08-05 NOTE — Progress Notes (Addendum)
Patient ID: Kathryn Pena, female   DOB: 2001/03/11, 20 y.o.   MRN: [id number]  Kathryn Pena is a 20 y.o. [id number] female here for a routine annual gynecologic exam.  Current complaints: none.   Denies abnormal vaginal bleeding, discharge, pelvic pain, problems with intercourse or other gynecologic concerns.    Gynecologic History Patient's last menstrual period was 07/12/2021. Contraception: Ortho-Evra patches weekly    Obstetric History OB History  Gravida Para Term Preterm AB Living  2 1 1   1 1   SAB IAB Ectopic Multiple Live Births    1   0 1    # Outcome Date GA Lbr Len/2nd Weight Sex Delivery Anes PTL Lv  2 IAB 05/26/20 [redacted]w[redacted]d         1 Term 02/14/19 [redacted]w[redacted]d 27:36 / 03:34 6 lb 14.8 oz (3.141 kg) M Vag-Spont EPI, Local  LIV    Past Medical History:  Diagnosis Date   Pregnancy induced hypertension     Past Surgical History:  Procedure Laterality Date   NO PAST SURGERIES      Current Outpatient Medications on File Prior to Visit  Medication Sig Dispense Refill   XULANE 150-35 MCG/24HR transdermal patch APPLY 1 PATCH TOPICALLY TO THE SKIN 1 TIME A WEEK 9 patch 12   [DISCONTINUED] enalapril (VASOTEC) 5 MG tablet Take 1 tablet (5 mg total) by mouth daily. 30 tablet 0   No current facility-administered medications on file prior to visit.    No Known Allergies  Social History   Socioeconomic History   Marital status: Single    Spouse name: Not on file   Number of children: Not on file   Years of education: Not on file   Highest education level: Not on file  Occupational History   Not on file  Tobacco Use   Smoking status: Former    Types: Cigars    Quit date: 06/27/2018    Years since quitting: 3.1   Smokeless tobacco: Never  Vaping Use   Vaping Use: Never used  Substance and Sexual Activity   Alcohol use: No   Drug use: No   Sexual activity: Yes    Birth control/protection: Patch  Other Topics Concern   Not on file  Social History Narrative   Not  on file   Social Determinants of Health   Financial Resource Strain: Not on file  Food Insecurity: Not on file  Transportation Needs: Not on file  Physical Activity: Not on file  Stress: Not on file  Social Connections: Not on file  Intimate Partner Violence: Not on file    Family History  Problem Relation Age of Onset   Healthy Mother     The following portions of the patient's history were reviewed and updated as appropriate: allergies, current medications, past family history, past medical history, past social history, past surgical history and problem list.  Review of Systems Pertinent items noted in HPI and remainder of comprehensive ROS otherwise negative.   Objective:  BP 123/79   Pulse 79   Wt 218 lb 8 oz (99.1 kg)   LMP 07/12/2021   BMI 33.72 kg/m  Chaperone present during exam CONSTITUTIONAL: Well-developed, well-nourished female in no acute distress.  HENT:  Normocephalic, atraumatic, External right and left ear normal. Oropharynx is clear and moist EYES: Conjunctivae and EOM are normal. Pupils are equal, round, and reactive to light. No scleral icterus.  NECK: Normal range of motion, supple, no masses.  Normal thyroid.  SKIN: Skin  is warm and dry. No rash noted. Not diaphoretic. No erythema. No pallor. NEUROLGIC: Alert and oriented to person, place, and time. Normal reflexes, muscle tone coordination. No cranial nerve deficit noted. PSYCHIATRIC: Normal mood and affect. Normal behavior. Normal judgment and thought content. CARDIOVASCULAR: Normal heart rate noted, regular rhythm RESPIRATORY: Clear to auscultation bilaterally. Effort and breath sounds normal, no problems with respiration noted. BREASTS: Symmetric in size. No masses, skin changes, nipple drainage, or lymphadenopathy. ABDOMEN: Soft, normal bowel sounds, no distention noted.  No tenderness, rebound or guarding.  PELVIC: Normal appearing external genitalia; normal appearing vaginal mucosa and cervix.   No abnormal discharge noted.  Pap smear obtained.  Normal uterine size, no other palpable masses, no uterine or adnexal tenderness. MUSCULOSKELETAL: Normal range of motion. No tenderness.  No cyanosis, clubbing, or edema.  2+ distal pulses.   Assessment:  Annual gynecologic examination with pap smear STD exposure Contraception management Plan:  Will follow up results of pap smear and manage accordingly. STD testing as per pt request. Decline blood testing. Declined flu and TDAP vaccine Routine preventative health maintenance measures emphasized. Please refer to After Visit Summary for other counseling recommendations.    Hermina Staggers, MD, FACOG Attending Obstetrician & Gynecologist Center for [facility], [facility]

## 2021-08-06 LAB — CERVICOVAGINAL ANCILLARY ONLY
Bacterial Vaginitis (gardnerella): NEGATIVE
Candida Glabrata: NEGATIVE
Candida Vaginitis: POSITIVE — AB
Chlamydia: NEGATIVE
Comment: NEGATIVE
Comment: NEGATIVE
Comment: NEGATIVE
Comment: NEGATIVE
Comment: NEGATIVE
Comment: NORMAL
Neisseria Gonorrhea: NEGATIVE
Trichomonas: NEGATIVE

## 2021-08-06 LAB — CYTOLOGY - PAP: Diagnosis: NEGATIVE

## 2021-08-07 ENCOUNTER — Other Ambulatory Visit: Payer: Self-pay

## 2021-08-07 DIAGNOSIS — B379 Candidiasis, unspecified: Secondary | ICD-10-CM

## 2021-08-07 MED ORDER — FLUCONAZOLE 150 MG PO TABS: 150.0000 mg | ORAL_TABLET | Freq: Once | ORAL | 0 refills | Status: AC

## 2021-09-02 ENCOUNTER — Ambulatory Visit (INDEPENDENT_AMBULATORY_CARE_PROVIDER_SITE_OTHER): Payer: Medicaid Other | Admitting: *Deleted

## 2021-09-02 ENCOUNTER — Other Ambulatory Visit: Payer: Self-pay

## 2021-09-02 VITALS — BP 144/81 | HR 75 | Ht 68.0 in | Wt 216.6 lb

## 2021-09-02 DIAGNOSIS — N912 Amenorrhea, unspecified: Secondary | ICD-10-CM | POA: Diagnosis not present

## 2021-09-02 DIAGNOSIS — Z3201 Encounter for pregnancy test, result positive: Secondary | ICD-10-CM

## 2021-09-02 DIAGNOSIS — Z3480 Encounter for supervision of other normal pregnancy, unspecified trimester: Secondary | ICD-10-CM

## 2021-09-02 LAB — POCT URINE PREGNANCY: Preg Test, Ur: POSITIVE — AB

## 2021-09-02 MED ORDER — VITAFOL GUMMIES 3.33-0.333-34.8 MG PO CHEW: 1.0000 | CHEWABLE_TABLET | Freq: Every day | ORAL | 5 refills | Status: DC

## 2021-09-02 MED ORDER — DOXYLAMINE-PYRIDOXINE 10-10 MG PO TBEC: 2.0000 | DELAYED_RELEASE_TABLET | Freq: Every day | ORAL | 5 refills | Status: DC

## 2021-09-02 MED ORDER — PROMETHAZINE HCL 25 MG PO TABS: 25.0000 mg | ORAL_TABLET | Freq: Four times a day (QID) | ORAL | 1 refills | Status: DC | PRN

## 2021-09-02 NOTE — Progress Notes (Signed)
Ms. Hauth presents today for UPT. She has no unusual complaints. LMP: 06/18/21    OBJECTIVE: Appears well, in no apparent distress.  OB History     Gravida  3   Para  1   Term  1   Preterm      AB  1   Living  1      SAB      IAB  1   Ectopic      Multiple  0   Live Births  1          Home UPT Result: positive In-Office UPT result: positive I have reviewed the patient's medical, obstetrical, social, and family histories, and medications.   ASSESSMENT: Positive pregnancy test  PLAN Prenatal care to be completed at: [facility], [facility], and [facility] sent.

## 2021-09-02 NOTE — Addendum Note (Signed)
Addended by: Harrel Lemon on: 09/02/2021 03:52 PM   Modules accepted: Orders

## 2021-09-10 ENCOUNTER — Other Ambulatory Visit: Payer: Self-pay

## 2021-09-10 ENCOUNTER — Telehealth: Payer: Self-pay

## 2021-09-10 ENCOUNTER — Encounter (HOSPITAL_COMMUNITY): Payer: Self-pay | Admitting: Obstetrics and Gynecology

## 2021-09-10 ENCOUNTER — Inpatient Hospital Stay (HOSPITAL_COMMUNITY): Payer: Medicaid Other

## 2021-09-10 ENCOUNTER — Inpatient Hospital Stay (HOSPITAL_COMMUNITY)
Admission: AD | Admit: 2021-09-10 | Discharge: 2021-09-10 | Disposition: A | Discharge status: Home / Self Care | Payer: Medicaid Other | Attending: Obstetrics and Gynecology | Admitting: Obstetrics and Gynecology

## 2021-09-10 DIAGNOSIS — R1031 Right lower quadrant pain: Secondary | ICD-10-CM | POA: Diagnosis not present

## 2021-09-10 DIAGNOSIS — Z3A08 8 weeks gestation of pregnancy: Secondary | ICD-10-CM | POA: Diagnosis not present

## 2021-09-10 DIAGNOSIS — Z87891 Personal history of nicotine dependence: Secondary | ICD-10-CM | POA: Insufficient documentation

## 2021-09-10 DIAGNOSIS — O26891 Other specified pregnancy related conditions, first trimester: Secondary | ICD-10-CM | POA: Diagnosis present

## 2021-09-10 DIAGNOSIS — K529 Noninfective gastroenteritis and colitis, unspecified: Secondary | ICD-10-CM | POA: Diagnosis not present

## 2021-09-10 DIAGNOSIS — R109 Unspecified abdominal pain: Secondary | ICD-10-CM

## 2021-09-10 DIAGNOSIS — O99611 Diseases of the digestive system complicating pregnancy, first trimester: Secondary | ICD-10-CM | POA: Insufficient documentation

## 2021-09-10 DIAGNOSIS — O26899 Other specified pregnancy related conditions, unspecified trimester: Secondary | ICD-10-CM

## 2021-09-10 LAB — WET PREP, GENITAL
Clue Cells Wet Prep HPF POC: NONE SEEN
Sperm: NONE SEEN
Trich, Wet Prep: NONE SEEN
WBC, Wet Prep HPF POC: <10 (ref <10)
Yeast Wet Prep HPF POC: NONE SEEN

## 2021-09-10 LAB — CBC
HCT: 37.7 % (ref 36.0–46.0)
Hemoglobin: 12.8 g/dL (ref 12.0–15.0)
MCH: 30.1 pg (ref 26.0–34.0)
MCHC: 34 g/dL (ref 30.0–36.0)
MCV: 88.7 fL (ref 80.0–100.0)
Platelets: 255 10*3/uL (ref 150–400)
RBC: 4.25 MIL/uL (ref 3.87–5.11)
RDW: 12.9 % (ref 11.5–15.5)
WBC: 5.2 10*3/uL (ref 4.0–10.5)
nRBC: 0 % (ref 0.0–0.2)

## 2021-09-10 LAB — URINALYSIS, ROUTINE W REFLEX MICROSCOPIC
Bilirubin Urine: NEGATIVE
Glucose, UA: NEGATIVE mg/dL
Hgb urine dipstick: NEGATIVE
Ketones, ur: NEGATIVE mg/dL
Leukocytes,Ua: NEGATIVE
Nitrite: NEGATIVE
Protein, ur: NEGATIVE mg/dL
Specific Gravity, Urine: 1.01 (ref 1.005–1.030)
pH: 7.5 (ref 5.0–8.0)

## 2021-09-10 LAB — HCG, QUANTITATIVE, PREGNANCY: hCG, Beta Chain, Quant, S: 127330 m[IU]/mL — ABNORMAL HIGH (ref <5)

## 2021-09-10 MED ORDER — ACETAMINOPHEN 500 MG PO TABS: 1000.0000 mg | ORAL_TABLET | Freq: Four times a day (QID) | ORAL | 0 refills | Status: DC | PRN

## 2021-09-10 MED ORDER — ACETAMINOPHEN 500 MG PO TABS: 1000.0000 mg | ORAL_TABLET | Freq: Four times a day (QID) | ORAL | Status: DC | PRN

## 2021-09-10 NOTE — Discharge Instructions (Signed)

## 2021-09-10 NOTE — Telephone Encounter (Signed)
Pt called c/o severe RLQ pain. States it started about 9 oclock last night. Pt is currently pregnant with LMP: 06/18/21. She denies any bleeding or unusual vaginal discharge. Pt states she is having some nausea but no vomiting. Pt does still have her appendix. She did not take any tylenol or use heat to help with the pain. Denies any fever. Pt has not had imaging with this pregnancy. Will send patient to [facility] for evaluation. Pt agrees and verbalized understanding.

## 2021-09-10 NOTE — [facility]U Note (Signed)
Kathryn Pena is a 20 y.o. at [redacted]w[redacted]d here in [facility] reporting: right lower abdominal pain since 2100 last night. Also feeling some nausea but no vomiting. No bleeding or discharge.  Onset of complaint: last night  Pain score: 7/10  Vitals:   09/10/21 1055  BP: 132/79  Pulse: 83  Resp: 16  Temp: 98.7 F (37.1 C)  SpO2: 99%     YHC:[id number] attempted  Lab orders placed from triage: UA

## 2021-09-10 NOTE — MAU Provider Note (Addendum)
History     CSN: [id number]  Arrival date and time: 09/10/21 1043   Event Date/Time   First Provider Initiated Contact with Patient 09/10/21 1203      Chief Complaint  Patient presents with   Abdominal Pain   Nausea   20 y.o. G3P1011 @12 .0 wks presenting with LAP. Describes as intermittent cramping on RLQ that started last night. Rates pain 7/10. Has not treated. Denies VB or discharge. Reports watery diarrhea daily for the last week. Denies sick contacts. Reports nausea but no vomiting. She is eating and drinking.    OB History     Gravida  3   Para  1   Term  1   Preterm      AB  1   Living  1      SAB      IAB  1   Ectopic      Multiple  0   Live Births  1           Past Medical History:  Diagnosis Date   Pregnancy induced hypertension     Past Surgical History:  Procedure Laterality Date   NO PAST SURGERIES      Family History  Problem Relation Age of Onset   Healthy Mother     Social History   Tobacco Use   Smoking status: Former    Types: Cigars    Quit date: 06/27/2018    Years since quitting: 3.2   Smokeless tobacco: Never  Vaping Use   Vaping Use: Never used  Substance Use Topics   Alcohol use: No   Drug use: No    Allergies: No Known Allergies  Medications Prior to Admission  Medication Sig Dispense Refill Last Dose   Doxylamine-Pyridoxine (DICLEGIS) 10-10 MG TBEC Take 2 tablets by mouth at bedtime. If symptoms persist, add one tablet in the morning and one in the afternoon 100 tablet 5    Prenatal Vit-Fe Phos-FA-Omega (VITAFOL GUMMIES) 3.33-0.333-34.8 MG CHEW Chew 1 tablet by mouth daily. 90 tablet 5    promethazine (PHENERGAN) 25 MG tablet Take 1 tablet (25 mg total) by mouth every 6 (six) hours as needed for nausea or vomiting. 30 tablet 1     Review of Systems  Constitutional:  Negative for chills and fever.  Gastrointestinal:  Positive for abdominal pain, diarrhea and nausea. Negative for constipation and  vomiting.  Genitourinary:  Negative for dysuria, frequency, urgency, vaginal bleeding and vaginal discharge.  Physical Exam   Blood pressure 122/70, pulse 85, temperature 98.7 F (37.1 C), temperature source Oral, resp. rate 16, height 5\' 8"  (1.727 m), weight 98.3 kg, last menstrual period 06/18/2021, SpO2 99 %.  Physical Exam Vitals and nursing note reviewed.  Constitutional:      General: She is not in acute distress.    Appearance: Normal appearance.  HENT:     Head: Normocephalic and atraumatic.  Pulmonary:     Effort: Pulmonary effort is normal. No respiratory distress.  Abdominal:     General: There is no distension.     Palpations: Abdomen is soft. There is no mass.     Tenderness: There is no abdominal tenderness. There is no guarding or rebound.     Hernia: No hernia is present.  Musculoskeletal:        General: Normal range of motion.     Cervical back: Normal range of motion.  Skin:    General: Skin is warm and dry.  Neurological:  General: No focal deficit present.     Mental Status: She is alert and oriented to person, place, and time.  Psychiatric:        Mood and Affect: Mood normal.        Behavior: Behavior normal.   Results for orders placed or performed during the hospital encounter of 09/10/21 (from the past 24 hour(s))  CBC     Status: None   Collection Time: 09/10/21 12:18 PM  Result Value Ref Range   WBC 5.2 4.0 - 10.5 K/uL   RBC 4.25 3.87 - 5.11 MIL/uL   Hemoglobin 12.8 12.0 - 15.0 g/dL   HCT 69.4 85.4 - 62.7 %   MCV 88.7 80.0 - 100.0 fL   MCH 30.1 26.0 - 34.0 pg   MCHC 34.0 30.0 - 36.0 g/dL   RDW 03.5 00.9 - 38.1 %   Platelets 255 150 - 400 K/uL   nRBC 0.0 0.0 - 0.2 %  Wet prep, genital     Status: None   Collection Time: 09/10/21 12:43 PM   Specimen: PATH Cytology Cervicovaginal Ancillary Only  Result Value Ref Range   Yeast Wet Prep HPF POC NONE SEEN NONE SEEN   Trich, Wet Prep NONE SEEN NONE SEEN   Clue Cells Wet Prep HPF POC NONE  SEEN NONE SEEN   WBC, Wet Prep HPF POC <10 <10   Sperm NONE SEEN   Urinalysis, Routine w reflex microscopic Urine, Clean Catch     Status: None   Collection Time: 09/10/21 12:56 PM  Result Value Ref Range   Color, Urine YELLOW YELLOW   APPearance CLEAR CLEAR   Specific Gravity, Urine 1.010 1.005 - 1.030   pH 7.5 5.0 - 8.0   Glucose, UA NEGATIVE NEGATIVE mg/dL   Hgb urine dipstick NEGATIVE NEGATIVE   Bilirubin Urine NEGATIVE NEGATIVE   Ketones, ur NEGATIVE NEGATIVE mg/dL   Protein, ur NEGATIVE NEGATIVE mg/dL   Nitrite NEGATIVE NEGATIVE   Leukocytes,Ua NEGATIVE NEGATIVE    US OB Comp Less 14 Wks  Result Date: 09/10/2021 CLINICAL DATA:  Right lower quadrant abdominal pain for 1 day. First trimester pregnancy. EXAM: OBSTETRIC <14 WK ULTRASOUND TECHNIQUE: Transabdominal ultrasound was performed for evaluation of the gestation as well as the maternal uterus and adnexal regions. COMPARISON:  None. FINDINGS: Intrauterine gestational sac: Present Yolk sac:  Present Embryo:  Present Cardiac Activity: Present Heart Rate: 176 bpm CRL:   21.6 mm   8 w 5 d                  [place] EDC: 04/17/2022 Subchorionic hemorrhage:  None visualized. Maternal uterus/adnexae: Unremarkable IMPRESSION: 1. Single living intrauterine pregnancy measuring at 8 weeks 5 days gestation. Electronically Signed   By: Gaylyn Rong M.D.   On: 09/10/2021 13:02    MAU Course  Procedures Tylenol  MDM Labs and [place] ordered and reviewed. Normal IUP @[redacted]w[redacted]d . Sx likely GI. Dicussed treatments. Stable for discharge home.   Assessment and Plan   1. [redacted] weeks gestation of pregnancy   2. Abdominal pain affecting pregnancy   3. Gastroenteritis    Discharge home Follow up at [facility] as scheduled Imodium OTC Return precautions  Allergies as of 09/10/2021   No Known Allergies      Medication List     TAKE these medications    acetaminophen 500 MG tablet Commonly known as: TYLENOL Take 2 tablets (1,000 mg total) by mouth  every 6 (six) hours as needed for moderate pain.  Doxylamine-Pyridoxine 10-10 MG Tbec Commonly known as: Diclegis Take 2 tablets by mouth at bedtime. If symptoms persist, add one tablet in the morning and one in the afternoon   promethazine 25 MG tablet Commonly known as: PHENERGAN Take 1 tablet (25 mg total) by mouth every 6 (six) hours as needed for nausea or vomiting.   Vitafol Gummies 3.33-0.333-34.8 MG Chew Chew 1 tablet by mouth daily.        Donette Larry, CNM 09/10/2021, 1:53 PM

## 2021-09-11 LAB — GC/CHLAMYDIA PROBE AMP (~~LOC~~) NOT AT ARMC
Chlamydia: NEGATIVE
Comment: NEGATIVE
Comment: NORMAL
Neisseria Gonorrhea: NEGATIVE

## 2021-09-26 ENCOUNTER — Ambulatory Visit: Payer: Medicaid Other | Admitting: *Deleted

## 2021-09-26 VITALS — BP 128/78 | HR 83

## 2021-09-26 DIAGNOSIS — Z3480 Encounter for supervision of other normal pregnancy, unspecified trimester: Secondary | ICD-10-CM

## 2021-09-26 MED ORDER — VITAFOL ULTRA 29-0.6-0.4-200 MG PO CAPS: 1.0000 | ORAL_CAPSULE | Freq: Every day | ORAL | 11 refills | Status: DC

## 2021-09-26 NOTE — Progress Notes (Signed)
Patient was assessed and managed by nursing staff during this encounter. I have reviewed the chart and agree with the documentation and plan. I have also made any necessary editorial changes. ° °Clotee Schlicker, MD °09/26/2021 10:52 AM  ° °

## 2021-09-26 NOTE — Progress Notes (Signed)
New OB Intake  I connected with  Kathryn Pena on 09/26/21 at  9:00 AM EST by in person visit.   I explained I am completing New OB Intake today. We discussed her EDD of 04/17/22 that is based on u/s on 09/10/21. Pt is G3/P1. I reviewed her allergies, medications, Medical/Surgical/OB history, and appropriate screenings. I informed her of [facility] services. Based on history, this is a/an  pregnancy uncomplicated .   Patient Active Problem List   Diagnosis Date Noted   Visit for routine gyn exam 08/05/2021   Possible exposure to STD 08/05/2021   Contraception management 08/05/2021   History of pre-eclampsia 06/26/2020    Concerns addressed today  Delivery Plans:  Plans to deliver at [facility].   MyChart/Babyscripts MyChart access verified. I explained pt will have some visits in office and some virtually. Pt has Babyscripts app from previous pregnancy. Will verify if she can access.   Blood Pressure Cuff  Pt has BP cuff at home. Explained after first prenatal appt pt will check weekly and document in Babyscripts.  Weight scale: Patient has a weight scale.   Anatomy US Explained first scheduled [place] will be around 19 weeks. Anatomy US ordered today.  Labs Discussed [name] genetic screening with patient. Would like both Panorama and Horizon drawn at new OB visit. Routine prenatal labs needed.  Covid Vaccine Patient has not covid vaccine.   Centering in Pregnancy Candidate?  If yes, offer as possibility - pt info sent to [facility] for review  Informed patient of [facility] website  and placed link in her AVS.   Social Determinants of Health Food Insecurity: Patient denies food insecurity. WIC Referral: Patient is not interested in referral to [facility].  Transportation: Patient denies transportation needs. Childcare: Discussed no children allowed at ultrasound appointments. Offered childcare services; patient declines childcare services at this time.  Send link to Pregnancy  Navigators   Placed OB Box on problem list and updated  First visit review I reviewed new OB appt with pt. I explained she will have a pelvic exam, ob bloodwork with genetic screening, and PAP smear. Explained pt will be seen by Nolene Bernheim, NP at first visit; encounter routed to appropriate provider. Explained that patient will be seen by pregnancy navigator following visit with provider. [facility] information placed in AVS.   Ozzie Hoyle, CMA 09/26/2021  8:58 AM

## 2021-10-13 NOTE — L&D Delivery Note (Signed)
Obstetrical Delivery Note   Date of Delivery:   04/13/2022 Primary OB:   [facility]  Gestational Age/EDD: [redacted]w[redacted]d Reason for Admission: IOL for gestational hypertension Antepartum complications: none  Delivered By:   Cornelia Copa. MD  Delivery Type:   vacuum, low  Delivery Details:   I called to patient's room due to deep decels with pushing. SVE with BPD to +2, direct OP, EFW 3400gm and pelvis felt adequate. I verbally consented mom for VAVD; foley catheter had just been removed and Peds team called. With next contraction, kiwi vacuum applied to flexion point and used per manufacturer's instructions and baby brought to +3 but then went back to +2 and kiwi suction released; this was the only time the kiwi was ever used. On next contraction, I had her push without vacuum and she pushed to the same, so I had her legs stay back in between contraction and this helped keep baby at +3. Her introitus felt tight so I d/w her re: recommendation for episotomy. With next contraction, head came to +4 and right medio-lateral episotomy cut but head went to +3. With the next contraction, the head wad delivered but had positive "turtle sign" so shoulder dystocia was called and even more dorsal lithotomy applied and mom was told to stop pushing.  no traction on the head and neck wad ever done. I went for the posterior arm but this was difficult so I went for the anterior arm which caused the baby to turn 180 degrees. I felt a snap and then that arm delivered, I had mom push again, and I delivered the rest of the baby by grasping its body. The cord was immediately clamped and cut and handed to the awaiting peds team. Pitocin and IV Lysteda given, as well as Ancef 2gm Anesthesia:    epidural Intrapartum complications: Non-reassuring Fetal Status just prior to delivery GBS:    Positive and she was adequately treated Laceration:    2nd degree. Repaired with 2-0 vicryl Episiotomy:     Right mediolateral without extension. Repaired with 2-0 vicryl Rectal exam:   Negative pre and post repair Placenta:    Delivered and expressed via active management. Intact: yes. To pathology: no.  Delayed Cord Clamping: no Estimated Blood Loss:   Baby:    Liveborn female, APGARs 1/4/7, weight 3660gm Arterial 7.14, co2 79, bicarb 26.9 Venous 7.27, co2 47, bicarb 21.6  Cornelia Copa. MD Attending Center for [facility])

## 2021-10-15 ENCOUNTER — Encounter: Payer: Self-pay | Admitting: Nurse Practitioner

## 2021-10-15 ENCOUNTER — Ambulatory Visit (INDEPENDENT_AMBULATORY_CARE_PROVIDER_SITE_OTHER): Payer: Medicaid Other | Admitting: Licensed Clinical Social Worker

## 2021-10-15 ENCOUNTER — Other Ambulatory Visit (HOSPITAL_COMMUNITY)
Admission: RE | Admit: 2021-10-15 | Discharge: 2021-10-15 | Disposition: A | Discharge status: Home / Self Care | Payer: Medicaid Other | Source: Ambulatory Visit | Attending: Nurse Practitioner | Admitting: Nurse Practitioner

## 2021-10-15 ENCOUNTER — Other Ambulatory Visit: Payer: Self-pay

## 2021-10-15 ENCOUNTER — Ambulatory Visit (INDEPENDENT_AMBULATORY_CARE_PROVIDER_SITE_OTHER): Payer: Medicaid Other | Admitting: Nurse Practitioner

## 2021-10-15 VITALS — BP 126/77 | HR 86 | Wt 218.0 lb

## 2021-10-15 DIAGNOSIS — Z3481 Encounter for supervision of other normal pregnancy, first trimester: Secondary | ICD-10-CM | POA: Diagnosis not present

## 2021-10-15 DIAGNOSIS — Z3A13 13 weeks gestation of pregnancy: Secondary | ICD-10-CM | POA: Diagnosis not present

## 2021-10-15 DIAGNOSIS — O09299 Supervision of pregnancy with other poor reproductive or obstetric history, unspecified trimester: Secondary | ICD-10-CM

## 2021-10-15 DIAGNOSIS — Z3480 Encounter for supervision of other normal pregnancy, unspecified trimester: Secondary | ICD-10-CM | POA: Diagnosis not present

## 2021-10-15 DIAGNOSIS — Z3490 Encounter for supervision of normal pregnancy, unspecified, unspecified trimester: Secondary | ICD-10-CM | POA: Diagnosis present

## 2021-10-15 DIAGNOSIS — F439 Reaction to severe stress, unspecified: Secondary | ICD-10-CM | POA: Diagnosis not present

## 2021-10-15 DIAGNOSIS — R110 Nausea: Secondary | ICD-10-CM

## 2021-10-15 DIAGNOSIS — B3731 Acute candidiasis of vulva and vagina: Secondary | ICD-10-CM | POA: Diagnosis not present

## 2021-10-15 DIAGNOSIS — B951 Streptococcus, group B, as the cause of diseases classified elsewhere: Secondary | ICD-10-CM

## 2021-10-15 DIAGNOSIS — N390 Urinary tract infection, site not specified: Secondary | ICD-10-CM

## 2021-10-15 LAB — OB RESULTS CONSOLE GBS: GBS: POSITIVE

## 2021-10-15 NOTE — Patient Instructions (Signed)
Your next appointment will be at the [facility] office.  Check the address on your visit summary.

## 2021-10-15 NOTE — Progress Notes (Addendum)
Subjective:   KESHA HURRELL is a 21 y.o. G3P1011 at [redacted]w[redacted]d by early ultrasound being seen today for her first obstetrical visit.  Her obstetrical history is significant for  pre-eclampsia in labor in previous pregnancy . Patient does intend to breast feed. Pregnancy history fully reviewed.  Patient reports no complaints.  HISTORY: OB History  Gravida Para Term Preterm AB Living  3 1 1  0 1 1  SAB IAB Ectopic Multiple Live Births  0 1 0 0 1    # Outcome Date GA Lbr Len/2nd Weight Sex Delivery Anes PTL Lv  3 Current           2 IAB 05/26/20 [redacted]w[redacted]d         1 Term 02/14/19 [redacted]w[redacted]d 27:36 / 03:34 6 lb 14.8 oz (3.141 kg) M Vag-Spont EPI, Local  LIV     Name: Manzi,BOY Elisandra     Apgar1: 6  Apgar5: 8   Past Medical History:  Diagnosis Date   Pregnancy induced hypertension    Past Surgical History:  Procedure Laterality Date   NO PAST SURGERIES     Family History  Problem Relation Age of Onset   Healthy Mother    Social History   Tobacco Use   Smoking status: Former    Types: Cigars    Quit date: 06/27/2018    Years since quitting: 3.3   Smokeless tobacco: Never  Vaping Use   Vaping Use: Never used  Substance Use Topics   Alcohol use: No   Drug use: No   No Known Allergies Current Outpatient Medications on File Prior to Visit  Medication Sig Dispense Refill   acetaminophen (TYLENOL) 500 MG tablet Take 2 tablets (1,000 mg total) by mouth every 6 (six) hours as needed for moderate pain. 30 tablet 0   Doxylamine-Pyridoxine (DICLEGIS) 10-10 MG TBEC Take 2 tablets by mouth at bedtime. If symptoms persist, add one tablet in the morning and one in the afternoon 100 tablet 5   Prenatal Vit-Fe Phos-FA-Omega (VITAFOL GUMMIES) 3.33-0.333-34.8 MG CHEW Chew 1 tablet by mouth daily. 90 tablet 5   promethazine (PHENERGAN) 25 MG tablet Take 1 tablet (25 mg total) by mouth every 6 (six) hours as needed for nausea or vomiting. 30 tablet 1   Prenat-Fe Poly-Methfol-FA-DHA (VITAFOL  ULTRA) 29-0.6-0.4-200 MG CAPS Take 1 capsule by mouth daily. (Patient not taking: Reported on 10/15/2021) 30 capsule 11   [DISCONTINUED] enalapril (VASOTEC) 5 MG tablet Take 1 tablet (5 mg total) by mouth daily. 30 tablet 0   No current facility-administered medications on file prior to visit.     Exam   Vitals:   10/15/21 1109  BP: 126/77  Pulse: 86  Weight: 218 lb (98.9 kg)   Fetal Heart Rate (bpm): 159  Uterus:     Pelvic Exam: Perineum: deferred   Vulva:    Vagina:     Cervix:    Adnexa:    Bony Pelvis:   System: General: well-developed, well-nourished female in no acute distress   Breast:  normal appearance, no masses or tenderness   Skin: normal coloration and turgor, no rashes   Neurologic: oriented, normal, negative, normal mood   Extremities: normal strength, tone, and muscle mass, ROM of all joints is normal   HEENT extraocular movement intact and sclera clear, anicteric   Mouth/Teeth deferred   Neck supple and no masses, normal thyroid   Cardiovascular: regular rate and rhythm   Respiratory:  no respiratory distress, normal breath  sounds   Abdomen: soft, non-tender; no masses,  no organomegaly     Assessment:   Pregnancy: G3P1011 Patient Active Problem List   Diagnosis Date Noted   Supervision of other normal pregnancy, antepartum 10/15/2021   History of pre-eclampsia 06/26/2020     Plan:  1. Supervision of other normal pregnancy, antepartum This is the only visit at this office.  Will join the Centering Pregnancy group at her next visit at [facility].  - CBC/D/Plt+RPR+Rh+ABO+RubIgG... - Culture, OB Urine - Genetic Screening - Cervicovaginal ancillary only - Interpretation:  2. Nausea Has not had vomiting. From time to time takes Diclegis.  3.  History of pre-eclampsia in previous pregnancy Had gestational hypertension and was scheduled for IOL.  Progressed to severe pre-eclampsia in labor and given MGSO4. Will need low dose ASA started at next  visit.   4. [redacted] weeks gestation of pregnancy   Initial labs drawn. Continue prenatal vitamins. Genetic Screening discussed, NIPS: ordered. Ultrasound discussed; fetal anatomic survey:  scheduled . Problem list reviewed and updated. The nature of  - [facility] with multiple MDs and other Advanced Practice Providers was explained to patient; also emphasized that residents, students are part of our team. Routine obstetric precautions reviewed. Return in 15 days (on 10/30/2021) for Centering Prenatal Care at [facility].  Total face-to-face time with patient: 40 minutes.  Over 50% of encounter was spent on counseling and coordination of care.     Nolene Bernheim, FNP Family Nurse Practitioner, [facility] for [employer], [facility] 10/15/21  11:39 am

## 2021-10-16 LAB — CBC/D/PLT+RPR+RH+ABO+RUBIGG...
Antibody Screen: NEGATIVE
Basophils Absolute: 0 10*3/uL (ref 0.0–0.2)
Basos: 0 %
EOS (ABSOLUTE): 0.1 10*3/uL (ref 0.0–0.4)
Eos: 1 %
HCV Ab: <0.1 s/co ratio (ref 0.0–0.9)
HIV Screen 4th Generation wRfx: NONREACTIVE
Hematocrit: 36.7 % (ref 34.0–46.6)
Hemoglobin: 13.1 g/dL (ref 11.1–15.9)
Hepatitis B Surface Ag: NEGATIVE
Immature Grans (Abs): 0 10*3/uL (ref 0.0–0.1)
Immature Granulocytes: 0 %
Lymphocytes Absolute: 1.5 10*3/uL (ref 0.7–3.1)
Lymphs: 24 %
MCH: 30.7 pg (ref 26.6–33.0)
MCHC: 35.7 g/dL (ref 31.5–35.7)
MCV: 86 fL (ref 79–97)
Monocytes Absolute: 0.4 10*3/uL (ref 0.1–0.9)
Monocytes: 6 %
Neutrophils Absolute: 4.4 10*3/uL (ref 1.4–7.0)
Neutrophils: 69 %
Platelets: 268 10*3/uL (ref 150–450)
RBC: 4.27 x10E6/uL (ref 3.77–5.28)
RDW: 13.2 % (ref 11.7–15.4)
RPR Ser Ql: NONREACTIVE
Rh Factor: POSITIVE
Rubella Antibodies, IGG: 8.28 index (ref >0.99)
WBC: 6.4 10*3/uL (ref 3.4–10.8)

## 2021-10-16 LAB — CERVICOVAGINAL ANCILLARY ONLY
Bacterial Vaginitis (gardnerella): NEGATIVE
Candida Glabrata: NEGATIVE
Candida Vaginitis: POSITIVE — AB
Chlamydia: NEGATIVE
Comment: NEGATIVE
Comment: NEGATIVE
Comment: NEGATIVE
Comment: NEGATIVE
Comment: NEGATIVE
Comment: NORMAL
Neisseria Gonorrhea: NEGATIVE
Trichomonas: NEGATIVE

## 2021-10-16 LAB — HCV INTERPRETATION

## 2021-10-17 MED ORDER — TERCONAZOLE 0.4 % VA CREA: 1.0000 | TOPICAL_CREAM | Freq: Every day | VAGINAL | 1 refills | Status: AC

## 2021-10-17 NOTE — Addendum Note (Signed)
Addended by: Currie Paris on: 10/17/2021 09:35 AM   Modules accepted: Orders

## 2021-10-18 NOTE — BH Specialist Note (Signed)
[place] Initial In-Person Visit  MRN: [id number] Name: Kathryn Pena  Number of [employer] Clinician visits:: 1/6 Session Start time: 11:15am  Session End time: 11:50am Total time: 35  minutes in person at [facility]   Types of Service: Individual psychotherapy  Interpretor:No. Interpretor Name and Language: none   Warm Hand Off Completed.        Subjective: Kathryn Pena is a 21 y.o. female accompanied by n/a Patient was referred by Butch Penny NP for social stressor. Patient reports the following symptoms/concerns: stress,  Duration of problem: approx one month ; Severity of problem: mild  Objective: Mood: good  and Affect: Appropriate Risk of harm to self or others: No plan to harm self or others  Life Context: Family and Social: Lives with family and son  School/Work: n/a Self-Care: n/a Life Changes: New pregnancy  Patient and/or Family's Strengths/Protective Factors: Concrete supports in place (healthy food, safe environments, etc.)  Goals Addressed: Patient will: Reduce symptoms of: stress Increase knowledge and/or ability of: stress reduction  Demonstrate ability to: Increase adequate support systems for patient/family  Progress towards Goals: Ongoing  Interventions: Interventions utilized: Supportive Counseling  Standardized Assessments completed: PHQ 9   Assessment: Patient currently experiencing situational stress.   Patient may benefit from integrated behavioral health.  Plan: Follow up with behavioral health clinician on : 3 weeks via mychart  Behavioral recommendations: prioritize rest, take prenatal vitamins, intentionally schedule time for self care to prevent burnout  Referral(s): [place] (In Clinic) "From scale of 1-10, how likely are you to follow plan?":   Lynnea Ferrier, LCSW

## 2021-10-19 LAB — URINE CULTURE, OB REFLEX

## 2021-10-19 LAB — CULTURE, OB URINE

## 2021-10-22 ENCOUNTER — Encounter: Payer: Self-pay | Admitting: Nurse Practitioner

## 2021-10-22 DIAGNOSIS — R8271 Bacteriuria: Secondary | ICD-10-CM | POA: Insufficient documentation

## 2021-10-22 MED ORDER — AMOXICILLIN 500 MG PO TABS: 500.0000 mg | ORAL_TABLET | Freq: Three times a day (TID) | ORAL | 0 refills | Status: AC

## 2021-10-22 NOTE — Addendum Note (Signed)
Addended by: Virginia Rochester on: 10/22/2021 07:16 PM   Modules accepted: Orders

## 2021-10-23 ENCOUNTER — Telehealth: Payer: Self-pay | Admitting: *Deleted

## 2021-10-23 NOTE — Telephone Encounter (Signed)
I called Kathryn Pena to welcome her to CenteringPregnancy and review some information. I heard a message " voicemail is full and cannot accept messages". Will send detailed Mychart message. Korbin Mapps,RN

## 2021-10-24 ENCOUNTER — Encounter: Payer: Self-pay | Admitting: Nurse Practitioner

## 2021-10-30 ENCOUNTER — Ambulatory Visit (INDEPENDENT_AMBULATORY_CARE_PROVIDER_SITE_OTHER): Payer: Medicaid Other | Admitting: Family Medicine

## 2021-10-30 ENCOUNTER — Other Ambulatory Visit: Payer: Self-pay

## 2021-10-30 VITALS — BP 115/73 | Wt 215.4 lb

## 2021-10-30 DIAGNOSIS — R8271 Bacteriuria: Secondary | ICD-10-CM | POA: Diagnosis not present

## 2021-10-30 DIAGNOSIS — Z8759 Personal history of other complications of pregnancy, childbirth and the puerperium: Secondary | ICD-10-CM | POA: Diagnosis not present

## 2021-10-30 DIAGNOSIS — Z3A16 16 weeks gestation of pregnancy: Secondary | ICD-10-CM | POA: Diagnosis not present

## 2021-10-30 DIAGNOSIS — Z3480 Encounter for supervision of other normal pregnancy, unspecified trimester: Secondary | ICD-10-CM | POA: Diagnosis not present

## 2021-11-01 ENCOUNTER — Encounter: Payer: Self-pay | Admitting: *Deleted

## 2021-11-04 ENCOUNTER — Encounter: Payer: Self-pay | Admitting: Family Medicine

## 2021-11-04 MED ORDER — ASPIRIN EC 81 MG PO TBEC: 81.0000 mg | DELAYED_RELEASE_TABLET | Freq: Every day | ORAL | 2 refills | Status: DC

## 2021-11-04 NOTE — Progress Notes (Signed)
° ° ° °  PRENATAL VISIT NOTE- Centering Pregnancy Cycle 1, Session # 1  Subjective:  Kathryn Pena is a 21 y.o. G3P1011 at [redacted]w[redacted]d being seen today for ongoing prenatal care through Centering Pregnancy.  She is currently monitored for the following issues for this high-risk pregnancy and has History of pre-eclampsia; Supervision of other normal pregnancy, antepartum; and Group B streptococcal bacteriuria on their problem list.  Patient reports no complaints.  Contractions: Not present. Vag. Bleeding: None.  Movement: Present. Denies leaking of fluid/ROM.   The following portions of the patient's history were reviewed and updated as appropriate: allergies, current medications, past family history, past medical history, past social history, past surgical history and problem list. Problem list updated.  Objective:   Vitals:   10/30/21 1737  BP: 115/73  Weight: 215 lb 6.4 oz (97.7 kg)    Fetal Status: Fetal Heart Rate (bpm): 158   Movement: Present     General:  Alert, oriented and cooperative. Patient is in no acute distress.  Skin: Skin is warm and dry. No rash noted.   Cardiovascular: Normal heart rate noted  Respiratory: Normal respiratory effort, no problems with respiration noted  Abdomen: Soft, gravid, appropriate for gestational age.  Pain/Pressure: Absent     Pelvic: Cervical exam deferred        Extremities: Normal range of motion.  Edema: None  Mental Status: Normal mood and affect. Normal behavior. Normal judgment and thought content.   Assessment and Plan:  Pregnancy: G3P1011 at [redacted]w[redacted]d  1. History of pre-eclampsia Was not discussed at initial prenatal-- noted after Centering session and message sent/ASA sent. Added ASA to medication list and sent in medication   2. Supervision of other normal pregnancy, antepartum  Centering Pregnancy, Session#1: Introduction to model of care. Group determined rules for self-governance and closing phrase. Oriented group to space and  mother's notebook.   Facilitated discussion today:  common discomforts, When to call practice  Mindfulness activity completed as well as introduction to deep breathing for childbirth preparation- Centering 3 Breaths  Fundal height and FHR appropriate today unless noted otherwise in plan. Patient to continue group care.    3. Group B streptococcal bacteriuria Completed abx    Preterm labor symptoms and general obstetric precautions including but not limited to vaginal bleeding, contractions, leaking of fluid and fetal movement were reviewed in detail with the patient. Please refer to After Visit Summary for other counseling recommendations.  Return in about 4 weeks (around 11/27/2021) for Centering.  Future Appointments  Date Time Provider [place]  11/21/2021 10:30 AM [facility] NURSE [facility]  11/21/2021 10:45 AM [facility] US5 [facility]  11/27/2021  2:00 PM CENTERING PROVIDER [facility]  12/25/2021  2:00 PM CENTERING PROVIDER [facility]  01/22/2022  2:00 PM CENTERING PROVIDER [facility]  02/05/2022  2:00 PM CENTERING PROVIDER [facility]  02/19/2022  2:00 PM CENTERING PROVIDER [facility]  03/05/2022  2:00 PM CENTERING PROVIDER [facility]  03/19/2022  2:00 PM CENTERING PROVIDER [facility]  04/02/2022  2:00 PM CENTERING PROVIDER [facility]  04/16/2022  2:00 PM CENTERING PROVIDER [facility]    Caren Macadam, MD

## 2021-11-12 ENCOUNTER — Encounter: Payer: Self-pay | Admitting: Family Medicine

## 2021-11-12 ENCOUNTER — Other Ambulatory Visit: Payer: Self-pay

## 2021-11-12 DIAGNOSIS — B3731 Acute candidiasis of vulva and vagina: Secondary | ICD-10-CM

## 2021-11-12 MED ORDER — TERCONAZOLE 0.4 % VA CREA: 1.0000 | TOPICAL_CREAM | Freq: Every day | VAGINAL | 0 refills | Status: DC

## 2021-11-13 ENCOUNTER — Other Ambulatory Visit: Payer: Self-pay | Admitting: Family Medicine

## 2021-11-13 DIAGNOSIS — Z3480 Encounter for supervision of other normal pregnancy, unspecified trimester: Secondary | ICD-10-CM

## 2021-11-21 ENCOUNTER — Ambulatory Visit: Payer: Medicaid Other

## 2021-11-21 ENCOUNTER — Other Ambulatory Visit: Payer: Self-pay

## 2021-11-25 ENCOUNTER — Encounter: Payer: Self-pay | Admitting: *Deleted

## 2021-11-27 ENCOUNTER — Encounter: Payer: Medicaid Other | Admitting: Family Medicine

## 2021-11-27 ENCOUNTER — Other Ambulatory Visit: Payer: Self-pay

## 2021-11-27 ENCOUNTER — Ambulatory Visit: Payer: Medicaid Other | Attending: Obstetrics and Gynecology

## 2021-11-27 ENCOUNTER — Ambulatory Visit: Payer: Medicaid Other | Admitting: *Deleted

## 2021-11-27 VITALS — BP 123/70 | HR 91

## 2021-11-27 DIAGNOSIS — O09292 Supervision of pregnancy with other poor reproductive or obstetric history, second trimester: Secondary | ICD-10-CM | POA: Insufficient documentation

## 2021-11-27 DIAGNOSIS — Z3689 Encounter for other specified antenatal screening: Secondary | ICD-10-CM | POA: Diagnosis not present

## 2021-11-27 DIAGNOSIS — Z363 Encounter for antenatal screening for malformations: Secondary | ICD-10-CM | POA: Diagnosis not present

## 2021-11-27 DIAGNOSIS — O321XX0 Maternal care for breech presentation, not applicable or unspecified: Secondary | ICD-10-CM | POA: Insufficient documentation

## 2021-11-27 DIAGNOSIS — Z3A19 19 weeks gestation of pregnancy: Secondary | ICD-10-CM | POA: Insufficient documentation

## 2021-11-27 DIAGNOSIS — Z3480 Encounter for supervision of other normal pregnancy, unspecified trimester: Secondary | ICD-10-CM

## 2021-11-28 ENCOUNTER — Other Ambulatory Visit: Payer: Self-pay | Admitting: *Deleted

## 2021-11-28 ENCOUNTER — Telehealth: Payer: Self-pay | Admitting: *Deleted

## 2021-11-28 DIAGNOSIS — O09299 Supervision of pregnancy with other poor reproductive or obstetric history, unspecified trimester: Secondary | ICD-10-CM

## 2021-11-28 DIAGNOSIS — Z362 Encounter for other antenatal screening follow-up: Secondary | ICD-10-CM

## 2021-11-28 NOTE — Telephone Encounter (Signed)
I called Kathryn Pena because she [facility] CenteringPregnancy appointment yesterday . I reached her voicemail and will send her a detailed MyChart message to schedule one on one with provider , and schedule afp. Braelen Sproule,RN

## 2021-12-02 ENCOUNTER — Telehealth: Payer: Self-pay | Admitting: Family Medicine

## 2021-12-02 NOTE — Telephone Encounter (Signed)
Called patient in regards to scheduling an appointment, there was no answer to the phone call so a voicemail was left with the call back number for the office.

## 2021-12-09 ENCOUNTER — Other Ambulatory Visit: Payer: Self-pay

## 2021-12-09 ENCOUNTER — Ambulatory Visit (INDEPENDENT_AMBULATORY_CARE_PROVIDER_SITE_OTHER): Payer: Medicaid Other | Admitting: Nurse Practitioner

## 2021-12-09 VITALS — BP 117/78 | HR 92 | Wt 230.0 lb

## 2021-12-09 DIAGNOSIS — Z3A21 21 weeks gestation of pregnancy: Secondary | ICD-10-CM

## 2021-12-09 DIAGNOSIS — R519 Headache, unspecified: Secondary | ICD-10-CM

## 2021-12-09 DIAGNOSIS — Z3480 Encounter for supervision of other normal pregnancy, unspecified trimester: Secondary | ICD-10-CM

## 2021-12-09 NOTE — Progress Notes (Signed)
° ° °  Subjective:  Kathryn Pena is a 21 y.o. G3P1011 at [redacted]w[redacted]d being seen today for ongoing prenatal care.  She is currently monitored for the following issues for this low-risk pregnancy and has History of pre-eclampsia; Supervision of other normal pregnancy, antepartum; and Group B streptococcal bacteriuria on their problem list.  Patient reports headache.  Contractions: Not present. Vag. Bleeding: None.  Movement: Present. Denies leaking of fluid.   The following portions of the patient's history were reviewed and updated as appropriate: allergies, current medications, past family history, past medical history, past social history, past surgical history and problem list. Problem list updated.  Objective:   Vitals:   12/09/21 1505  BP: 117/78  Pulse: 92  Weight: 230 lb (104.3 kg)    Fetal Status: Fetal Heart Rate (bpm): 138 Fundal Height: 25 cm Movement: Present     General:  Alert, oriented and cooperative. Patient is in no acute distress.  Skin: Skin is warm and dry. No rash noted.   Cardiovascular: Normal heart rate noted  Respiratory: Normal respiratory effort, no problems with respiration noted  Abdomen: Soft, gravid, appropriate for gestational age. Pain/Pressure: Absent     Pelvic:  Cervical exam deferred        Extremities: Normal range of motion.  Edema: None  Mental Status: Normal mood and affect. Normal behavior. Normal judgment and thought content.   Urinalysis:      Assessment and Plan:  Pregnancy: G3P1011 at [redacted]w[redacted]d  1. Supervision of other normal pregnancy, antepartum Feeling good. Taking low dose aspirin - for history of preeclampsia Normal BP today  - AFP, Serum, Open Spina Bifida  2. Frequent headaches Having headaches for about 25 minutes 3 times a week.  No visual changes.  Responds quickly to Tylenol that she takes.  Unsure of reason for her headaches.  Reviewed appropriate nutrition and fluid intake  3. [redacted] weeks gestation of pregnancy   Preterm  labor symptoms and general obstetric precautions including but not limited to vaginal bleeding, contractions, leaking of fluid and fetal movement were reviewed in detail with the patient. Please refer to After Visit Summary for other counseling recommendations.  Return for has next appointment scheduled for Centering on 12-25-21.  Nolene Bernheim, RN, MSN, NP-BC Nurse Practitioner, [facility] [employer], [facility] 12/09/2021 3:26 PM

## 2021-12-09 NOTE — Progress Notes (Signed)
Patient stated that she gets "bad headaches sometimes" roughly 3x a week.

## 2021-12-11 LAB — AFP, SERUM, OPEN SPINA BIFIDA
AFP MoM: 1.8
AFP Value: 109 ng/mL
Gest. Age on Collection Date: 21.4 weeks
Maternal Age At EDD: 22.4 yr
OSBR Risk 1 IN: 2555
Test Results:: NEGATIVE
Weight: 230 [lb_av]

## 2021-12-23 ENCOUNTER — Other Ambulatory Visit: Payer: Self-pay

## 2021-12-23 ENCOUNTER — Encounter: Payer: Self-pay | Admitting: *Deleted

## 2021-12-23 ENCOUNTER — Ambulatory Visit: Payer: Medicaid Other | Admitting: *Deleted

## 2021-12-23 ENCOUNTER — Ambulatory Visit: Payer: Medicaid Other | Attending: Obstetrics and Gynecology

## 2021-12-23 VITALS — BP 137/77 | HR 89

## 2021-12-23 DIAGNOSIS — Z362 Encounter for other antenatal screening follow-up: Secondary | ICD-10-CM

## 2021-12-23 DIAGNOSIS — O09292 Supervision of pregnancy with other poor reproductive or obstetric history, second trimester: Secondary | ICD-10-CM

## 2021-12-23 DIAGNOSIS — Z3A23 23 weeks gestation of pregnancy: Secondary | ICD-10-CM

## 2021-12-23 DIAGNOSIS — O09299 Supervision of pregnancy with other poor reproductive or obstetric history, unspecified trimester: Secondary | ICD-10-CM | POA: Diagnosis present

## 2021-12-24 ENCOUNTER — Other Ambulatory Visit: Payer: Self-pay | Admitting: *Deleted

## 2021-12-24 DIAGNOSIS — O09292 Supervision of pregnancy with other poor reproductive or obstetric history, second trimester: Secondary | ICD-10-CM

## 2021-12-25 ENCOUNTER — Other Ambulatory Visit: Payer: Self-pay

## 2021-12-25 ENCOUNTER — Ambulatory Visit (INDEPENDENT_AMBULATORY_CARE_PROVIDER_SITE_OTHER): Payer: Medicaid Other | Admitting: Family Medicine

## 2021-12-25 VITALS — BP 125/82 | HR 111 | Wt 236.2 lb

## 2021-12-25 DIAGNOSIS — Z3480 Encounter for supervision of other normal pregnancy, unspecified trimester: Secondary | ICD-10-CM | POA: Diagnosis not present

## 2021-12-25 DIAGNOSIS — Z3A23 23 weeks gestation of pregnancy: Secondary | ICD-10-CM | POA: Diagnosis not present

## 2021-12-25 DIAGNOSIS — Z8759 Personal history of other complications of pregnancy, childbirth and the puerperium: Secondary | ICD-10-CM

## 2021-12-25 DIAGNOSIS — R8271 Bacteriuria: Secondary | ICD-10-CM

## 2021-12-27 NOTE — Progress Notes (Signed)
? ? ? ?  PRENATAL VISIT NOTE ? ?Subjective:  ?Kathryn Pena is a 21 y.o. G3P1011 at [redacted]w[redacted]d being seen today for ongoing prenatal care.  She is currently monitored for the following issues for this low-risk pregnancy and has History of pre-eclampsia; Supervision of other normal pregnancy, antepartum; and Group B streptococcal bacteriuria on their problem list. ? ?Patient reports no complaints.  Contractions: Not present. Vag. Bleeding: None.  Movement: Present. Denies leaking of fluid.  ? ?The following portions of the patient's history were reviewed and updated as appropriate: allergies, current medications, past family history, past medical history, past social history, past surgical history and problem list.  ? ?Objective:  ? ?Vitals:  ? 12/25/21 1709  ?BP: 125/82  ?Pulse: (!) 111  ?Weight: 236 lb 3.2 oz (107.1 kg)  ? ? ?Fetal Status: Fetal Heart Rate (bpm): 135 Fundal Height: 24 cm Movement: Present    ? ?General:  Alert, oriented and cooperative. Patient is in no acute distress.  ?Skin: Skin is warm and dry. No rash noted.   ?Cardiovascular: Normal heart rate noted  ?Respiratory: Normal respiratory effort, no problems with respiration noted  ?Abdomen: Soft, gravid, appropriate for gestational age.  Pain/Pressure: Absent     ?Pelvic: Cervical exam deferred        ?Extremities: Normal range of motion.  Edema: None  ?Mental Status: Normal mood and affect. Normal behavior. Normal judgment and thought content.  ? ?Assessment and Plan:  ?Pregnancy: G3P1011 at [redacted]w[redacted]d ? ?1. History of pre-eclampsia ?On ASA, BP is WNL ? ?2. Supervision of other normal pregnancy, antepartum ? ?TWG=18 lb 3.2 oz (8.255 kg) which is above goal for her baseline BMI.  ? ?Centering Pregnancy, Session#3: Reviewed resources in [employer].  ? ?Facilitated discussion today:  Breastfeeding/infant nutrition ?Mindfulness activity completed as well as deep breathing with still touch for childbirth preparation.   ? ?Fundal height and FHR  appropriate today unless noted otherwise in plan. Patient to continue group care.  ? ? ?3. Group B streptococcal bacteriuria ?PCN in labor ? ?Preterm labor symptoms and general obstetric precautions including but not limited to vaginal bleeding, contractions, leaking of fluid and fetal movement were reviewed in detail with the patient. ?Please refer to After Visit Summary for other counseling recommendations.  ? ?Return in about 4 weeks (around 01/22/2022) for Centering. ? ?Future Appointments  ?Date Time Provider Department Center  ?01/22/2022  2:00 PM CENTERING PROVIDER [facility]  ?01/27/2022  8:30 AM [facility] NURSE [facility]  ?01/27/2022  8:45 AM [facility] US4 [facility]  ?02/05/2022  2:00 PM CENTERING PROVIDER [facility]  ?02/19/2022  2:00 PM CENTERING PROVIDER [facility]  ?03/05/2022  2:00 PM CENTERING PROVIDER [facility]  ?03/19/2022  2:00 PM CENTERING PROVIDER [facility]  ?04/02/2022  2:00 PM CENTERING PROVIDER [facility]  ?04/16/2022  2:00 PM CENTERING PROVIDER [facility]  ? ? ?Federico Flake, MD ?

## 2022-01-08 ENCOUNTER — Encounter: Payer: Self-pay | Admitting: Family Medicine

## 2022-01-22 ENCOUNTER — Ambulatory Visit (INDEPENDENT_AMBULATORY_CARE_PROVIDER_SITE_OTHER): Payer: Medicaid Other | Admitting: Family Medicine

## 2022-01-22 VITALS — BP 122/81 | HR 111 | Wt 244.5 lb

## 2022-01-22 DIAGNOSIS — Z8759 Personal history of other complications of pregnancy, childbirth and the puerperium: Secondary | ICD-10-CM

## 2022-01-22 DIAGNOSIS — R8271 Bacteriuria: Secondary | ICD-10-CM

## 2022-01-22 DIAGNOSIS — Z3480 Encounter for supervision of other normal pregnancy, unspecified trimester: Secondary | ICD-10-CM

## 2022-01-22 DIAGNOSIS — Z3A27 27 weeks gestation of pregnancy: Secondary | ICD-10-CM | POA: Diagnosis not present

## 2022-01-22 MED ORDER — COMFORT FIT MATERNITY SUPP MED MISC: 1.0000 [IU] | Freq: Once | 1 refills | Status: AC

## 2022-01-22 NOTE — Progress Notes (Signed)
? ? ? ?  PRENATAL VISIT NOTE ? ?Subjective:  ?Kathryn Pena is a 21 y.o. G3P1011 at [redacted]w[redacted]d being seen today for ongoing prenatal care.  She is currently monitored for the following issues for this high-risk pregnancy and has History of pre-eclampsia; Supervision of other normal pregnancy, antepartum; and Group B streptococcal bacteriuria on their problem list. ? ?Patient reports no complaints.  Contractions: Not present. Vag. Bleeding: None.  Movement: Present. Denies leaking of fluid.  ? ?The following portions of the patient's history were reviewed and updated as appropriate: allergies, current medications, past family history, past medical history, past social history, past surgical history and problem list.  ? ?Objective:  ? ?Vitals:  ? 01/22/22 1754  ?BP: 122/81  ?Pulse: (!) 111  ?Weight: 244 lb 8 oz (110.9 kg)  ? ? ?Fetal Status: Fetal Heart Rate (bpm): 151 Fundal Height: 28 cm Movement: Present    ? ?General:  Alert, oriented and cooperative. Patient is in no acute distress.  ?Skin: Skin is warm and dry. No rash noted.   ?Cardiovascular: Normal heart rate noted  ?Respiratory: Normal respiratory effort, no problems with respiration noted  ?Abdomen: Soft, gravid, appropriate for gestational age.  Pain/Pressure: Absent     ?Pelvic: Cervical exam deferred        ?Extremities: Normal range of motion.  Edema: None  ?Mental Status: Normal mood and affect. Normal behavior. Normal judgment and thought content.  ? ?Assessment and Plan:  ?Pregnancy: G3P1011 at [redacted]w[redacted]d ?1. History of pre-eclampsia ?Taking ASA ? ?2. Supervision of other normal pregnancy, antepartum ? ?Centering Pregnancy, Session#4: Reviewed resources in [employer].  ? ?Facilitated discussion today:  Family dynamics/environment, family planning postpartum, and preterm labor ?Mindfulness activity completed as well as deep breathing with hand massage for childbirth preparation.   ? ?Fundal height and FHR appropriate today unless noted otherwise in  plan. Patient to continue group care.  ? ?Scheduled for  ? ?3. Group B streptococcal bacteriuria ?PCN in labor ? ?Preterm labor symptoms and general obstetric precautions including but not limited to vaginal bleeding, contractions, leaking of fluid and fetal movement were reviewed in detail with the patient. ?Please refer to After Visit Summary for other counseling recommendations.  ? ?Return in about 2 weeks (around 02/05/2022) for Centering Pregnancy. ? ?Future Appointments  ?Date Time Provider Reading  ?01/27/2022  8:30 AM [facility] NURSE [facility]  ?01/27/2022  8:45 AM [facility] US4 [facility]  ?02/05/2022  2:00 PM CENTERING PROVIDER [facility]  ?02/19/2022  2:00 PM CENTERING PROVIDER [facility]  ?03/05/2022  2:00 PM CENTERING PROVIDER [facility]  ?03/19/2022  2:00 PM CENTERING PROVIDER [facility]  ?04/02/2022  2:00 PM CENTERING PROVIDER [facility]  ?04/16/2022  2:00 PM CENTERING PROVIDER [facility]  ? ? ?Caren Macadam, MD ? ?

## 2022-01-24 ENCOUNTER — Other Ambulatory Visit: Payer: Medicaid Other

## 2022-01-24 DIAGNOSIS — Z3480 Encounter for supervision of other normal pregnancy, unspecified trimester: Secondary | ICD-10-CM

## 2022-01-25 LAB — CBC
Hematocrit: 30.3 % — ABNORMAL LOW (ref 34.0–46.6)
Hemoglobin: 10.1 g/dL — ABNORMAL LOW (ref 11.1–15.9)
MCH: 28 pg (ref 26.6–33.0)
MCHC: 33.3 g/dL (ref 31.5–35.7)
MCV: 84 fL (ref 79–97)
Platelets: 265 10*3/uL (ref 150–450)
RBC: 3.61 x10E6/uL — ABNORMAL LOW (ref 3.77–5.28)
RDW: 12.1 % (ref 11.7–15.4)
WBC: 7.1 10*3/uL (ref 3.4–10.8)

## 2022-01-25 LAB — GLUCOSE TOLERANCE, 2 HOURS W/ 1HR
Glucose, 1 hour: 121 mg/dL (ref 70–179)
Glucose, 2 hour: 98 mg/dL (ref 70–152)
Glucose, Fasting: 77 mg/dL (ref 70–91)

## 2022-01-25 LAB — HIV ANTIBODY (ROUTINE TESTING W REFLEX): HIV Screen 4th Generation wRfx: NONREACTIVE

## 2022-01-25 LAB — RPR: RPR Ser Ql: NONREACTIVE

## 2022-01-27 ENCOUNTER — Ambulatory Visit: Payer: Medicaid Other | Attending: Obstetrics

## 2022-01-27 ENCOUNTER — Other Ambulatory Visit: Payer: Self-pay | Admitting: *Deleted

## 2022-01-27 ENCOUNTER — Other Ambulatory Visit: Payer: Self-pay | Admitting: Family Medicine

## 2022-01-27 ENCOUNTER — Ambulatory Visit: Payer: Medicaid Other | Admitting: *Deleted

## 2022-01-27 ENCOUNTER — Encounter: Payer: Self-pay | Admitting: *Deleted

## 2022-01-27 VITALS — BP 125/72 | HR 89

## 2022-01-27 DIAGNOSIS — O99019 Anemia complicating pregnancy, unspecified trimester: Secondary | ICD-10-CM

## 2022-01-27 DIAGNOSIS — Z362 Encounter for other antenatal screening follow-up: Secondary | ICD-10-CM

## 2022-01-27 DIAGNOSIS — O09293 Supervision of pregnancy with other poor reproductive or obstetric history, third trimester: Secondary | ICD-10-CM

## 2022-01-27 DIAGNOSIS — Z3689 Encounter for other specified antenatal screening: Secondary | ICD-10-CM | POA: Insufficient documentation

## 2022-01-27 DIAGNOSIS — O09292 Supervision of pregnancy with other poor reproductive or obstetric history, second trimester: Secondary | ICD-10-CM | POA: Diagnosis present

## 2022-01-27 DIAGNOSIS — Z3A28 28 weeks gestation of pregnancy: Secondary | ICD-10-CM

## 2022-01-27 DIAGNOSIS — Z6833 Body mass index (BMI) 33.0-33.9, adult: Secondary | ICD-10-CM

## 2022-01-27 MED ORDER — FERROUS SULFATE 325 (65 FE) MG PO TABS: 325.0000 mg | ORAL_TABLET | ORAL | 1 refills | Status: DC

## 2022-02-05 ENCOUNTER — Ambulatory Visit (INDEPENDENT_AMBULATORY_CARE_PROVIDER_SITE_OTHER): Payer: Medicaid Other | Admitting: Family Medicine

## 2022-02-05 VITALS — BP 113/77 | HR 106 | Wt 244.4 lb

## 2022-02-05 DIAGNOSIS — Z8759 Personal history of other complications of pregnancy, childbirth and the puerperium: Secondary | ICD-10-CM

## 2022-02-05 DIAGNOSIS — Z3480 Encounter for supervision of other normal pregnancy, unspecified trimester: Secondary | ICD-10-CM | POA: Diagnosis not present

## 2022-02-05 DIAGNOSIS — R8271 Bacteriuria: Secondary | ICD-10-CM

## 2022-02-05 DIAGNOSIS — Z3A29 29 weeks gestation of pregnancy: Secondary | ICD-10-CM | POA: Diagnosis not present

## 2022-02-05 NOTE — Patient Instructions (Signed)

## 2022-02-05 NOTE — Progress Notes (Signed)
? ? ? ?  PRENATAL VISIT NOTE ? ?Subjective:  ?Kathryn Pena is a 21 y.o. G3P1011 at [redacted]w[redacted]d being seen today for ongoing prenatal care.  She is currently monitored for the following issues for this high-risk pregnancy and has History of pre-eclampsia; Supervision of other normal pregnancy, antepartum; and Group B streptococcal bacteriuria on their problem list. ? ?Patient reports no complaints.  Contractions: Not present. Vag. Bleeding: None.  Movement: Present. Denies leaking of fluid.  ? ?The following portions of the patient's history were reviewed and updated as appropriate: allergies, current medications, past family history, past medical history, past social history, past surgical history and problem list.  ? ?Objective:  ? ?Vitals:  ? 02/05/22 1700 02/05/22 1701  ?BP: (!) 129/94 113/77  ?Pulse: (!) 106   ?Weight: 244 lb 6.4 oz (110.9 kg)   ? ? ?Fetal Status: Fetal Heart Rate (bpm): 145 Fundal Height: 28 cm Movement: Present    ? ?General:  Alert, oriented and cooperative. Patient is in no acute distress.  ?Skin: Skin is warm and dry. No rash noted.   ?Cardiovascular: Normal heart rate noted  ?Respiratory: Normal respiratory effort, no problems with respiration noted  ?Abdomen: Soft, gravid, appropriate for gestational age.  Pain/Pressure: Absent     ?Pelvic: Cervical exam deferred        ?Extremities: Normal range of motion.  Edema: None  ?Mental Status: Normal mood and affect. Normal behavior. Normal judgment and thought content.  ? ?Assessment and Plan:  ?Pregnancy: G3P1011 at [redacted]w[redacted]d ?1. History of pre-eclampsia ?Taking ASA ?Initial BP was elevated but then repeat was well WNL ? ?2. Supervision of other normal pregnancy, antepartum ? ?Centering Pregnancy, Session#5: Reviewed resources in [employer]. Given Centering water bottle. Made rice socks ? ?Facilitated discussion today:  Stages of Labor, Sign of labor, labor positions, coping strategies.  ?Reviewed deep relaxation breathing and use of pool  noodle on low back for intrapartum massage.  ? ?Fundal height and FHR appropriate today unless noted otherwise in plan. Patient to continue group care.  ? ? ?3. Group B streptococcal bacteriuria ?PCN in ablor ? ?Preterm labor symptoms and general obstetric precautions including but not limited to vaginal bleeding, contractions, leaking of fluid and fetal movement were reviewed in detail with the patient. ?Please refer to After Visit Summary for other counseling recommendations.  ? ?Return in about 2 weeks (around 02/19/2022) for Centering Pregnancy. ? ?Future Appointments  ?Date Time Provider [place]  ?02/19/2022  2:00 PM CENTERING PROVIDER [facility]  ?03/05/2022  2:00 PM CENTERING PROVIDER [facility]  ?03/12/2022 10:45 AM [facility] NURSE [facility]  ?03/12/2022 11:00 AM [facility] US1 [facility]-M[facility] [place]  ?03/19/2022  2:00 PM CENTERING PROVIDER [facility]  ?04/02/2022  2:00 PM CENTERING PROVIDER [facility]  ?04/16/2022  2:00 PM CENTERING PROVIDER [facility]  ? ? ?Caren Macadam, MD ?

## 2022-02-07 ENCOUNTER — Encounter: Payer: Self-pay | Admitting: *Deleted

## 2022-02-11 ENCOUNTER — Encounter: Payer: Self-pay | Admitting: Family Medicine

## 2022-02-12 ENCOUNTER — Institutional Professional Consult (permissible substitution): Payer: Self-pay

## 2022-02-19 ENCOUNTER — Ambulatory Visit (INDEPENDENT_AMBULATORY_CARE_PROVIDER_SITE_OTHER): Payer: Medicaid Other | Admitting: Family Medicine

## 2022-02-19 VITALS — BP 129/74 | HR 91 | Wt 243.8 lb

## 2022-02-19 DIAGNOSIS — Z3480 Encounter for supervision of other normal pregnancy, unspecified trimester: Secondary | ICD-10-CM | POA: Diagnosis not present

## 2022-02-19 DIAGNOSIS — Z3A31 31 weeks gestation of pregnancy: Secondary | ICD-10-CM | POA: Diagnosis not present

## 2022-02-19 DIAGNOSIS — R8271 Bacteriuria: Secondary | ICD-10-CM

## 2022-02-19 DIAGNOSIS — Z8759 Personal history of other complications of pregnancy, childbirth and the puerperium: Secondary | ICD-10-CM

## 2022-02-19 NOTE — Progress Notes (Signed)
     PRENATAL VISIT NOTE  Subjective:  Kathryn Pena is a 21 y.o. G3P1011 at [redacted]w[redacted]d being seen today for ongoing prenatal care.  She is currently monitored for the following issues for this low-risk pregnancy and has History of pre-eclampsia; Supervision of other normal pregnancy, antepartum; and Group B streptococcal bacteriuria on their problem list.  Patient reports no complaints.  Contractions: Not present. Vag. Bleeding: None.  Movement: Present. Denies leaking of fluid.   The following portions of the patient's history were reviewed and updated as appropriate: allergies, current medications, past family history, past medical history, past social history, past surgical history and problem list.   Objective:   Vitals:   02/19/22 1421  BP: 129/74  Pulse: 91  Weight: 243 lb 12.8 oz (110.6 kg)    Fetal Status: Fetal Heart Rate (bpm): 140 Fundal Height: 31 cm Movement: Present     General:  Alert, oriented and cooperative. Patient is in no acute distress.  Skin: Skin is warm and dry. No rash noted.   Cardiovascular: Normal heart rate noted  Respiratory: Normal respiratory effort, no problems with respiration noted  Abdomen: Soft, gravid, appropriate for gestational age.  Pain/Pressure: Absent     Pelvic: Cervical exam deferred        Extremities: Normal range of motion.  Edema: None  Mental Status: Normal mood and affect. Normal behavior. Normal judgment and thought content.   Assessment and Plan:  Pregnancy: G3P1011 at [redacted]w[redacted]d  1. Group B streptococcal bacteriuria PCN in labor  2. Supervision of other normal pregnancy, antepartum  Centering Pregnancy, Session#6: Reviewed resources in [employer].  Facilitated discussion today:  Care of mother immediately after delivery, skin to skin for breastfeeding initiation, safe sleep Mindfulness activity -ice holding with deep breathing  Fundal height and FHR appropriate today unless noted otherwise in plan. Patient to  continue group care.   3. History of pre-eclampsia On ASA BP WNL   Preterm labor symptoms and general obstetric precautions including but not limited to vaginal bleeding, contractions, leaking of fluid and fetal movement were reviewed in detail with the patient. Please refer to After Visit Summary for other counseling recommendations.   Return in about 2 weeks (around 03/05/2022) for Centering Pregnancy.  Future Appointments  Date Time Provider [place]  03/12/2022 10:45 AM [facility] NURSE [facility]  03/12/2022 11:00 AM [facility] US1 [facility]  03/19/2022  2:00 PM CENTERING PROVIDER [facility]  04/02/2022  2:00 PM CENTERING PROVIDER [facility]  04/16/2022  2:00 PM CENTERING PROVIDER [facility]    Caren Macadam, MD

## 2022-03-05 ENCOUNTER — Ambulatory Visit (INDEPENDENT_AMBULATORY_CARE_PROVIDER_SITE_OTHER): Payer: Medicaid Other | Admitting: Family Medicine

## 2022-03-05 VITALS — BP 122/82 | HR 98 | Wt 250.8 lb

## 2022-03-05 DIAGNOSIS — Z8759 Personal history of other complications of pregnancy, childbirth and the puerperium: Secondary | ICD-10-CM

## 2022-03-05 DIAGNOSIS — Z23 Encounter for immunization: Secondary | ICD-10-CM

## 2022-03-05 DIAGNOSIS — O99013 Anemia complicating pregnancy, third trimester: Secondary | ICD-10-CM

## 2022-03-05 DIAGNOSIS — Z3480 Encounter for supervision of other normal pregnancy, unspecified trimester: Secondary | ICD-10-CM | POA: Diagnosis not present

## 2022-03-05 DIAGNOSIS — O99210 Obesity complicating pregnancy, unspecified trimester: Secondary | ICD-10-CM

## 2022-03-05 DIAGNOSIS — R8271 Bacteriuria: Secondary | ICD-10-CM

## 2022-03-05 NOTE — Progress Notes (Signed)
Present for centeringpregnancy today Kathryn Pena

## 2022-03-07 ENCOUNTER — Encounter: Payer: Self-pay | Admitting: Family Medicine

## 2022-03-07 DIAGNOSIS — O99013 Anemia complicating pregnancy, third trimester: Secondary | ICD-10-CM | POA: Insufficient documentation

## 2022-03-07 DIAGNOSIS — O99210 Obesity complicating pregnancy, unspecified trimester: Secondary | ICD-10-CM | POA: Insufficient documentation

## 2022-03-07 NOTE — Progress Notes (Signed)
     PRENATAL VISIT NOTE  Subjective:  Kathryn Pena is a 21 y.o. G3P1011 at [redacted]w[redacted]d being seen today for ongoing prenatal care.  She is currently monitored for the following issues for this low-risk pregnancy and has History of pre-eclampsia; Supervision of other normal pregnancy, antepartum; Group B streptococcal bacteriuria; and Anemia affecting pregnancy in third trimester on their problem list.  Patient reports no complaints.  Contractions: Not present. Vag. Bleeding: None.  Movement: Present. Denies leaking of fluid.   The following portions of the patient's history were reviewed and updated as appropriate: allergies, current medications, past family history, past medical history, past social history, past surgical history and problem list.   Objective:   Vitals:   03/05/22 1659  BP: 122/82  Pulse: 98  Weight: 250 lb 12.8 oz (113.8 kg)    Fetal Status: Fetal Heart Rate (bpm): 127 Fundal Height: 34 cm Movement: Present  Presentation: Vertex  General:  Alert, oriented and cooperative. Patient is in no acute distress.  Skin: Skin is warm and dry. No rash noted.   Cardiovascular: Normal heart rate noted  Respiratory: Normal respiratory effort, no problems with respiration noted  Abdomen: Soft, gravid, appropriate for gestational age.  Pain/Pressure: Absent     Pelvic: Cervical exam deferred        Extremities: Normal range of motion.  Edema: None  Mental Status: Normal mood and affect. Normal behavior. Normal judgment and thought content.   Assessment and Plan:  Pregnancy: G3P1011 at [redacted]w[redacted]d 1. Supervision of other normal pregnancy, antepartum  Centering Pregnancy, Session#7: Reviewed resources in [employer].   Facilitated discussion today:  postpartum communication and postpartum mood changes Mindfulness activity with mindful listening  Fundal height and FHR appropriate today unless noted otherwise in plan. Patient to continue group care.   - Tdap vaccine greater  than or equal to 7yo IM TWG=32 lb 12.8 oz (14.9 kg) which is above goal for starting BMI  2. Group B streptococcal bacteriuria PCN in labor, no 36wk swab needed  3. History of pre-eclampsia Continue ASA BP WNL  4. Anemia, pregnancy - Repeat CBC ordered, plan to get 5/31 when on site for MFM [place]   Preterm labor symptoms and general obstetric precautions including but not limited to vaginal bleeding, contractions, leaking of fluid and fetal movement were reviewed in detail with the patient. Please refer to After Visit Summary for other counseling recommendations.   Return in about 2 weeks (around 03/19/2022) for Centering Pregnancy.  Future Appointments  Date Time Provider Department Center  03/12/2022 10:45 AM [facility] NURSE [facility]  03/12/2022 11:00 AM [facility] US1 [facility]  03/19/2022  2:00 PM CENTERING PROVIDER [facility]  04/02/2022  2:00 PM CENTERING PROVIDER [facility]  04/23/2022  2:00 PM CENTERING PROVIDER [facility]    Federico Flake, MD

## 2022-03-12 ENCOUNTER — Other Ambulatory Visit: Payer: Medicaid Other

## 2022-03-12 ENCOUNTER — Ambulatory Visit: Payer: Medicaid Other | Admitting: *Deleted

## 2022-03-12 ENCOUNTER — Ambulatory Visit: Payer: Medicaid Other | Attending: Obstetrics and Gynecology

## 2022-03-12 ENCOUNTER — Encounter: Payer: Self-pay | Admitting: *Deleted

## 2022-03-12 VITALS — BP 132/79 | HR 86

## 2022-03-12 DIAGNOSIS — O99213 Obesity complicating pregnancy, third trimester: Secondary | ICD-10-CM | POA: Diagnosis not present

## 2022-03-12 DIAGNOSIS — O99210 Obesity complicating pregnancy, unspecified trimester: Secondary | ICD-10-CM | POA: Insufficient documentation

## 2022-03-12 DIAGNOSIS — O99013 Anemia complicating pregnancy, third trimester: Secondary | ICD-10-CM | POA: Diagnosis present

## 2022-03-12 DIAGNOSIS — Z3A34 34 weeks gestation of pregnancy: Secondary | ICD-10-CM

## 2022-03-12 DIAGNOSIS — O09293 Supervision of pregnancy with other poor reproductive or obstetric history, third trimester: Secondary | ICD-10-CM

## 2022-03-12 DIAGNOSIS — Z6833 Body mass index (BMI) 33.0-33.9, adult: Secondary | ICD-10-CM

## 2022-03-12 LAB — CBC
Hematocrit: 29.1 % — ABNORMAL LOW (ref 34.0–46.6)
Hemoglobin: 9.8 g/dL — ABNORMAL LOW (ref 11.1–15.9)
MCH: 25.9 pg — ABNORMAL LOW (ref 26.6–33.0)
MCHC: 33.7 g/dL (ref 31.5–35.7)
MCV: 77 fL — ABNORMAL LOW (ref 79–97)
Platelets: 241 10*3/uL (ref 150–450)
RBC: 3.78 x10E6/uL (ref 3.77–5.28)
RDW: 14.1 % (ref 11.7–15.4)
WBC: 5.9 10*3/uL (ref 3.4–10.8)

## 2022-03-18 ENCOUNTER — Encounter: Payer: Self-pay | Admitting: Family Medicine

## 2022-03-18 ENCOUNTER — Other Ambulatory Visit: Payer: Self-pay | Admitting: Family Medicine

## 2022-03-18 DIAGNOSIS — D508 Other iron deficiency anemias: Secondary | ICD-10-CM

## 2022-03-18 DIAGNOSIS — O99013 Anemia complicating pregnancy, third trimester: Secondary | ICD-10-CM

## 2022-03-19 ENCOUNTER — Encounter: Payer: Self-pay | Admitting: *Deleted

## 2022-03-19 ENCOUNTER — Encounter: Payer: Medicaid Other | Admitting: Family Medicine

## 2022-03-19 ENCOUNTER — Telehealth: Payer: Self-pay

## 2022-03-19 NOTE — Telephone Encounter (Signed)
Kathryn scheduled for Venofer infusion at Kathryn Pena on 03/27/22 at [place].

## 2022-03-20 ENCOUNTER — Telehealth: Payer: Self-pay | Admitting: *Deleted

## 2022-03-20 NOTE — Telephone Encounter (Signed)
Kathryn Pena missed her CenteringPregnancy appointment yesterday, I called to follow up with her and was unable to reach her. I will send detailed MyChart message. Nancy Fetter

## 2022-03-25 ENCOUNTER — Other Ambulatory Visit (HOSPITAL_COMMUNITY)
Admission: RE | Admit: 2022-03-25 | Discharge: 2022-03-25 | Disposition: A | Discharge status: Home / Self Care | Payer: Medicaid Other | Source: Ambulatory Visit | Attending: Family Medicine | Admitting: Family Medicine

## 2022-03-25 ENCOUNTER — Ambulatory Visit: Payer: Medicaid Other

## 2022-03-25 ENCOUNTER — Ambulatory Visit (INDEPENDENT_AMBULATORY_CARE_PROVIDER_SITE_OTHER): Payer: Medicaid Other | Admitting: General Practice

## 2022-03-25 VITALS — BP 130/86 | HR 98 | Ht 68.0 in | Wt 247.0 lb

## 2022-03-25 DIAGNOSIS — Z3480 Encounter for supervision of other normal pregnancy, unspecified trimester: Secondary | ICD-10-CM

## 2022-03-25 DIAGNOSIS — Z0130 Encounter for examination of blood pressure without abnormal findings: Secondary | ICD-10-CM

## 2022-03-25 NOTE — Progress Notes (Signed)
Patient presents to office today for BP check and gc/ch swabs as she missed her last centering appt. Patient reports headaches every day for past 3 weeks with "floating spots". She has tried rest and tylenol but that only helps minimally. + 3 edema noted in lower legs. Denies dizziness. Patient has history of pre-e. She also reports unbearable back pain for the past 2 weeks. She reports she has been having back pain this whole pregnancy but it has recently got worse. Denies dysuria or incomplete emptying of bladder. Recommended she try heating pad on low, gentle prenatal yoga stretches, and maternity support belt for relief. Discussed headaches with Dr Crissie Reese who recommends Pre-E labs. Bloodwork collected as well as urine. Patient will follow up at Centering appt on 6/21.   Chase Caller RN BSN 03/25/22

## 2022-03-26 LAB — COMPREHENSIVE METABOLIC PANEL
ALT: 11 IU/L (ref 0–32)
AST: 15 IU/L (ref 0–40)
Albumin/Globulin Ratio: 1.5 (ref 1.2–2.2)
Albumin: 4 g/dL (ref 3.9–5.0)
Alkaline Phosphatase: 120 IU/L (ref 44–121)
BUN/Creatinine Ratio: 9 (ref 9–23)
BUN: 6 mg/dL (ref 6–20)
Bilirubin Total: <0.2 mg/dL (ref 0.0–1.2)
CO2: 18 mmol/L — ABNORMAL LOW (ref 20–29)
Calcium: 9.2 mg/dL (ref 8.7–10.2)
Chloride: 104 mmol/L (ref 96–106)
Creatinine, Ser: 0.7 mg/dL (ref 0.57–1.00)
Globulin, Total: 2.6 g/dL (ref 1.5–4.5)
Glucose: 92 mg/dL (ref 70–99)
Potassium: 4 mmol/L (ref 3.5–5.2)
Sodium: 137 mmol/L (ref 134–144)
Total Protein: 6.6 g/dL (ref 6.0–8.5)
eGFR: 125 mL/min/{1.73_m2} (ref >59)

## 2022-03-26 LAB — GC/CHLAMYDIA PROBE AMP (~~LOC~~) NOT AT ARMC
Chlamydia: NEGATIVE
Comment: NEGATIVE
Comment: NORMAL
Neisseria Gonorrhea: NEGATIVE

## 2022-03-26 LAB — CBC
Hematocrit: 30.5 % — ABNORMAL LOW (ref 34.0–46.6)
Hemoglobin: 10 g/dL — ABNORMAL LOW (ref 11.1–15.9)
MCH: 25.1 pg — ABNORMAL LOW (ref 26.6–33.0)
MCHC: 32.8 g/dL (ref 31.5–35.7)
MCV: 76 fL — ABNORMAL LOW (ref 79–97)
Platelets: 249 10*3/uL (ref 150–450)
RBC: 3.99 x10E6/uL (ref 3.77–5.28)
RDW: 14.2 % (ref 11.7–15.4)
WBC: 5.7 10*3/uL (ref 3.4–10.8)

## 2022-03-26 LAB — PROTEIN / CREATININE RATIO, URINE
Creatinine, Urine: 171 mg/dL
Protein, Ur: 28.4 mg/dL
Protein/Creat Ratio: 166 mg/g creat (ref 0–200)

## 2022-03-27 ENCOUNTER — Non-Acute Institutional Stay (HOSPITAL_COMMUNITY)
Admission: RE | Admit: 2022-03-27 | Discharge: 2022-03-27 | Disposition: A | Discharge status: Home / Self Care | Payer: Medicaid Other | Source: Ambulatory Visit | Attending: Internal Medicine | Admitting: Internal Medicine

## 2022-03-27 DIAGNOSIS — O99013 Anemia complicating pregnancy, third trimester: Secondary | ICD-10-CM | POA: Diagnosis present

## 2022-03-27 DIAGNOSIS — D508 Other iron deficiency anemias: Secondary | ICD-10-CM | POA: Insufficient documentation

## 2022-03-27 MED ORDER — SODIUM CHLORIDE 0.9 % IV SOLN
300.0000 mg | INTRAVENOUS | Status: DC
  Administered 2022-03-27: 300 mg via INTRAVENOUS
  Filled 2022-03-27: qty 15

## 2022-03-27 MED ORDER — SODIUM CHLORIDE 0.9 % IV SOLN: INTRAVENOUS | Status: DC | PRN

## 2022-03-27 MED ORDER — DIPHENHYDRAMINE HCL 50 MG/ML IJ SOLN: 25.0000 mg | Freq: Once | INTRAMUSCULAR | Status: DC | PRN

## 2022-03-27 NOTE — Progress Notes (Signed)
PATIENT CARE CENTER NOTE   Diagnosis: Anemia during pregnancy in third trimester [O99.013]; Other iron deficiency anemia [D50.8]   Provider: Lyndel Safe, MD   Procedure: Venofer infusion    Note:  Patient received Venofer 300 mg infusion (dose # 1 of 2) via PIV. No pre medication ordered. Patient tolerated infusion well with no adverse reaction. Observed patient for 1 hour post infusion per order. Vital signs stable. Discharge instructions given. Patient scheduled to come back on 04/04/22 for next infusion.  Alert, oriented and ambulatory at discharge. Discharge home with grandmother.

## 2022-04-02 ENCOUNTER — Ambulatory Visit (INDEPENDENT_AMBULATORY_CARE_PROVIDER_SITE_OTHER): Payer: Medicaid Other | Admitting: Family Medicine

## 2022-04-02 VITALS — BP 123/80 | HR 84 | Wt 257.4 lb

## 2022-04-02 DIAGNOSIS — O99210 Obesity complicating pregnancy, unspecified trimester: Secondary | ICD-10-CM

## 2022-04-02 DIAGNOSIS — R8271 Bacteriuria: Secondary | ICD-10-CM

## 2022-04-02 DIAGNOSIS — Z3480 Encounter for supervision of other normal pregnancy, unspecified trimester: Secondary | ICD-10-CM

## 2022-04-02 DIAGNOSIS — O99013 Anemia complicating pregnancy, third trimester: Secondary | ICD-10-CM

## 2022-04-02 NOTE — Progress Notes (Unsigned)
   PRENATAL VISIT NOTE  Subjective:  Kathryn Pena is a 21 y.o. G3P1011 at [redacted]w[redacted]d being seen today for ongoing prenatal care.  She is currently monitored for the following issues for this {Blank single:19197::"high-risk","low-risk"} pregnancy and has History of pre-eclampsia; Supervision of other normal pregnancy, antepartum; Group B streptococcal bacteriuria; Anemia affecting pregnancy in third trimester; and Obesity affecting pregnancy on their problem list.  Patient reports {sx:14538}.   .  .   . Denies leaking of fluid.   The following portions of the patient's history were reviewed and updated as appropriate: allergies, current medications, past family history, past medical history, past social history, past surgical history and problem list.   Objective:   Vitals:   04/02/22 1444  BP: 123/80  Pulse: 84  Weight: 257 lb 6.4 oz (116.8 kg)    Fetal Status:           General:  Alert, oriented and cooperative. Patient is in no acute distress.  Skin: Skin is warm and dry. No rash noted.   Cardiovascular: Normal heart rate noted  Respiratory: Normal respiratory effort, no problems with respiration noted  Abdomen: Soft, gravid, appropriate for gestational age.        Pelvic: {Blank single:19197::"Cervical exam performed in the presence of a chaperone","Cervical exam deferred"}        Extremities: Normal range of motion.     Mental Status: Normal mood and affect. Normal behavior. Normal judgment and thought content.   Assessment and Plan:  Pregnancy: G3P1011 at [redacted]w[redacted]d 1. Anemia affecting pregnancy in third trimester ***  2. Obesity affecting pregnancy, antepartum ***  {Blank single:19197::"Term","Preterm"} labor symptoms and general obstetric precautions including but not limited to vaginal bleeding, contractions, leaking of fluid and fetal movement were reviewed in detail with the patient. Please refer to After Visit Summary for other counseling recommendations.   No  follow-ups on file.  Future Appointments  Date Time Provider Department Center  04/04/2022  9:00 AM [facility] RM 2 [facility] None  04/10/2022  1:15 PM Federico Flake, MD [facility]  04/23/2022  2:00 PM CENTERING PROVIDER [facility]    Federico Flake, MD

## 2022-04-04 ENCOUNTER — Non-Acute Institutional Stay (HOSPITAL_COMMUNITY)
Admission: RE | Admit: 2022-04-04 | Discharge: 2022-04-04 | Disposition: A | Discharge status: Home / Self Care | Payer: Medicaid Other | Source: Ambulatory Visit | Attending: Internal Medicine | Admitting: Internal Medicine

## 2022-04-04 DIAGNOSIS — D508 Other iron deficiency anemias: Secondary | ICD-10-CM | POA: Diagnosis not present

## 2022-04-04 DIAGNOSIS — O99013 Anemia complicating pregnancy, third trimester: Secondary | ICD-10-CM | POA: Diagnosis not present

## 2022-04-04 MED ORDER — DIPHENHYDRAMINE HCL 50 MG/ML IJ SOLN
25.0000 mg | Freq: Once | INTRAMUSCULAR | Status: DC | PRN
Start: 2022-04-04 — End: 2022-04-05

## 2022-04-04 MED ORDER — IRON SUCROSE 20 MG/ML IV SOLN
300.0000 mg | Freq: Once | INTRAVENOUS | Status: AC
  Administered 2022-04-04: 300 mg via INTRAVENOUS
  Filled 2022-04-04: qty 15

## 2022-04-04 MED ORDER — SODIUM CHLORIDE 0.9 % IV SOLN: INTRAVENOUS | Status: DC | PRN

## 2022-04-10 ENCOUNTER — Ambulatory Visit (INDEPENDENT_AMBULATORY_CARE_PROVIDER_SITE_OTHER): Payer: Medicaid Other | Admitting: Family Medicine

## 2022-04-10 ENCOUNTER — Encounter (HOSPITAL_COMMUNITY): Payer: Self-pay | Admitting: Family Medicine

## 2022-04-10 ENCOUNTER — Other Ambulatory Visit: Payer: Self-pay

## 2022-04-10 ENCOUNTER — Inpatient Hospital Stay (HOSPITAL_COMMUNITY)
Admission: AD | Admit: 2022-04-10 | Discharge: 2022-04-10 | Disposition: A | Discharge status: Home / Self Care | Payer: Medicaid Other | Attending: Family Medicine | Admitting: Family Medicine

## 2022-04-10 VITALS — BP 130/88 | HR 108 | Wt 256.0 lb

## 2022-04-10 DIAGNOSIS — O26893 Other specified pregnancy related conditions, third trimester: Secondary | ICD-10-CM | POA: Diagnosis not present

## 2022-04-10 DIAGNOSIS — Z3A39 39 weeks gestation of pregnancy: Secondary | ICD-10-CM | POA: Insufficient documentation

## 2022-04-10 DIAGNOSIS — R030 Elevated blood-pressure reading, without diagnosis of hypertension: Secondary | ICD-10-CM | POA: Diagnosis present

## 2022-04-10 DIAGNOSIS — O99013 Anemia complicating pregnancy, third trimester: Secondary | ICD-10-CM

## 2022-04-10 DIAGNOSIS — O1203 Gestational edema, third trimester: Secondary | ICD-10-CM | POA: Diagnosis not present

## 2022-04-10 DIAGNOSIS — O99213 Obesity complicating pregnancy, third trimester: Secondary | ICD-10-CM

## 2022-04-10 DIAGNOSIS — O09293 Supervision of pregnancy with other poor reproductive or obstetric history, third trimester: Secondary | ICD-10-CM

## 2022-04-10 DIAGNOSIS — Z8759 Personal history of other complications of pregnancy, childbirth and the puerperium: Secondary | ICD-10-CM

## 2022-04-10 DIAGNOSIS — O163 Unspecified maternal hypertension, third trimester: Secondary | ICD-10-CM

## 2022-04-10 DIAGNOSIS — R8271 Bacteriuria: Secondary | ICD-10-CM

## 2022-04-10 DIAGNOSIS — Z3480 Encounter for supervision of other normal pregnancy, unspecified trimester: Secondary | ICD-10-CM

## 2022-04-10 LAB — CBC
HCT: 32.4 % — ABNORMAL LOW (ref 36.0–46.0)
Hemoglobin: 10.7 g/dL — ABNORMAL LOW (ref 12.0–15.0)
MCH: 26.7 pg (ref 26.0–34.0)
MCHC: 33 g/dL (ref 30.0–36.0)
MCV: 80.8 fL (ref 80.0–100.0)
Platelets: 208 10*3/uL (ref 150–400)
RBC: 4.01 MIL/uL (ref 3.87–5.11)
RDW: 20 % — ABNORMAL HIGH (ref 11.5–15.5)
WBC: 6.1 10*3/uL (ref 4.0–10.5)
nRBC: 0 % (ref 0.0–0.2)

## 2022-04-10 LAB — COMPREHENSIVE METABOLIC PANEL
ALT: 10 U/L (ref 0–44)
AST: 14 U/L — ABNORMAL LOW (ref 15–41)
Albumin: 2.9 g/dL — ABNORMAL LOW (ref 3.5–5.0)
Alkaline Phosphatase: 97 U/L (ref 38–126)
Anion gap: 9 (ref 5–15)
BUN: 5 mg/dL — ABNORMAL LOW (ref 6–20)
CO2: 20 mmol/L — ABNORMAL LOW (ref 22–32)
Calcium: 8.7 mg/dL — ABNORMAL LOW (ref 8.9–10.3)
Chloride: 108 mmol/L (ref 98–111)
Creatinine, Ser: 0.7 mg/dL (ref 0.44–1.00)
GFR, Estimated: >60 mL/min (ref >60)
Glucose, Bld: 102 mg/dL — ABNORMAL HIGH (ref 70–99)
Potassium: 3.4 mmol/L — ABNORMAL LOW (ref 3.5–5.1)
Sodium: 137 mmol/L (ref 135–145)
Total Bilirubin: 0.5 mg/dL (ref 0.3–1.2)
Total Protein: 6.3 g/dL — ABNORMAL LOW (ref 6.5–8.1)

## 2022-04-10 LAB — PROTEIN / CREATININE RATIO, URINE
Creatinine, Urine: 68.34 mg/dL
Protein Creatinine Ratio: 0.1 mg/mg{Cre} (ref 0.00–0.15)
Total Protein, Urine: 7 mg/dL

## 2022-04-10 MED ORDER — LABETALOL HCL 5 MG/ML IV SOLN: 40.0000 mg | INTRAVENOUS | Status: DC | PRN

## 2022-04-10 MED ORDER — LABETALOL HCL 5 MG/ML IV SOLN: 80.0000 mg | INTRAVENOUS | Status: DC | PRN

## 2022-04-10 MED ORDER — HYDRALAZINE HCL 20 MG/ML IJ SOLN: 10.0000 mg | INTRAMUSCULAR | Status: DC | PRN

## 2022-04-10 MED ORDER — CYCLOBENZAPRINE HCL 5 MG PO TABS
10.0000 mg | ORAL_TABLET | Freq: Once | ORAL | Status: AC
  Administered 2022-04-10: 10 mg via ORAL
  Filled 2022-04-10: qty 2

## 2022-04-10 MED ORDER — LABETALOL HCL 5 MG/ML IV SOLN: 20.0000 mg | INTRAVENOUS | Status: DC | PRN

## 2022-04-10 NOTE — Progress Notes (Signed)
   PRENATAL VISIT NOTE  Subjective:  Kathryn Pena is a 21 y.o. G3P1011 at [redacted]w[redacted]d being seen today for ongoing prenatal care.  She is currently monitored for the following issues for this high-risk pregnancy and has History of pre-eclampsia; Supervision of other normal pregnancy, antepartum; Group B streptococcal bacteriuria; Anemia affecting pregnancy in third trimester; and Obesity affecting pregnancy on their problem list.  Patient reports headache. Reports a HA for several days that is not going away with Tylenol and is taking 1000mg . She reports it was working and then stopped working. She has noticed swelling in her bilateral legs this past week that is worse. She reports she is seeing spots/sparkles. Denies RUQ pain.   Contractions: Irregular. Vag. Bleeding: None.  Movement: Present. Denies leaking of fluid.   The following portions of the patient's history were reviewed and updated as appropriate: allergies, current medications, past family history, past medical history, past social history, past surgical history and problem list.   Objective:   Vitals:   04/10/22 1333 04/10/22 1340  BP: (!) 142/88 130/88  Pulse: 94 (!) 108  Weight: 256 lb (116.1 kg)     Fetal Status: Fetal Heart Rate (bpm): 140   Movement: Present     General:  Alert, oriented and cooperative. Patient is in no acute distress.  Skin: Skin is warm and dry. No rash noted.   Cardiovascular: Normal heart rate noted  Respiratory: Normal respiratory effort, no problems with respiration noted  Abdomen: Soft, gravid, appropriate for gestational age.  Pain/Pressure: Present     Pelvic: Cervical exam deferred        Extremities: Normal range of motion.  Edema: Moderate pitting, indentation subsides rapidly  Mental Status: Normal mood and affect. Normal behavior. Normal judgment and thought content.   Assessment and Plan:  Pregnancy: G3P1011 at [redacted]w[redacted]d 1. Group B streptococcal bacteriuria PCN in labor  2. Anemia  affecting pregnancy in third trimester Last hgb =10.0  3. History of pre-eclampsia On ASA  4. Obesity affecting pregnancy in third trimester TWG=38 lb (17.2 kg)   5. Supervision of other normal pregnancy, antepartum Reviewed my concerns about PEC today given history, HA, edema, and initial BP that was elevated. Her BP has been going up since about 34 weeks but not above 140/90 until today Recommended going to MAU for serial blood pressures and labs for r/o PEC and discussed high likelihood of staying for labor.  Message sent to MAU providers and charge Spoke with [redacted]w[redacted]d CNM  Term labor symptoms and general obstetric precautions including but not limited to vaginal bleeding, contractions, leaking of fluid and fetal movement were reviewed in detail with the patient. Please refer to After Visit Summary for other counseling recommendations.   No follow-ups on file.  Future Appointments  Date Time Provider Department Center  04/17/2022  2:15 PM [facility]ST [facility]  04/23/2022  2:00 PM CENTERING PROVIDER [facility]    [facility] MD

## 2022-04-10 NOTE — Progress Notes (Signed)
Patient reports daily headaches with seeing spots that is unrelieved with tylenol

## 2022-04-10 NOTE — Discharge Instructions (Signed)
Return to MAU for any signs of pre-eclampsia

## 2022-04-10 NOTE — MAU Note (Signed)
Kathryn Pena is a 21 y.o. at [redacted]w[redacted]d here in MAU reporting: she was sent from MD office for BP evaluation.  Endorses current H/A, states had blurry vision en route to hospital but now resolved.  Denies epigastric pain.  States has been taking Tylenol for H/A, but no relief.  Reports +FM, denies VB or LOF.  Onset of complaint: today Pain score: 6 Vitals:   04/10/22 1453  BP: (!) 150/85  Pulse: (!) 101  Resp: 18  Temp: 98.7 F (37.1 C)  SpO2: 99%     FHT:125 bpm Lab orders placed from triage:   UA

## 2022-04-10 NOTE — MAU Provider Note (Signed)
History     CSN: [id number]  Arrival date and time: 04/10/22 1440   Event Date/Time   First Provider Initiated Contact with Patient 04/10/22 1536      Chief Complaint  Patient presents with   BP Eval   HPI Ms. JULIEANA DEMIRJIAN is a 21 y.o. year old G75P1011 female at [redacted]w[redacted]d weeks gestation who was sent to MAU from her OB appt this afternoon reporting H/A for 1 month, but not relieved with Tylenol 1000 mg po every 8 hours x 1 week, seeing spots & "sparkles," and swelling in lower extremities. She has a h/o PEC with last pregnancy (3 yrs ago). She rates her H/A a 5/10. She reports good FM today. She denies any epigastric pan, RUQ pain, UC's, VB, or LOF. She receives [facility] with [facility]; next appt is 04/17/2022.   OB History     Gravida  3   Para  1   Term  1   Preterm      AB  1   Living  1      SAB      IAB  1   Ectopic      Multiple  0   Live Births  1           Past Medical History:  Diagnosis Date   Pregnancy induced hypertension     Past Surgical History:  Procedure Laterality Date   NO PAST SURGERIES      Family History  Problem Relation Age of Onset   Healthy Mother    Hypertension Maternal Aunt    Diabetes Neg Hx    Heart disease Neg Hx    Stroke Neg Hx     Social History   Tobacco Use   Smoking status: Former    Types: Cigars    Quit date: 06/27/2018    Years since quitting: 3.7   Smokeless tobacco: Never  Vaping Use   Vaping Use: Never used  Substance Use Topics   Alcohol use: No   Drug use: No    Allergies: No Known Allergies  Medications Prior to Admission  Medication Sig Dispense Refill Last Dose   acetaminophen (TYLENOL) 500 MG tablet Take 2 tablets (1,000 mg total) by mouth every 6 (six) hours as needed for moderate pain. 30 tablet 0    Prenatal Vit-Fe Phos-FA-Omega (VITAFOL GUMMIES) 3.33-0.333-34.8 MG CHEW Chew 1 tablet by mouth daily. 90 tablet 5     Review of Systems  Constitutional: Negative.   HENT: Negative.     Eyes:  Positive for visual disturbance (seeing spots and "sparkles" in vision).  Cardiovascular:  Positive for leg swelling.  Gastrointestinal: Negative.   Endocrine: Negative.   Genitourinary: Negative.        (+) FM  Musculoskeletal: Negative.   Skin: Negative.   Allergic/Immunologic: Negative.   Neurological:  Positive for headaches (5/10; not relieved with Tylenol).  Hematological: Negative.   Psychiatric/Behavioral: Negative.     Physical Exam   Patient Vitals for the past 24 hrs:  BP Temp Temp src Pulse Resp SpO2 Height Weight  04/10/22 1701 127/80 -- -- 96 -- -- -- --  04/10/22 1646 130/75 -- -- 99 -- -- -- --  04/10/22 1631 115/65 -- -- 99 -- -- -- --  04/10/22 1616 127/80 -- -- 96 -- -- -- --  04/10/22 1600 136/81 -- -- 94 -- 100 % -- --  04/10/22 1546 133/81 -- -- (!) 102 -- -- -- --  04/10/22 1515  140/80 -- -- (!) 102 -- 98 % -- --  04/10/22 1453 (!) 150/85 98.7 F (37.1 C) Oral (!) 101 18 99 % -- --  04/10/22 1450 -- -- -- -- -- -- 5\' 8"  (1.727 m) 117.1 kg     Physical Exam Constitutional:      Appearance: Normal appearance. She is obese.  Cardiovascular:     Rate and Rhythm: Regular rhythm. Tachycardia present.     Pulses: Normal pulses.     Heart sounds: Normal heart sounds.  Pulmonary:     Effort: Pulmonary effort is normal.     Breath sounds: Normal breath sounds.  Abdominal:     General: Bowel sounds are normal.     Palpations: Abdomen is soft.  Musculoskeletal:        General: Normal range of motion.     Right lower leg: Edema (+1) present.     Left lower leg: Edema (+1) present.  Skin:    General: Skin is warm and dry.  Neurological:     General: No focal deficit present.     Mental Status: She is alert and oriented to person, place, and time.  Psychiatric:        Mood and Affect: Mood normal.        Behavior: Behavior normal.        Thought Content: Thought content normal.        Judgment: Judgment normal.    MAU Course   Procedures  MDM CCUA CBC CMP P/C Ratio Serial BP's  Saline lock Labetalol Protocol  *Consult with Dr. @ 1800 - notified of patient's complaints, assessments, lab & NST results, recommended tx plan d/c home and have BP rechecked in the office tomorrow.   Results for orders placed or performed during the hospital encounter of 04/10/22 (from the past 24 hour(s))  Comprehensive metabolic panel     Status: Abnormal   Collection Time: 04/10/22  3:09 PM  Result Value Ref Range   Sodium 137 135 - 145 mmol/L   Potassium 3.4 (L) 3.5 - 5.1 mmol/L   Chloride 108 98 - 111 mmol/L   CO2 20 (L) 22 - 32 mmol/L   Glucose, Bld 102 (H) 70 - 99 mg/dL   BUN 5 (L) 6 - 20 mg/dL   Creatinine, Ser 04/12/22 0.44 - 1.00 mg/dL   Calcium 8.7 (L) 8.9 - 10.3 mg/dL   Total Protein 6.3 (L) 6.5 - 8.1 g/dL   Albumin 2.9 (L) 3.5 - 5.0 g/dL   AST 14 (L) 15 - 41 U/L   ALT 10 0 - 44 U/L   Alkaline Phosphatase 97 38 - 126 U/L   Total Bilirubin 0.5 0.3 - 1.2 mg/dL   GFR, Estimated 5.40 >08 mL/min   Anion gap 9 5 - 15  CBC     Status: Abnormal   Collection Time: 04/10/22  3:09 PM  Result Value Ref Range   WBC 6.1 4.0 - 10.5 K/uL   RBC 4.01 3.87 - 5.11 MIL/uL   Hemoglobin 10.7 (L) 12.0 - 15.0 g/dL   HCT 04/12/22 (L) 61.9 - 50.9 %   MCV 80.8 80.0 - 100.0 fL   MCH 26.7 26.0 - 34.0 pg   MCHC 33.0 30.0 - 36.0 g/dL   RDW 32.6 (H) 71.2 - 45.8 %   Platelets 208 150 - 400 K/uL   nRBC 0.0 0.0 - 0.2 %  Protein / creatinine ratio, urine     Status: None   Collection Time:  04/10/22  3:11 PM  Result Value Ref Range   Creatinine, Urine 68.34 mg/dL   Total Protein, Urine 7 mg/dL   Protein Creatinine Ratio 0.10 0.00 - 0.15 mg/mg[Cre]      Assessment and Plan  Elevated blood pressure affecting pregnancy in third trimester, antepartum  - Advised of normal PEC labs - Information provided on PEC   H/O pre-eclampsia in prior pregnancy, currently pregnant, third trimester - No dx of PEC  Edema during pregnancy in third  trimester  - Advised to elevate BLE and increase water intake  [redacted] weeks gestation of pregnancy   - Discharge patient - Someone will call with time to have BP recheck tomorrow at [facility] - message sent to [facility] - Patient verbalized an understanding of the plan of care and agrees.    Raelyn Mora, CNM 04/10/2022, 3:36 PM

## 2022-04-11 ENCOUNTER — Other Ambulatory Visit: Payer: Self-pay | Admitting: Advanced Practice Midwife

## 2022-04-11 ENCOUNTER — Ambulatory Visit (INDEPENDENT_AMBULATORY_CARE_PROVIDER_SITE_OTHER): Payer: Medicaid Other

## 2022-04-11 VITALS — BP 138/92 | HR 108 | Wt 259.0 lb

## 2022-04-11 DIAGNOSIS — O133 Gestational [pregnancy-induced] hypertension without significant proteinuria, third trimester: Secondary | ICD-10-CM

## 2022-04-11 DIAGNOSIS — Z3480 Encounter for supervision of other normal pregnancy, unspecified trimester: Secondary | ICD-10-CM

## 2022-04-11 DIAGNOSIS — Z0130 Encounter for examination of blood pressure without abnormal findings: Secondary | ICD-10-CM

## 2022-04-11 DIAGNOSIS — Z8759 Personal history of other complications of pregnancy, childbirth and the puerperium: Secondary | ICD-10-CM

## 2022-04-11 NOTE — Progress Notes (Signed)
Blood Pressure Check Visit  Kathryn Pena is here at [redacted]w[redacted]d of pregnancy for blood pressure check following visit to MAU on 04/10/22. Per chart review, BP elevated during office visit yesterday. Pre-e labs WNL at MAU yesterday. BP today is also elevated at 138/92. Continues to have headache. Reviewed with Anyanwu, MD who states patient has gestational hypertension and should be induced ASAP. IOL scheduled for tomorrow AM (04/12/22). Pt agreeable to plan.   Marjo Bicker, RN 04/11/2022  10:20 AM

## 2022-04-12 ENCOUNTER — Inpatient Hospital Stay (HOSPITAL_COMMUNITY): Payer: Medicaid Other

## 2022-04-12 ENCOUNTER — Inpatient Hospital Stay (HOSPITAL_COMMUNITY)
Admission: AD | Admit: 2022-04-12 | Discharge: 2022-04-15 | DRG: 806 | Disposition: A | Discharge status: Home / Self Care | Payer: Medicaid Other | Attending: Obstetrics and Gynecology | Admitting: Obstetrics and Gynecology

## 2022-04-12 DIAGNOSIS — Z87891 Personal history of nicotine dependence: Secondary | ICD-10-CM

## 2022-04-12 DIAGNOSIS — D62 Acute posthemorrhagic anemia: Secondary | ICD-10-CM | POA: Diagnosis not present

## 2022-04-12 DIAGNOSIS — Z3A39 39 weeks gestation of pregnancy: Secondary | ICD-10-CM

## 2022-04-12 DIAGNOSIS — O99824 Streptococcus B carrier state complicating childbirth: Secondary | ICD-10-CM | POA: Diagnosis present

## 2022-04-12 DIAGNOSIS — O133 Gestational [pregnancy-induced] hypertension without significant proteinuria, third trimester: Principal | ICD-10-CM

## 2022-04-12 DIAGNOSIS — O99820 Streptococcus B carrier state complicating pregnancy: Secondary | ICD-10-CM | POA: Diagnosis not present

## 2022-04-12 DIAGNOSIS — O99214 Obesity complicating childbirth: Secondary | ICD-10-CM | POA: Diagnosis present

## 2022-04-12 DIAGNOSIS — O139 Gestational [pregnancy-induced] hypertension without significant proteinuria, unspecified trimester: Secondary | ICD-10-CM | POA: Diagnosis present

## 2022-04-12 DIAGNOSIS — R8271 Bacteriuria: Secondary | ICD-10-CM | POA: Diagnosis present

## 2022-04-12 DIAGNOSIS — O134 Gestational [pregnancy-induced] hypertension without significant proteinuria, complicating childbirth: Secondary | ICD-10-CM | POA: Diagnosis present

## 2022-04-12 DIAGNOSIS — O9081 Anemia of the puerperium: Secondary | ICD-10-CM | POA: Diagnosis not present

## 2022-04-12 DIAGNOSIS — Z3480 Encounter for supervision of other normal pregnancy, unspecified trimester: Secondary | ICD-10-CM

## 2022-04-12 LAB — PROTEIN / CREATININE RATIO, URINE
Creatinine, Urine: 81.34 mg/dL
Protein Creatinine Ratio: 0.09 mg/mg{Cre} (ref 0.00–0.15)
Total Protein, Urine: 7 mg/dL

## 2022-04-12 LAB — COMPREHENSIVE METABOLIC PANEL
ALT: 10 U/L (ref 0–44)
AST: 15 U/L (ref 15–41)
Albumin: 2.7 g/dL — ABNORMAL LOW (ref 3.5–5.0)
Alkaline Phosphatase: 88 U/L (ref 38–126)
Anion gap: 9 (ref 5–15)
BUN: <5 mg/dL — ABNORMAL LOW (ref 6–20)
CO2: 20 mmol/L — ABNORMAL LOW (ref 22–32)
Calcium: 7 mg/dL — ABNORMAL LOW (ref 8.9–10.3)
Chloride: 110 mmol/L (ref 98–111)
Creatinine, Ser: 0.64 mg/dL (ref 0.44–1.00)
GFR, Estimated: >60 mL/min (ref >60)
Glucose, Bld: 108 mg/dL — ABNORMAL HIGH (ref 70–99)
Potassium: 4.3 mmol/L (ref 3.5–5.1)
Sodium: 139 mmol/L (ref 135–145)
Total Bilirubin: 0.8 mg/dL (ref 0.3–1.2)
Total Protein: 5.9 g/dL — ABNORMAL LOW (ref 6.5–8.1)

## 2022-04-12 LAB — TYPE AND SCREEN
ABO/RH(D): O POS
Antibody Screen: NEGATIVE

## 2022-04-12 LAB — CBC
HCT: 31 % — ABNORMAL LOW (ref 36.0–46.0)
Hemoglobin: 10.1 g/dL — ABNORMAL LOW (ref 12.0–15.0)
MCH: 26.5 pg (ref 26.0–34.0)
MCHC: 32.6 g/dL (ref 30.0–36.0)
MCV: 81.4 fL (ref 80.0–100.0)
Platelets: 198 10*3/uL (ref 150–400)
RBC: 3.81 MIL/uL — ABNORMAL LOW (ref 3.87–5.11)
RDW: 20.5 % — ABNORMAL HIGH (ref 11.5–15.5)
WBC: 5.9 10*3/uL (ref 4.0–10.5)
nRBC: 0 % (ref 0.0–0.2)

## 2022-04-12 MED ORDER — LEVONORGESTREL 20 MCG/DAY IU IUD
1.0000 | INTRAUTERINE_SYSTEM | Freq: Once | INTRAUTERINE | Status: DC
Start: 2022-04-12 — End: 2022-04-13

## 2022-04-12 MED ORDER — ZOLPIDEM TARTRATE 5 MG PO TABS: 5.0000 mg | ORAL_TABLET | Freq: Every evening | ORAL | Status: DC | PRN

## 2022-04-12 MED ORDER — CYCLOBENZAPRINE HCL 5 MG PO TABS
10.0000 mg | ORAL_TABLET | Freq: Once | ORAL | Status: AC
  Administered 2022-04-12: 10 mg via ORAL
  Filled 2022-04-12: qty 2

## 2022-04-12 MED ORDER — PENICILLIN G POTASSIUM 5000000 UNITS IJ SOLR
5.0000 10*6.[IU] | Freq: Once | INTRAMUSCULAR | Status: AC
  Administered 2022-04-13: 5 10*6.[IU] via INTRAVENOUS
  Filled 2022-04-12: qty 5

## 2022-04-12 MED ORDER — LACTATED RINGERS IV SOLN
500.0000 mL | INTRAVENOUS | Status: DC | PRN
  Administered 2022-04-13: 500 mL via INTRAVENOUS

## 2022-04-12 MED ORDER — MISOPROSTOL 25 MCG QUARTER TABLET
25.0000 ug | ORAL_TABLET | ORAL | Status: DC | PRN
  Administered 2022-04-12 (×2): 25 ug via VAGINAL
  Filled 2022-04-12 (×2): qty 1

## 2022-04-12 MED ORDER — ACETAMINOPHEN 325 MG PO TABS
650.0000 mg | ORAL_TABLET | ORAL | Status: DC | PRN
  Administered 2022-04-12 – 2022-04-13 (×2): 650 mg via ORAL
  Filled 2022-04-12 (×2): qty 2

## 2022-04-12 MED ORDER — FENTANYL CITRATE (PF) 100 MCG/2ML IJ SOLN
50.0000 ug | INTRAMUSCULAR | Status: DC | PRN
  Administered 2022-04-13 (×2): 100 ug via INTRAVENOUS
  Filled 2022-04-12 (×2): qty 2

## 2022-04-12 MED ORDER — SOD CITRATE-CITRIC ACID 500-334 MG/5ML PO SOLN: 30.0000 mL | ORAL | Status: DC | PRN

## 2022-04-12 MED ORDER — FLEET ENEMA 7-19 GM/118ML RE ENEM: 1.0000 | ENEMA | Freq: Every day | RECTAL | Status: DC | PRN

## 2022-04-12 MED ORDER — OXYTOCIN-SODIUM CHLORIDE 30-0.9 UT/500ML-% IV SOLN
2.5000 [IU]/h | INTRAVENOUS | Status: DC
  Filled 2022-04-12: qty 500

## 2022-04-12 MED ORDER — OXYTOCIN BOLUS FROM INFUSION
333.0000 mL | Freq: Once | INTRAVENOUS | Status: AC
  Administered 2022-04-13: 333 mL via INTRAVENOUS

## 2022-04-12 MED ORDER — OXYCODONE-ACETAMINOPHEN 5-325 MG PO TABS: 1.0000 | ORAL_TABLET | ORAL | Status: DC | PRN

## 2022-04-12 MED ORDER — ONDANSETRON HCL 4 MG/2ML IJ SOLN: 4.0000 mg | Freq: Four times a day (QID) | INTRAMUSCULAR | Status: DC | PRN

## 2022-04-12 MED ORDER — LACTATED RINGERS IV SOLN
INTRAVENOUS | Status: DC
  Administered 2022-04-13: 1000 mL via INTRAVENOUS

## 2022-04-12 MED ORDER — LIDOCAINE HCL (PF) 1 % IJ SOLN: 30.0000 mL | INTRAMUSCULAR | Status: DC | PRN

## 2022-04-12 MED ORDER — HYDROXYZINE HCL 50 MG PO TABS: 50.0000 mg | ORAL_TABLET | Freq: Four times a day (QID) | ORAL | Status: DC | PRN

## 2022-04-12 MED ORDER — OXYCODONE-ACETAMINOPHEN 5-325 MG PO TABS: 2.0000 | ORAL_TABLET | ORAL | Status: DC | PRN

## 2022-04-12 MED ORDER — TERBUTALINE SULFATE 1 MG/ML IJ SOLN
0.2500 mg | Freq: Once | INTRAMUSCULAR | Status: AC | PRN
  Administered 2022-04-13: 0.25 mg via SUBCUTANEOUS

## 2022-04-12 MED ORDER — PENICILLIN G POT IN DEXTROSE 60000 UNIT/ML IV SOLN
3.0000 10*6.[IU] | INTRAVENOUS | Status: DC
  Administered 2022-04-12 – 2022-04-13 (×3): 3 10*6.[IU] via INTRAVENOUS
  Filled 2022-04-12 (×3): qty 50

## 2022-04-12 MED ORDER — OXYTOCIN-SODIUM CHLORIDE 30-0.9 UT/500ML-% IV SOLN: 1.0000 m[IU]/min | INTRAVENOUS | Status: DC

## 2022-04-12 MED ORDER — SODIUM CHLORIDE 0.9 % IV SOLN
5.0000 10*6.[IU] | Freq: Once | INTRAVENOUS | Status: AC
  Administered 2022-04-12: 5 10*6.[IU] via INTRAVENOUS
  Filled 2022-04-12: qty 5

## 2022-04-12 MED ORDER — PENICILLIN G POTASSIUM 5000000 UNITS IJ SOLR
5.0000 10*6.[IU] | INTRAMUSCULAR | Status: DC
Start: 2022-04-12 — End: 2022-04-12

## 2022-04-12 NOTE — Progress Notes (Signed)
Labor Progress Note Kathryn Pena is a 21 y.o. G3P1011 at [redacted]w[redacted]d who presented for IOL due to gHTN.  S: Doing well. Headache improving. No concerns.   O:  BP 119/76   Pulse 84   Temp 98 F (36.7 C) (Oral)   Resp 16   Ht 5\' 8"  (1.727 m)   Wt 117.7 kg   LMP 06/18/2021 (Approximate)   SpO2 100%   BMI 39.44 kg/m   EFM: Baseline 120 bpm, moderate variability, + accels, no decels  Toco: Irregular contractions   CVE: Dilation: Fingertip Effacement (%): Thick Station: -3 Presentation: Vertex Exam by:: [email or web address]  A&P: 21 y.o. 21 [redacted]w[redacted]d   #Labor: Progressing well. Received vaginal Cytotec at 1812. Will plan to recheck 4 hours from dose.  #Pain: PRN; coping well  #FWB: Cat 1  #GBS positive; receiving PCN  #gHTN: Prior headache now improving. PIH labs normal on admission. BP remains within normal range. Will continue to monitor closely.   [redacted]w[redacted]d, MD 9:29 PM

## 2022-04-12 NOTE — Progress Notes (Signed)
Labor Progress Note SHARVI MOONEYHAN is a 21 y.o. G3P1011 at [redacted]w[redacted]d who presented for IOL due to gHTN.   S: Doing well. Has mild headache. No concerns.   O:  BP 123/79 (BP Location: Right Arm)   Pulse 89   Temp 98 F (36.7 C) (Oral)   Resp 16   Ht 5\' 8"  (1.727 m)   Wt 117.7 kg   LMP 06/18/2021 (Approximate)   SpO2 100%   BMI 39.44 kg/m   EFM: Baseline 120 bpm, moderate variability, + accels, no decels  Toco: Irregular contractions   CVE: Dilation: 2 Effacement (%): 50 Cervical Position: Anterior Station: -3 Presentation: Vertex Exam by:: Dr. [email or web address]  A&P: 21 y.o. 21 [redacted]w[redacted]d   #Labor: Progressing well since last exam. Additional dose of vaginal Cytotec 25 mcg placed. Will reassess in 4 hours.  #Pain: PRN; coping well  #FWB: Cat 1  #GBS positive; receiving PCN  #gHTN: Still has mild headache. BP within normal limits. Previously received Tylenol x 1. Will trial dose of Flexeril 10 mg and reassess. Will continue to monitor closely.   [redacted]w[redacted]d, MD 10:57 PM

## 2022-04-12 NOTE — H&P (Signed)
Kathryn Pena is a 21 y.o. female G3P1011 with IUP at [redacted]w[redacted]d by LMP presenting for IOL due to [facility]; patient has had HA for over a week but BPs not consistently elevated; unclear if this is pre-eclampsia w/SF or GHTN.  Pre-e labs were normal on 04/10/2022.   She reports positive fetal movement. She denies leakage of fluid or vaginal bleeding.  Prenatal History/Complications: PNC at [facility] Pregnancy complications:  - Past Medical History: Past Medical History:  Diagnosis Date   Pregnancy induced hypertension     Past Surgical History: Past Surgical History:  Procedure Laterality Date   NO PAST SURGERIES      Obstetrical History: OB History     Gravida  3   Para  1   Term  1   Preterm      AB  1   Living  1      SAB      IAB  1   Ectopic      Multiple  0   Live Births  1            Social History: Social History   Socioeconomic History   Marital status: Single    Spouse name: Not on file   Number of children: Not on file   Years of education: Not on file   Highest education level: Not on file  Occupational History   Not on file  Tobacco Use   Smoking status: Former    Types: Cigars    Quit date: 06/27/2018    Years since quitting: 3.7   Smokeless tobacco: Never  Vaping Use   Vaping Use: Never used  Substance and Sexual Activity   Alcohol use: No   Drug use: No   Sexual activity: Yes  Other Topics Concern   Not on file  Social History Narrative   Not on file   Social Determinants of Health   Financial Resource Strain: Low Risk  (02/03/2019)   Overall Financial Resource Strain (CARDIA)    Difficulty of Paying Living Expenses: Not hard at all  Food Insecurity: No Food Insecurity (04/10/2022)   Hunger Vital Sign    Worried About Running Out of Food in the Last Year: Never true    Ran Out of Food in the Last Year: Never true  Transportation Needs: No Transportation Needs (04/10/2022)   PRAPARE - Administrator, Civil Service  (Medical): No    Lack of Transportation (Non-Medical): No  Physical Activity: Not on file  Stress: No Stress Concern Present (02/03/2019)   [employer] of Occupational Health - Occupational Stress Questionnaire    Feeling of Stress : Only a little  Social Connections: Not on file    Family History: Family History  Problem Relation Age of Onset   Healthy Mother    Hypertension Maternal Aunt    Diabetes Neg Hx    Heart disease Neg Hx    Stroke Neg Hx     Allergies: No Known Allergies  Medications Prior to Admission  Medication Sig Dispense Refill Last Dose   Prenatal Vit-Fe Phos-FA-Omega (VITAFOL GUMMIES) 3.33-0.333-34.8 MG CHEW Chew 1 tablet by mouth daily. 90 tablet 5 04/11/2022   acetaminophen (TYLENOL) 500 MG tablet Take 2 tablets (1,000 mg total) by mouth every 6 (six) hours as needed for moderate pain. 30 tablet 0     Review of Systems   Constitutional: Negative for fever and chills Eyes: Negative for visual disturbances Respiratory: Negative for shortness of breath,  dyspnea Cardiovascular: Negative for chest pain or palpitations  Gastrointestinal: Negative for vomiting, diarrhea and constipation.  POSITIVE for abdominal pain (contractions) Genitourinary: Negative for dysuria and urgency Musculoskeletal: Negative for back pain, joint pain, myalgias  Neurological: Negative for dizziness and headaches  Blood pressure (!) 144/89, pulse (!) 101, temperature 98.1 F (36.7 C), temperature source Oral, resp. rate 16, height 5\' 8"  (1.727 m), weight 117.7 kg, last menstrual period 06/18/2021, SpO2 100 %. General appearance: alert, cooperative, and no distress Lungs: normal respiratory effort Heart: regular rate and rhythm Abdomen: soft, non-tender; bowel sounds normal Extremities: Homans sign is negative, no sign of DVT DTR's 2+ Presentation: cephalic Fetal monitoring   125 mod var, present acel, no decels, Uterine activity  uterine irratability     Prenatal  labs: ABO, Rh: --/--/PENDING (07/01 1701) Antibody: PENDING (07/01 1701) Rubella: 8.28 (01/03 1154) RPR: Non Reactive (04/14 0937)  HBsAg: Negative (01/03 1154)  HIV: Non Reactive (04/14 0937)  GBS:   POS 1 hr Glucola passed Genetic screening  Normal Anatomy 03-03-1993 normal  Prenatal Transfer Tool  Maternal Diabetes: No Genetic Screening: Normal Maternal Ultrasounds/Referrals: Other:MFM for growth and BPP Fetal Ultrasounds or other Referrals:  Referred to Materal Fetal Medicine  Maternal Substance Abuse:  No Significant Maternal Medications:  Meds include: Other:  Significant Maternal Lab Results: Group B Strep positive  Results for orders placed or performed during the hospital encounter of 04/12/22 (from the past 24 hour(s))  CBC   Collection Time: 04/12/22  5:01 PM  Result Value Ref Range   WBC 5.9 4.0 - 10.5 K/uL   RBC 3.81 (L) 3.87 - 5.11 MIL/uL   Hemoglobin 10.1 (L) 12.0 - 15.0 g/dL   HCT 06/13/22 (L) 82.9 - 93.7 %   MCV 81.4 80.0 - 100.0 fL   MCH 26.5 26.0 - 34.0 pg   MCHC 32.6 30.0 - 36.0 g/dL   RDW 16.9 (H) 67.8 - 93.8 %   Platelets 198 150 - 400 K/uL   nRBC 0.0 0.0 - 0.2 %  Comprehensive metabolic panel   Collection Time: 04/12/22  5:01 PM  Result Value Ref Range   Sodium 139 135 - 145 mmol/L   Potassium 4.3 3.5 - 5.1 mmol/L   Chloride 110 98 - 111 mmol/L   CO2 20 (L) 22 - 32 mmol/L   Glucose, Bld 108 (H) 70 - 99 mg/dL   BUN <5 (L) 6 - 20 mg/dL   Creatinine, Ser 06/13/22 0.44 - 1.00 mg/dL   Calcium 7.0 (L) 8.9 - 10.3 mg/dL   Total Protein 5.9 (L) 6.5 - 8.1 g/dL   Albumin 2.7 (L) 3.5 - 5.0 g/dL   AST 15 15 - 41 U/L   ALT 10 0 - 44 U/L   Alkaline Phosphatase 88 38 - 126 U/L   Total Bilirubin 0.8 0.3 - 1.2 mg/dL   GFR, Estimated 7.51 >02 mL/min   Anion gap 9 5 - 15  Type and screen   Collection Time: 04/12/22  5:01 PM  Result Value Ref Range   ABO/RH(D) PENDING    Antibody Screen PENDING    Sample Expiration      04/15/2022,2359 Performed at [facility]  Lab, [address], [place], [place]    Patient Vitals for the past 24 hrs:  BP Temp Temp src Pulse Resp SpO2 Height Weight  04/12/22 1647 (!) 144/89 98.1 F (36.7 C) Oral (!) 101 16 100 % 5\' 8"  (1.727 m) 117.7 kg    Assessment:  Kathryn Pena is a 21 y.o. G3P1011 with an IUP at [redacted]w[redacted]d presenting for IOL for GHTN.  -CMP and CBC normal today, -will collect PCR as well -will start with vaginal cytotec -will collect more blood pressures to consider GHTN vs. Pre-e w/SF Plan: #Labor: expectant management #Pain:  Per request #FWB Cat 1 #ID: GBS: [id number]  #MOF:  breast feeding #MOC: postplacental IUD-consent signed and order placed #Circ: NA   Marylene Land 04/12/2022, 5:54 PM

## 2022-04-13 ENCOUNTER — Encounter (HOSPITAL_COMMUNITY): Payer: Self-pay | Admitting: Obstetrics & Gynecology

## 2022-04-13 ENCOUNTER — Inpatient Hospital Stay (HOSPITAL_COMMUNITY): Payer: Medicaid Other | Admitting: Anesthesiology

## 2022-04-13 DIAGNOSIS — O134 Gestational [pregnancy-induced] hypertension without significant proteinuria, complicating childbirth: Secondary | ICD-10-CM

## 2022-04-13 DIAGNOSIS — Z3A39 39 weeks gestation of pregnancy: Secondary | ICD-10-CM

## 2022-04-13 DIAGNOSIS — O99820 Streptococcus B carrier state complicating pregnancy: Secondary | ICD-10-CM

## 2022-04-13 LAB — CBC
HCT: 29.9 % — ABNORMAL LOW (ref 36.0–46.0)
HCT: 31 % — ABNORMAL LOW (ref 36.0–46.0)
Hemoglobin: 9.7 g/dL — ABNORMAL LOW (ref 12.0–15.0)
Hemoglobin: 10.1 g/dL — ABNORMAL LOW (ref 12.0–15.0)
MCH: 26.8 pg (ref 26.0–34.0)
MCH: 27 pg (ref 26.0–34.0)
MCHC: 32.4 g/dL (ref 30.0–36.0)
MCHC: 32.6 g/dL (ref 30.0–36.0)
MCV: 82.6 fL (ref 80.0–100.0)
MCV: 82.9 fL (ref 80.0–100.0)
Platelets: 215 10*3/uL (ref 150–400)
Platelets: 197 10*3/uL (ref 150–400)
RBC: 3.62 MIL/uL — ABNORMAL LOW (ref 3.87–5.11)
RBC: 3.74 MIL/uL — ABNORMAL LOW (ref 3.87–5.11)
RDW: 20.4 % — ABNORMAL HIGH (ref 11.5–15.5)
RDW: 20.6 % — ABNORMAL HIGH (ref 11.5–15.5)
WBC: 6.4 10*3/uL (ref 4.0–10.5)
WBC: 9.7 10*3/uL (ref 4.0–10.5)
nRBC: 0 % (ref 0.0–0.2)
nRBC: 0 % (ref 0.0–0.2)

## 2022-04-13 LAB — RPR: RPR Ser Ql: NONREACTIVE

## 2022-04-13 MED ORDER — TETANUS-DIPHTH-ACELL PERTUSSIS 5-2.5-18.5 LF-MCG/0.5 IM SUSY: 0.5000 mL | PREFILLED_SYRINGE | Freq: Once | INTRAMUSCULAR | Status: DC

## 2022-04-13 MED ORDER — SIMETHICONE 80 MG PO CHEW: 80.0000 mg | CHEWABLE_TABLET | ORAL | Status: DC | PRN

## 2022-04-13 MED ORDER — OXYCODONE HCL 5 MG PO TABS
5.0000 mg | ORAL_TABLET | ORAL | Status: DC | PRN
  Administered 2022-04-14 (×3): 5 mg via ORAL
  Filled 2022-04-13 (×3): qty 1

## 2022-04-13 MED ORDER — OXYTOCIN-SODIUM CHLORIDE 30-0.9 UT/500ML-% IV SOLN: 2.5000 [IU]/h | INTRAVENOUS | Status: DC | PRN

## 2022-04-13 MED ORDER — COCONUT OIL OIL: 1.0000 | TOPICAL_OIL | Status: DC | PRN

## 2022-04-13 MED ORDER — PHENYLEPHRINE 80 MCG/ML (10ML) SYRINGE FOR IV PUSH (FOR BLOOD PRESSURE SUPPORT): 80.0000 ug | PREFILLED_SYRINGE | INTRAVENOUS | Status: DC | PRN

## 2022-04-13 MED ORDER — SENNOSIDES-DOCUSATE SODIUM 8.6-50 MG PO TABS: 2.0000 | ORAL_TABLET | Freq: Every evening | ORAL | Status: DC | PRN

## 2022-04-13 MED ORDER — EPHEDRINE 5 MG/ML INJ: 10.0000 mg | INTRAVENOUS | Status: DC | PRN

## 2022-04-13 MED ORDER — OXYCODONE HCL 5 MG PO TABS: 10.0000 mg | ORAL_TABLET | ORAL | Status: DC | PRN

## 2022-04-13 MED ORDER — LACTATED RINGERS IV SOLN: 500.0000 mL | Freq: Once | INTRAVENOUS | Status: DC

## 2022-04-13 MED ORDER — DIPHENHYDRAMINE HCL 50 MG/ML IJ SOLN: 12.5000 mg | INTRAMUSCULAR | Status: DC | PRN

## 2022-04-13 MED ORDER — ONDANSETRON HCL 4 MG/2ML IJ SOLN: 4.0000 mg | INTRAMUSCULAR | Status: DC | PRN

## 2022-04-13 MED ORDER — FENTANYL-BUPIVACAINE-NACL 0.5-0.125-0.9 MG/250ML-% EP SOLN
12.0000 mL/h | EPIDURAL | Status: DC | PRN
  Administered 2022-04-13: 12 mL/h via EPIDURAL
  Filled 2022-04-13: qty 250

## 2022-04-13 MED ORDER — CEFAZOLIN SODIUM-DEXTROSE 2-4 GM/100ML-% IV SOLN
2.0000 g | Freq: Three times a day (TID) | INTRAVENOUS | Status: DC
  Administered 2022-04-13: 2 g via INTRAVENOUS

## 2022-04-13 MED ORDER — WITCH HAZEL-GLYCERIN EX PADS: 1.0000 | MEDICATED_PAD | CUTANEOUS | Status: DC | PRN

## 2022-04-13 MED ORDER — DIBUCAINE (PERIANAL) 1 % EX OINT: 1.0000 | TOPICAL_OINTMENT | CUTANEOUS | Status: DC | PRN

## 2022-04-13 MED ORDER — PRENATAL MULTIVITAMIN CH
1.0000 | ORAL_TABLET | Freq: Every day | ORAL | Status: DC
  Administered 2022-04-13 – 2022-04-15 (×3): 1 via ORAL
  Filled 2022-04-13 (×3): qty 1

## 2022-04-13 MED ORDER — ACETAMINOPHEN 325 MG PO TABS
650.0000 mg | ORAL_TABLET | ORAL | Status: DC | PRN
  Administered 2022-04-13 – 2022-04-15 (×5): 650 mg via ORAL
  Filled 2022-04-13 (×5): qty 2

## 2022-04-13 MED ORDER — TERBUTALINE SULFATE 1 MG/ML IJ SOLN
INTRAMUSCULAR | Status: AC
  Filled 2022-04-13: qty 1

## 2022-04-13 MED ORDER — ONDANSETRON HCL 4 MG PO TABS: 4.0000 mg | ORAL_TABLET | ORAL | Status: DC | PRN

## 2022-04-13 MED ORDER — DIPHENHYDRAMINE HCL 25 MG PO CAPS: 25.0000 mg | ORAL_CAPSULE | Freq: Four times a day (QID) | ORAL | Status: DC | PRN

## 2022-04-13 MED ORDER — TRANEXAMIC ACID-NACL 1000-0.7 MG/100ML-% IV SOLN
INTRAVENOUS | Status: AC
  Filled 2022-04-13: qty 100

## 2022-04-13 MED ORDER — POLYETHYLENE GLYCOL 3350 17 G PO PACK
17.0000 g | PACK | Freq: Every day | ORAL | Status: DC
  Administered 2022-04-14 – 2022-04-15 (×2): 17 g via ORAL
  Filled 2022-04-13 (×2): qty 1

## 2022-04-13 MED ORDER — LIDOCAINE HCL (PF) 1 % IJ SOLN
INTRAMUSCULAR | Status: DC | PRN
  Administered 2022-04-13: 8 mL via EPIDURAL

## 2022-04-13 MED ORDER — IBUPROFEN 600 MG PO TABS
600.0000 mg | ORAL_TABLET | Freq: Four times a day (QID) | ORAL | Status: DC
  Administered 2022-04-13 – 2022-04-15 (×9): 600 mg via ORAL
  Filled 2022-04-13 (×9): qty 1

## 2022-04-13 MED ORDER — BENZOCAINE-MENTHOL 20-0.5 % EX AERO
1.0000 | INHALATION_SPRAY | CUTANEOUS | Status: DC | PRN
  Administered 2022-04-13: 1 via TOPICAL
  Filled 2022-04-13: qty 56

## 2022-04-13 NOTE — Lactation Note (Signed)
This note was copied from a baby's chart. Lactation Consultation Note  Patient Name: Kathryn Pena GYIRS'W Date: 04/13/2022 Reason for consult: Initial assessment;Term;Other (Comment) Age:22 hours  LC in to visit with P2 Mom of term baby delivered vaginally.  Delivery complicated with shoulder dystocia for 2 minutes.  NRP by NICU performed and baby's Apgars with 1,4 and 7.   Clavicle Xray pending  Baby noted to be STS on Mom's chest, sleeping.  Baby not showing any feeding cues.    Mom agreeable to try to hand express colostrum to feed to baby.  Reviewed this to Mom.  Gentle pressure produced a wincing response, so LC wasn't able to do much.  The few drops that were expressed from each side, Mom scooped up on her finger and fed to baby.  Baby not sucking on finger.  LC did move baby prone across Mom's chest and assisted with baby latching.  Baby crying and mouth opened wide.  Baby stopped crying and just sat on the breast without sucking.  After about 10 mins, Mom carefully moved baby up to her chest for prone STS.    Mom inquired about pumping.  LC recommended we wait for baby to show feeding cues, and if baby is unable to latch to breast, she should begin to pump and supplement.   Breast shells and hand pump brought into room.  Mom explained that we would help her with this as baby starts showing feeding readiness.  Mom to call for her RN prn for assistance.  Maternal Data Has patient been taught Hand Expression?: Yes Does the patient have breastfeeding experience prior to this delivery?: Yes How long did the patient breastfeed?: 2 weeks  Feeding Mother's Current Feeding Choice: Breast Milk  LATCH Score Latch: Too sleepy or reluctant, no latch achieved, no sucking elicited.  Audible Swallowing: None  Type of Nipple: Flat (compressible areola)  Comfort (Breast/Nipple): Soft / non-tender  Hold (Positioning): Full assist, staff holds infant at breast  LATCH Score:  3   Lactation Tools Discussed/Used Tools: Pump;Shells Breast pump type: Manual Reason for Pumping: Mom may pre-pump  Interventions Interventions: Breast feeding basics reviewed;Assisted with latch;Skin to skin;Breast massage;Hand express;Pre-pump if needed;Adjust position;Support pillows;Position options;Expressed milk;Shells;Hand pump;LC Services brochure  Discharge WIC Program: Yes  Consult Status Consult Status: Follow-up Date: 04/14/22 Follow-up type: In-patient    Judee Clara 04/13/2022, 2:38 PM

## 2022-04-13 NOTE — Progress Notes (Signed)
Labor Progress Note Kathryn Pena is a 21 y.o. G3P1011 at [redacted]w[redacted]d who presented for IOL due to gHTN.  S: Doing well. Much more uncomfortable with contractions. No concerns.   O:  BP 128/89 (BP Location: Right Arm)   Pulse (!) 102   Temp 98.2 F (36.8 C)   Resp 18   Ht 5\' 8"  (1.727 m)   Wt 117.7 kg   LMP 06/18/2021 (Approximate)   SpO2 100%   BMI 39.44 kg/m   EFM: Baseline 135 bpm, moderate variability, + accels, no decels  Toco: Every 1-3 minutes   CVE: Dilation: 3.5 Effacement (%): 60 Cervical Position: Anterior Station: -3 Presentation: Vertex Exam by:: Dr. [email or web address]  A&P: 21 y.o. 21 [redacted]w[redacted]d   #Labor: Progressing well. Contracting every 1-3 minutes and uncomfortable s/p Cytotec x 2. Will hold on additional Cytotec for now. Plan to transition to Pitocin if contractions space. Will continue to monitor and reassess in 4 hours.  #Pain: PRN; coping well  #FWB: Cat 1  #GBS positive; receiving PCN  [redacted]w[redacted]d, MD 3:33 AM

## 2022-04-13 NOTE — Lactation Note (Signed)
This note was copied from a baby's chart. Lactation Consultation Note  Patient Name: Kathryn Pena PZWCH'E Date: 04/13/2022 Reason for consult: Follow-up assessment;Term Age:22 hours   P2 mother whose infant is now 62 hours old.  This is a term baby at 39+3 weeks.  Mother's current feeding preference is breast.  A neonatal code blue was called during this delivery; 2 minute shoulder dystocia and baby was delivered apneic and floppy.  Xray shows a right humerus fracture.  Baby was asleep STS on mother's chest when I arrived.  Offered to assist with latching if desired, however, mother politely declined stating she wanted to let baby rest.  Acknowledged her request.  Suggested mother continue to do lots of STS, breast massage and hand expression.  Mother demonstrated hand expression; revised her technique but she was unable to express drops at this time.  Encouraged to call her RN/LC for latch assistance.  Mother has breast shells (not using at this time) and a manual pump at bedside for nipple eversion.  Suggested she pre-pump prior to latching and feed back any expressed colostrum she obtains.  Mother verbalized understanding.  No support person currently present.   Maternal Data Has patient been taught Hand Expression?: Yes Does the patient have breastfeeding experience prior to this delivery?: Yes How long did the patient breastfeed?: 2 weeks  Feeding Mother's Current Feeding Choice: Breast Milk  LATCH Score                    Lactation Tools Discussed/Used Tools: Shells;Pump;Flanges Flange Size: 24 Breast pump type: Manual Pump Education: Setup, frequency, and cleaning (Reviewed) Reason for Pumping: Nipple eversion Pumping frequency: Prior to feedings and prn  Interventions Interventions: Breast feeding basics reviewed;Education  Discharge Pump: Manual WIC Program: Yes (Mother will contact [facility] office this week)  Consult Status Consult Status:  Follow-up Date: 04/14/22 Follow-up type: In-patient    Kathryn Pena R Kathryn Pena 04/13/2022, 8:30 PM

## 2022-04-13 NOTE — Progress Notes (Signed)
Post Partum Day 1  Subjective: Doing well. No acute events overnight. Pain is controlled and bleeding is appropriate. She is eating, drinking, voiding, and ambulating without issue. She is formula feeding which is going well. She has no other concerns at this time.  Objective: Blood pressure 126/73, pulse 87, temperature 97.9 F (36.6 C), temperature source Oral, resp. rate 18, height 5\' 8"  (1.727 m), weight 117.7 kg, last menstrual period 06/18/2021, SpO2 100 %, unknown if currently breastfeeding.  Physical Exam:  General: alert, cooperative, and no distress Lochia: appropriate Uterine Fundus: firm DVT Evaluation: no LE edema or calf tenderness to palpation   Recent Labs    04/13/22 1100 04/14/22 0523  HGB 10.1* 8.5*  HCT 31.0* 26.7*   Assessment/Plan: Kathryn Pena is a 21 y.o. 21 on PPD# 1 s/p SVD.  Progressing well. Meeting postpartum milestones. VSS. Continue routine postpartum care.  #Acute blood loss anemia: - Hgb 10.1 around 1.5 hours after delivery  - CBC this AM with Hgb 8.5 - Plan for IV Venofer today   #Gestational HTN:  - BP remains within normal limits - Will start Lasix with plan for 5 day course. - Will plan for postpartum Babyscripts and BP check in 1 week   Feeding: Breast  Contraception: IUD outpatient   Dispo: Plan for discharge on PPD#2.    LOS: 2 days   [id number], MD  04/14/2022, 6:48 AM

## 2022-04-13 NOTE — Anesthesia Postprocedure Evaluation (Signed)
Anesthesia Post Note  Patient: Kathryn Pena  Procedure(s) Performed: AN AD HOC LABOR EPIDURAL     Patient location during evaluation: Mother Baby Anesthesia Type: Epidural Level of consciousness: awake Pain management: satisfactory to patient Vital Signs Assessment: post-procedure vital signs reviewed and stable Respiratory status: spontaneous breathing Cardiovascular status: stable Anesthetic complications: no   No notable events documented.  Last Vitals:  Vitals:   04/13/22 1150 04/13/22 1250  BP: 135/87 127/82  Pulse: (!) 101 84  Resp: 18 18  Temp: 36.8 C 36.9 C  SpO2:      Last Pain:  Vitals:   04/13/22 1250  TempSrc: Oral  PainSc:    Pain Goal: Patients Stated Pain Goal: 2 (04/13/22 0500)                 Cephus Shelling

## 2022-04-13 NOTE — Discharge Summary (Signed)
Postpartum Discharge Summary  Date of Service updated***     Patient Name: Kathryn Pena DOB: 2000/10/29 MRN: [id number]  Date of admission: 04/12/2022 Delivery date:04/13/2022  Delivering provider: Renard Matter  Date of discharge: 04/13/2022  Admitting diagnosis: Gestational hypertension [O13.9] Intrauterine pregnancy: [redacted]w[redacted]d    Secondary diagnosis:  Active Problems:   Supervision of other normal pregnancy, antepartum   Group B streptococcal bacteriuria   Gestational hypertension   Shoulder dystocia during labor and delivery  Additional problems: ***None    Discharge diagnosis: Term Pregnancy Delivered and Gestational Hypertension                                              Post partum procedures:*** Augmentation: Cytotec Complications: 90 second shoulder dystocia  Hospital course: Induction of Labor With Vaginal Delivery   21y.o. yo [id number] admitted to the hospital 04/12/2022 for induction of labor.  Indication for induction: Gestational hypertension.  Patient had an uncomplicated labor course as follows: Membrane Rupture Time/Date: 5:41 AM ,04/13/2022   Delivery Method:Vaginal, Spontaneous  Episiotomy:   Lacerations:    Details of delivery can be found in separate delivery note.  Patient had a routine postpartum course. Patient is discharged home 04/13/22.  Newborn Data: Birth date:04/13/2022  Birth time:9:23 AM  Gender:Female  Living status:Living  Apgars:1 ,4 , 7 Weight:3660 g   Magnesium Sulfate received: No BMZ received: No Rhophylac:N/A MMR:N/A T-DaP:Given prenatally Flu: No Transfusion:{Transfusion received:30440034}  Physical exam  Vitals:   04/13/22 1102 04/13/22 1117 04/13/22 1150 04/13/22 1250  BP: 125/83 126/76 135/87 127/82  Pulse: (!) 105 83 (!) 101 84  Resp: '14 14 18 18  ' Temp:   98.3 F (36.8 C) 98.4 F (36.9 C)  TempSrc:   Oral Oral  SpO2:      Weight:      Height:       General: {Exam; general:21111117} Lochia: {Desc;  appropriate/inappropriate:30686::"appropriate"} Uterine Fundus: {Desc; firm/soft:30687} Incision: {Exam; incision:21111123} DVT Evaluation: {Exam; dvt:2111122} Labs: Lab Results  Component Value Date   WBC 9.7 04/13/2022   HGB 10.1 (L) 04/13/2022   HCT 31.0 (L) 04/13/2022   MCV 82.9 04/13/2022   PLT 197 04/13/2022      Latest Ref Rng & Units 04/12/2022    5:01 PM  CMP  Glucose 70 - 99 mg/dL 108   BUN 6 - 20 mg/dL <5   Creatinine 0.44 - 1.00 mg/dL 0.64   Sodium 135 - 145 mmol/L 139   Potassium 3.5 - 5.1 mmol/L 4.3   Chloride 98 - 111 mmol/L 110   CO2 22 - 32 mmol/L 20   Calcium 8.9 - 10.3 mg/dL 7.0   Total Protein 6.5 - 8.1 g/dL 5.9   Total Bilirubin 0.3 - 1.2 mg/dL 0.8   Alkaline Phos 38 - 126 U/L 88   AST 15 - 41 U/L 15   ALT 0 - 44 U/L 10    Edinburgh Score:    22/12/2018    1:18 PM  Edinburgh Postnatal Depression Scale Screening Tool  I have been able to laugh and see the funny side of things. 0  I have looked forward with enjoyment to things. 0  I have blamed myself unnecessarily when things went wrong. 0  I have been anxious or worried for no good reason. 0  I have felt scared or  panicky for no good reason. 0  Things have been getting on top of me. 0  I have been so unhappy that I have had difficulty sleeping. 0  I have felt sad or miserable. 0  I have been so unhappy that I have been crying. 0  The thought of harming myself has occurred to me. 0  Edinburgh Postnatal Depression Scale Total 0     After visit meds:  Allergies as of 04/13/2022   No Known Allergies   Med Rec must be completed prior to using this [facility]***        Discharge home in stable condition Infant Feeding: {Baby feeding:23562} Infant Disposition:{CHL IP OB HOME WITH VHQION:62952} Discharge instruction: per After Visit Summary and Postpartum booklet. Activity: Advance as tolerated. Pelvic rest for 6 weeks.  Diet: {OB WUXL:24401027} Future Appointments: Future Appointments  Date  Time Provider [place]  04/17/2022  2:15 PM [facility] NST [facility] [place]  04/23/2022  2:00 PM CENTERING PROVIDER [facility]   Follow up Visit: Message sent by Dr. Cy Blamer on 7/2   Please schedule this patient for a In person postpartum visit in 4 weeks with the following provider: Any provider. Additional Postpartum F/U:BP check 1 week  High risk pregnancy complicated by: HTN Delivery mode:  Vaginal, Spontaneous  Anticipated Birth Control:   was planning PP IUD. However given bleeding after delivery, shoulder dystocia, and bleeding from laceration, IUD was deferred. Discussed with patient she can get it in office at 6 weeks   04/13/2022 Renard Matter, MD

## 2022-04-13 NOTE — Anesthesia Preprocedure Evaluation (Signed)
Anesthesia Evaluation  Patient identified by MRN, date of birth, ID band Patient awake    Reviewed: Allergy & Precautions, NPO status , Patient's Chart, lab work & pertinent test results  Airway Mallampati: II  TM Distance: >3 FB Neck ROM: Full    Dental no notable dental hx.    Pulmonary former smoker,    Pulmonary exam normal breath sounds clear to auscultation       Cardiovascular hypertension, Normal cardiovascular exam Rhythm:Regular Rate:Normal     Neuro/Psych    GI/Hepatic   Endo/Other  Morbid obesity  Renal/GU      Musculoskeletal   Abdominal   Peds  Hematology  (+) Blood dyscrasia, anemia ,   Anesthesia Other Findings   Reproductive/Obstetrics (+) Pregnancy                             Anesthesia Physical Anesthesia Plan  ASA: 2  Anesthesia Plan: Epidural   Post-op Pain Management:    Induction:   PONV Risk Score and Plan: 2 and Treatment may vary due to age or medical condition  Airway Management Planned: Natural Airway  Additional Equipment:   Intra-op Plan:   Post-operative Plan:   Informed Consent: I have reviewed the patients History and Physical, chart, labs and discussed the procedure including the risks, benefits and alternatives for the proposed anesthesia with the patient or authorized representative who has indicated his/her understanding and acceptance.       Plan Discussed with: Anesthesiologist  Anesthesia Plan Comments:         Anesthesia Quick Evaluation

## 2022-04-13 NOTE — Lactation Note (Addendum)
This note was copied from a baby's chart. Lactation Consultation Note  Patient Name: Kathryn Pena [id number] Date: 04/13/2022 Reason for consult: L&D Initial assessment Age:22 hours  Latch was attempted, but infant was not interested (infant had a shoulder dystocia; NNP has already evaluated infant).   Mom had to use a nipple shield with her 1st child (now 47 yo).   Mom knows lactation will f/u with her later today.  Maternal Data Does the patient have breastfeeding experience prior to this delivery?: Yes How long did the patient breastfeed?: 2 weeks  Feeding Mother's Current Feeding Choice: Breast Milk  LATCH Score Latch: Too sleepy or reluctant, no latch achieved, no sucking elicited.  Audible Swallowing: None  Type of Nipple: Flat (appears short-shafted)  Comfort (Breast/Nipple): Soft / non-tender  Hold (Positioning): Full assist, staff holds infant at breast  LATCH Score: 3   Interventions Interventions: Assisted with latch   Lurline Hare [facility] 04/13/2022, 11:05 AM

## 2022-04-13 NOTE — Anesthesia Procedure Notes (Signed)
Epidural Patient location during procedure: OB Start time: 04/13/2022 5:45 AM End time: 04/13/2022 5:54 AM  Staffing Anesthesiologist: Mellody Dance, MD Performed: anesthesiologist   Preanesthetic Checklist Completed: patient identified, IV checked, site marked, risks and benefits discussed, monitors and equipment checked, pre-op evaluation and timeout performed  Epidural Patient position: sitting Prep: DuraPrep Patient monitoring: heart rate, cardiac monitor, continuous pulse ox and blood pressure Approach: midline Location: L2-L3 Injection technique: LOR saline  Needle:  Needle type: Tuohy  Needle gauge: 17 G Needle length: 9 cm Needle insertion depth: 6 cm Catheter type: closed end flexible Catheter size: 20 Guage Catheter at skin depth: 11 cm Test dose: negative and Other  Assessment Events: blood not aspirated, injection not painful, no injection resistance and negative IV test  Additional Notes Informed consent obtained prior to proceeding including risk of failure, 1% risk of PDPH, risk of minor discomfort and bruising.  Discussed rare but serious complications including epidural abscess, permanent nerve injury, epidural hematoma.  Discussed alternatives to epidural analgesia and patient desires to proceed.  Timeout performed pre-procedure verifying patient name, procedure, and platelet count.  Patient tolerated procedure well.

## 2022-04-14 ENCOUNTER — Other Ambulatory Visit (HOSPITAL_COMMUNITY): Payer: Self-pay

## 2022-04-14 ENCOUNTER — Institutional Professional Consult (permissible substitution): Payer: Self-pay

## 2022-04-14 LAB — CBC
HCT: 26.7 % — ABNORMAL LOW (ref 36.0–46.0)
Hemoglobin: 8.5 g/dL — ABNORMAL LOW (ref 12.0–15.0)
MCH: 26.3 pg (ref 26.0–34.0)
MCHC: 31.8 g/dL (ref 30.0–36.0)
MCV: 82.7 fL (ref 80.0–100.0)
Platelets: 195 10*3/uL (ref 150–400)
RBC: 3.23 MIL/uL — ABNORMAL LOW (ref 3.87–5.11)
RDW: 20.7 % — ABNORMAL HIGH (ref 11.5–15.5)
WBC: 8.2 10*3/uL (ref 4.0–10.5)
nRBC: 0 % (ref 0.0–0.2)

## 2022-04-14 MED ORDER — IBUPROFEN 600 MG PO TABS
600.0000 mg | ORAL_TABLET | Freq: Four times a day (QID) | ORAL | 3 refills | Status: DC | PRN
  Filled 2022-04-14: qty 60, 15d supply, fill #0

## 2022-04-14 MED ORDER — SODIUM CHLORIDE 0.9 % IV SOLN
500.0000 mg | Freq: Once | INTRAVENOUS | Status: AC
  Administered 2022-04-14: 500 mg via INTRAVENOUS
  Filled 2022-04-14: qty 25

## 2022-04-14 MED ORDER — FUROSEMIDE 20 MG PO TABS
20.0000 mg | ORAL_TABLET | Freq: Every day | ORAL | Status: DC
Start: 2022-04-14 — End: 2022-04-15
  Administered 2022-04-14 – 2022-04-15 (×2): 20 mg via ORAL
  Filled 2022-04-14 (×2): qty 1

## 2022-04-14 MED ORDER — FUROSEMIDE 20 MG PO TABS
20.0000 mg | ORAL_TABLET | Freq: Every day | ORAL | 0 refills | Status: DC
  Filled 2022-04-14: qty 4, 4d supply, fill #0

## 2022-04-14 NOTE — Lactation Note (Signed)
This note was copied from a baby's chart. Lactation Consultation Note  Patient Name: Kathryn Pena [id number] Date: 04/14/2022 Reason for consult: Follow-up assessment;Term;Difficult latch Age:21  LC in to visit with P2 Mom of term baby with fractured humerus (see PT note)  Baby is at a 2% weight loss.  Mom started supplementing early on due to fussiness in baby and difficult time latching to the breast.  Mom had success this morning with her RN assisting her.  Mom is still supplementing baby with formula by bottle.   LC recommended and offered to set up a DEBP as baby had been fed recently and was sleeping.    Assisted Mom to pump for first time, 21 mm flanges are the proper fit.  Mom instructed she can call out for latching assistance prn.   Mom currently getting an iron infusion.  Plan- 1-Keep baby STS as much as possible 2- offer the breast with feeding cues 3- If unable to latch, call for help 4- if baby is supplemented with formula, Mom will pump both breasts on initiation setting 5-Ask for help with breastfeeding.  [facility] referral sent.  Mom aware of [facility] loaner program for $30 available if DC'd on holiday or weekend.  Lactation Tools Discussed/Used Tools: Pump;Flanges;Bottle Flange Size: 21 Breast pump type: Double-Electric Breast Pump Pump Education: Setup, frequency, and cleaning;Milk Storage Reason for Pumping: Support milk supply/infant supplementing Pumping frequency: Encouraged Mom to pump after each breastfeeding if baby is being supplemented  Interventions Interventions: Breast feeding basics reviewed;Skin to skin;Breast massage;Hand express;DEBP;Hand pump;Pace feeding  Discharge WIC Program: Yes  Consult Status Consult Status: Follow-up Date: 04/15/22 Follow-up type: In-patient    Judee Clara 04/14/2022, 1:06 PM

## 2022-04-15 MED ORDER — NIFEDIPINE ER OSMOTIC RELEASE 30 MG PO TB24
30.0000 mg | ORAL_TABLET | Freq: Every day | ORAL | Status: DC
  Administered 2022-04-15: 30 mg via ORAL
  Filled 2022-04-15: qty 1

## 2022-04-15 MED ORDER — NIFEDIPINE ER 30 MG PO TB24: 30.0000 mg | ORAL_TABLET | Freq: Every day | ORAL | 1 refills | Status: DC

## 2022-04-15 MED ORDER — NIFEDIPINE ER OSMOTIC RELEASE 30 MG PO TB24
30.0000 mg | ORAL_TABLET | Freq: Every day | ORAL | Status: DC
Start: 2022-04-15 — End: 2022-04-15

## 2022-04-15 MED ORDER — IBUPROFEN 600 MG PO TABS: 600.0000 mg | ORAL_TABLET | Freq: Four times a day (QID) | ORAL | 0 refills | Status: DC

## 2022-04-15 NOTE — Lactation Note (Signed)
This note was copied from a baby's chart. Lactation Consultation Note  Patient Name: Kathryn Pena [id number] Date: 04/15/2022   Age:22 hours   LC Note:  Followed up with mother prior to discharge.  Provided engorgement guidelines for mother to follow at home.  Breasts are still full, however, mother has made some progress with milk extraction.  Reviewed process again and encouraged diligence and continued use of ice packs.  Mother appreciative.  She has our OP phone number for any concerns after discharge.  Father present.   Maternal Data    Feeding Nipple Type: Slow - flow  LATCH Score                    Lactation Tools Discussed/Used    Interventions    Discharge    Consult Status      Kathryn Pena 04/15/2022, 2:35 PM

## 2022-04-15 NOTE — Lactation Note (Signed)
This note was copied from a baby's chart. Lactation Consultation Note  Patient Name: Kathryn Pena IRWER'X Date: 04/15/2022 Reason for consult: Follow-up assessment Age:21 hours   Lactation Follow Up Consult:  RN requested a lactation consult: full breasts  Mother's breasts are filling; left breast twice the size of the right.  No engorgement yet.  Arrived to find mother beginning to pump with the DEBP.  Mother has been breast feeding some on the right breast and has not been breast feeding on the left breast due to baby's right humerus fracture and projected sensitivity.  Discussed the importance of breast stimulation to both breasts every three hours to help ensure a good milk supply and prevent engorgement.  She was set up yesterday with the DEBP, however, has only used it a couple of times.  She prefers the manual pump.  Observed her pumping for 15 minutes and massaged breasts during pumping; few colostrum drops obtained. Ice packs placed and mother laying flat for 15 minutes.  Discussed plan for today in regards to feeding/pumping.  RN to assist with latching on the left breast at the next feeding.    Mother will be picking up a [facility] DEBP tomorrow morning since they are closed today for the July 4 holiday.  She is aware of the [facility] loaner program here and knows this is also an option if she desires.  Father present.   Maternal Data    Feeding    LATCH Score                    Lactation Tools Discussed/Used    Interventions Interventions: Education  Discharge Discharge Education: Engorgement and breast care WIC Program: Yes  Consult Status Consult Status: Follow-up Date: 04/15/22 Follow-up type: In-patient    Kathryn Pena 04/15/2022, 5:00 AM

## 2022-04-17 ENCOUNTER — Other Ambulatory Visit: Payer: Medicaid Other

## 2022-04-21 ENCOUNTER — Ambulatory Visit (INDEPENDENT_AMBULATORY_CARE_PROVIDER_SITE_OTHER): Payer: Medicaid Other

## 2022-04-21 ENCOUNTER — Telehealth: Payer: Self-pay

## 2022-04-21 DIAGNOSIS — Z0130 Encounter for examination of blood pressure without abnormal findings: Secondary | ICD-10-CM

## 2022-04-21 NOTE — Progress Notes (Signed)
Subjective:  Kathryn Pena is a 21 y.o. female here for BP check.   Hypertension ROS: taking medications as instructed, no medication side effects noted, no TIA's, no chest pain on exertion, no dyspnea on exertion, and no swelling of ankles.    Objective:  BP (!) 144/95 (BP Location: Left Arm, Cuff Size: Large)   Pulse 71   Ht 5\' 8"  (1.727 m)   Wt 237 lb (107.5 kg)   LMP 06/18/2021 (Approximate)   Breastfeeding Yes   BMI 36.04 kg/m   Appearance alert, well appearing, and in no distress. General exam BP noted to be well controlled today in office.    Assessment:   Blood Pressure stable.   Plan: Per Dr. 08/18/2021 Current treatment plan is effective, no change in therapy.  Continue to take medication as directed. Please keep your regard PP appointment.

## 2022-04-21 NOTE — Telephone Encounter (Signed)
  Written orders for Breast Pump signed and faxed to [facility] on 04/21/22

## 2022-05-06 ENCOUNTER — Encounter: Payer: Self-pay | Admitting: Obstetrics and Gynecology

## 2022-05-06 NOTE — Progress Notes (Signed)
Patient was assessed and managed by nursing staff during this encounter. I have reviewed the chart and agree with the documentation and plan. I have also made any necessary editorial changes.  Scheryl Darter, MD 05/06/2022 2:57 PM

## 2022-05-08 ENCOUNTER — Encounter: Payer: Self-pay | Admitting: *Deleted

## 2022-05-10 ENCOUNTER — Inpatient Hospital Stay (HOSPITAL_COMMUNITY)
Admission: EM | Admit: 2022-05-10 | Discharge: 2022-05-18 | DRG: 776 | Disposition: A | Discharge status: Home / Self Care | Payer: Medicaid Other | Attending: Internal Medicine | Admitting: Internal Medicine

## 2022-05-10 ENCOUNTER — Emergency Department (HOSPITAL_COMMUNITY): Payer: Medicaid Other

## 2022-05-10 ENCOUNTER — Other Ambulatory Visit: Payer: Self-pay

## 2022-05-10 ENCOUNTER — Encounter (HOSPITAL_COMMUNITY): Payer: Self-pay | Admitting: Emergency Medicine

## 2022-05-10 DIAGNOSIS — Z8249 Family history of ischemic heart disease and other diseases of the circulatory system: Secondary | ICD-10-CM

## 2022-05-10 DIAGNOSIS — Z87440 Personal history of urinary (tract) infections: Secondary | ICD-10-CM

## 2022-05-10 DIAGNOSIS — I5021 Acute systolic (congestive) heart failure: Secondary | ICD-10-CM | POA: Diagnosis present

## 2022-05-10 DIAGNOSIS — E872 Acidosis, unspecified: Secondary | ICD-10-CM | POA: Diagnosis present

## 2022-05-10 DIAGNOSIS — K5900 Constipation, unspecified: Secondary | ICD-10-CM | POA: Diagnosis not present

## 2022-05-10 DIAGNOSIS — E876 Hypokalemia: Secondary | ICD-10-CM | POA: Diagnosis present

## 2022-05-10 DIAGNOSIS — F17210 Nicotine dependence, cigarettes, uncomplicated: Secondary | ICD-10-CM | POA: Diagnosis present

## 2022-05-10 DIAGNOSIS — R162 Hepatomegaly with splenomegaly, not elsewhere classified: Secondary | ICD-10-CM | POA: Diagnosis present

## 2022-05-10 DIAGNOSIS — D509 Iron deficiency anemia, unspecified: Secondary | ICD-10-CM | POA: Diagnosis present

## 2022-05-10 DIAGNOSIS — R6521 Severe sepsis with septic shock: Secondary | ICD-10-CM | POA: Diagnosis present

## 2022-05-10 DIAGNOSIS — I5023 Acute on chronic systolic (congestive) heart failure: Secondary | ICD-10-CM | POA: Diagnosis present

## 2022-05-10 DIAGNOSIS — Z79899 Other long term (current) drug therapy: Secondary | ICD-10-CM

## 2022-05-10 DIAGNOSIS — G9341 Metabolic encephalopathy: Secondary | ICD-10-CM | POA: Diagnosis present

## 2022-05-10 DIAGNOSIS — B9620 Unspecified Escherichia coli [E. coli] as the cause of diseases classified elsewhere: Secondary | ICD-10-CM | POA: Diagnosis present

## 2022-05-10 DIAGNOSIS — N12 Tubulo-interstitial nephritis, not specified as acute or chronic: Principal | ICD-10-CM | POA: Diagnosis present

## 2022-05-10 DIAGNOSIS — N136 Pyonephrosis: Secondary | ICD-10-CM | POA: Diagnosis present

## 2022-05-10 DIAGNOSIS — E669 Obesity, unspecified: Secondary | ICD-10-CM | POA: Diagnosis present

## 2022-05-10 DIAGNOSIS — Z6834 Body mass index (BMI) 34.0-34.9, adult: Secondary | ICD-10-CM

## 2022-05-10 DIAGNOSIS — R7402 Elevation of levels of lactic acid dehydrogenase (LDH): Secondary | ICD-10-CM | POA: Diagnosis present

## 2022-05-10 DIAGNOSIS — O99335 Smoking (tobacco) complicating the puerperium: Secondary | ICD-10-CM | POA: Diagnosis present

## 2022-05-10 DIAGNOSIS — O8621 Infection of kidney following delivery: Principal | ICD-10-CM | POA: Diagnosis present

## 2022-05-10 DIAGNOSIS — A4151 Sepsis due to Escherichia coli [E. coli]: Principal | ICD-10-CM | POA: Diagnosis present

## 2022-05-10 DIAGNOSIS — O903 Peripartum cardiomyopathy: Secondary | ICD-10-CM | POA: Diagnosis present

## 2022-05-10 DIAGNOSIS — N170 Acute kidney failure with tubular necrosis: Secondary | ICD-10-CM | POA: Diagnosis present

## 2022-05-10 DIAGNOSIS — N179 Acute kidney failure, unspecified: Secondary | ICD-10-CM | POA: Diagnosis present

## 2022-05-10 DIAGNOSIS — I5022 Chronic systolic (congestive) heart failure: Secondary | ICD-10-CM

## 2022-05-10 LAB — COMPREHENSIVE METABOLIC PANEL
ALT: 17 U/L (ref 0–44)
AST: 18 U/L (ref 15–41)
Albumin: 3.3 g/dL — ABNORMAL LOW (ref 3.5–5.0)
Alkaline Phosphatase: 68 U/L (ref 38–126)
Anion gap: 8 (ref 5–15)
BUN: 12 mg/dL (ref 6–20)
CO2: 23 mmol/L (ref 22–32)
Calcium: 8.6 mg/dL — ABNORMAL LOW (ref 8.9–10.3)
Chloride: 108 mmol/L (ref 98–111)
Creatinine, Ser: 1.25 mg/dL — ABNORMAL HIGH (ref 0.44–1.00)
GFR, Estimated: >60 mL/min (ref >60)
Glucose, Bld: 101 mg/dL — ABNORMAL HIGH (ref 70–99)
Potassium: 3.5 mmol/L (ref 3.5–5.1)
Sodium: 139 mmol/L (ref 135–145)
Total Bilirubin: 1.2 mg/dL (ref 0.3–1.2)
Total Protein: 6.5 g/dL (ref 6.5–8.1)

## 2022-05-10 LAB — APTT: aPTT: 32 seconds (ref 24–36)

## 2022-05-10 LAB — CBC WITH DIFFERENTIAL/PLATELET
Abs Immature Granulocytes: 0 10*3/uL (ref 0.00–0.07)
Basophils Absolute: 0 10*3/uL (ref 0.0–0.1)
Basophils Relative: 0 %
Eosinophils Absolute: 0 10*3/uL (ref 0.0–0.5)
Eosinophils Relative: 0 %
HCT: 35.6 % — ABNORMAL LOW (ref 36.0–46.0)
Hemoglobin: 11.3 g/dL — ABNORMAL LOW (ref 12.0–15.0)
Lymphocytes Relative: 4 %
Lymphs Abs: 0.7 10*3/uL (ref 0.7–4.0)
MCH: 27.2 pg (ref 26.0–34.0)
MCHC: 31.7 g/dL (ref 30.0–36.0)
MCV: 85.8 fL (ref 80.0–100.0)
Monocytes Absolute: 0.7 10*3/uL (ref 0.1–1.0)
Monocytes Relative: 4 %
Neutro Abs: 16.4 10*3/uL — ABNORMAL HIGH (ref 1.7–7.7)
Neutrophils Relative %: 92 %
Platelets: 232 10*3/uL (ref 150–400)
RBC: 4.15 MIL/uL (ref 3.87–5.11)
RDW: 22.4 % — ABNORMAL HIGH (ref 11.5–15.5)
WBC: 17.8 10*3/uL — ABNORMAL HIGH (ref 4.0–10.5)
nRBC: 0 % (ref 0.0–0.2)
nRBC: 0 /100 WBC

## 2022-05-10 LAB — PROTIME-INR
INR: 1.1 (ref 0.8–1.2)
Prothrombin Time: 14.5 seconds (ref 11.4–15.2)

## 2022-05-10 LAB — I-STAT BETA HCG BLOOD, ED (MC, WL, AP ONLY): I-stat hCG, quantitative: 11.8 m[IU]/mL — ABNORMAL HIGH (ref <5)

## 2022-05-10 LAB — SEDIMENTATION RATE: Sed Rate: 28 mm/hr — ABNORMAL HIGH (ref 0–22)

## 2022-05-10 LAB — LACTIC ACID, PLASMA: Lactic Acid, Venous: 1.2 mmol/L (ref 0.5–1.9)

## 2022-05-10 MED ORDER — MORPHINE SULFATE (PF) 4 MG/ML IV SOLN
4.0000 mg | Freq: Once | INTRAVENOUS | Status: AC
  Administered 2022-05-10: 4 mg via INTRAVENOUS
  Filled 2022-05-10: qty 1

## 2022-05-10 MED ORDER — HYDROMORPHONE HCL 1 MG/ML IJ SOLN
0.5000 mg | Freq: Once | INTRAMUSCULAR | Status: AC
  Administered 2022-05-10: 0.5 mg via INTRAVENOUS
  Filled 2022-05-10: qty 1

## 2022-05-10 MED ORDER — LACTATED RINGERS IV BOLUS
1000.0000 mL | Freq: Once | INTRAVENOUS | Status: AC
  Administered 2022-05-10: 1000 mL via INTRAVENOUS

## 2022-05-10 MED ORDER — ACETAMINOPHEN 325 MG PO TABS
650.0000 mg | ORAL_TABLET | ORAL | Status: DC | PRN
  Administered 2022-05-10 – 2022-05-11 (×5): 650 mg via ORAL
  Filled 2022-05-10 (×5): qty 2

## 2022-05-10 NOTE — ED Triage Notes (Signed)
Pt BIB [facility] from home, pt 88mos post partum, c/o pain at epidural site. Pain began yesterday, has tried OTC meds with no relief. EMS BP: 178/109, HR 130. Given 4mg  zofran and fentanyl pta.

## 2022-05-10 NOTE — ED Provider Notes (Signed)
Didn't know she had a fever Back pain today.   Physical Exam  BP 131/79   Pulse (!) 124   Temp (!) 101.8 F (38.8 C) (Oral)   Resp 20   LMP 06/18/2021 (Approximate)   SpO2 100%   Breastfeeding Yes   Physical Exam  Procedures  Procedures  ED Course / MDM    Medical Decision Making Amount and/or Complexity of Data Reviewed Labs: ordered. Radiology: ordered. ECG/medicine tests: ordered.  Risk OTC drugs. Prescription drug management.   ***

## 2022-05-10 NOTE — ED Provider Notes (Signed)
[facility] EMERGENCY DEPARTMENT Provider Note   CSN: [id number] Arrival date & time: 05/10/22  1930     History  Chief Complaint  Patient presents with   Back Pain    Kathryn Pena is a 21 y.o. female.   Back Pain  21 year old female presents emergency department with complaints of back pain.  She states that symptoms began when she was walking with her mother yesterday.  She denies any falls or traumas to her back.  She recently had an epidural on 04/13/2022 before giving birth to her son.  She has no pain from the site or drainage noted.  She had no complication from the epidural but her back pain started yesterday.  She tried to take at home Tylenol/ibuprofen but has not helped.  She describes the pain as sharp and stabbing in nature and is worse with any kind of movement.  She denies saddle anesthesia, weakness/sensory deficits in lower extremities, known fever at home, history of IV drug use, bowel/bladder dysfunction.  Denies chest pain, shortness of breath, abdominal pain, nausea/vomiting/diarrhea, urinary/vaginal symptoms, change in bowel habits.  Home Medications Prior to Admission medications   Medication Sig Start Date End Date Taking? Authorizing Provider  acetaminophen (TYLENOL) 500 MG tablet Take 2 tablets (1,000 mg total) by mouth every 6 (six) hours as needed for moderate pain. 09/10/21   Donette Larry, CNM  furosemide (LASIX) 20 MG tablet Take 1 tablet (20 mg total) by mouth daily for 4 days. 04/14/22 04/18/22  Carlynn Herald, CNM  ibuprofen (ADVIL) 600 MG tablet Take 1 tablet (600 mg total) by mouth every 6 (six) hours as needed. 04/14/22   Carlynn Herald, CNM  ibuprofen (ADVIL) 600 MG tablet Take 1 tablet (600 mg total) by mouth every 6 (six) hours. 04/15/22   Levie Heritage, DO  NIFEdipine (ADALAT CC) 30 MG 24 hr tablet Take 1 tablet (30 mg total) by mouth daily. 04/16/22   Levie Heritage, DO  Prenatal Vit-Fe Phos-FA-Omega (VITAFOL  GUMMIES) 3.33-0.333-34.8 MG CHEW Chew 1 tablet by mouth daily. 09/02/21   Constant, Peggy, MD  enalapril (VASOTEC) 5 MG tablet Take 1 tablet (5 mg total) by mouth daily. 02/24/19 12/21/19  Hermina Staggers, MD      Allergies    Patient has no known allergies.    Review of Systems   Review of Systems  Musculoskeletal:  Positive for back pain.    Physical Exam Updated Vital Signs BP (!) 143/93 (BP Location: Left Arm)   Pulse (!) 118   Temp (!) 101.8 F (38.8 C) (Oral)   Resp 18   LMP 06/18/2021 (Approximate)   SpO2 100%   Breastfeeding Yes  Physical Exam Vitals and nursing note reviewed.  Constitutional:      General: She is not in acute distress.    Appearance: She is well-developed. She is ill-appearing.     Comments: Patient appears in obvious pain.  She is sitting uncomfortably in pain and is exacerbated with any slight movement.  HENT:     Head: Normocephalic and atraumatic.  Eyes:     Conjunctiva/sclera: Conjunctivae normal.  Cardiovascular:     Rate and Rhythm: Normal rate and regular rhythm.     Heart sounds: No murmur heard. Pulmonary:     Effort: Pulmonary effort is normal. No respiratory distress.     Breath sounds: Normal breath sounds.  Abdominal:     Palpations: Abdomen is soft.     Tenderness: There is  no abdominal tenderness. There is no guarding.  Musculoskeletal:        General: No swelling.     Cervical back: Neck supple.     Right lower leg: No edema.     Left lower leg: No edema.     Comments: Patient has midline tenderness to palpation of mid lumbar spine.  No overlying skin abnormalities noted including erythema, fluctuance, induration.  No paraspinal tenderness on exam.  Patient   Skin:    General: Skin is warm and dry.     Capillary Refill: Capillary refill takes less than 2 seconds.  Neurological:     Mental Status: She is alert.  Psychiatric:        Mood and Affect: Mood normal.    ED Results / Procedures / Treatments   Labs (all labs  ordered are listed, but only abnormal results are displayed) Labs Reviewed - No data to display  EKG None  Radiology No results found.  Procedures Procedures    Medications Ordered in ED Medications - No data to display  ED Course/ Medical Decision Making/ A&P                           Medical Decision Making Amount and/or Complexity of Data Reviewed Labs: ordered. Radiology: ordered. ECG/medicine tests: ordered.  Risk OTC drugs. Prescription drug management.   This patient presents to the ED for concern of back pain, this involves an extensive number of treatment options, and is a complaint that carries with it a high risk of complications and morbidity.  The differential diagnosis includes The emergent differential diagnosis for back pain includes but is not limited to fracture, muscle strain, cauda equina, spinal stenosis. DDD, ankylosing spondylitis, acute ligamentous injury, disk herniation, spondylolisthesis, Epidural compression syndrome, metastatic cancer, transverse myelitis, vertebral osteomyelitis, diskitis, kidney stone, pyelonephritis, AAA, Perforated ulcer, Retrocecal appendicitis, pancreatitis, bowel obstruction, retroperitoneal hemorrhage or mass, meningitis.   Co morbidities that complicate the patient evaluation  Pregnancy-induced hypertension   Additional history obtained:  Additional history obtained from EMR External records from outside source obtained and reviewed including prior operative notes indicating epidural placed during labor  Lab Tests:  I Ordered, and personally interpreted labs.  The pertinent results include: Leukocytosis with a white blood cell count of 17.8.  Mild anemia with hemoglobin 11.3.  No electrolyte abnormalities noted.  Sed rate elevated 28.  Beta-hCG 11.8 downtrending.    Imaging Studies ordered:  I ordered imaging studies including  I independently visualized and interpreted imaging which showed MRI of cervical,  thoracic, lumbar spine pending upon shift change I agree with the radiologist interpretation   Cardiac Monitoring: / EKG:  The patient was maintained on a cardiac monitor.  I personally viewed and interpreted the cardiac monitored which showed an underlying rhythm of: Sinus rhythm   Consultations Obtained:  N/a   Problem List / ED Course / Critical interventions / Medication management  Back pain I ordered medication including morphine and Dilaudid for pain.  Acetaminophen for fever  Reevaluation of the patient after these medicines showed that the patient improved I have reviewed the patients home medicines and have made adjustments as needed   Social Determinants of Health:  Former cigarette use.  Denies illicit drug use.   Test / Admission - Considered:  Back pain Vitals signs significant for low with initial temp of 102 and tachycardic with a rate consistently greater than 120.  Patient with septic vital signs  upon presentation.. Otherwise within normal range and stable throughout visit. Laboratory/imaging studies significant for: See above Patient meets sepsis criteria.  IV antibiotics held at this time pending imaging studies to confirm source of infection.  I suspect infection complication secondary to epidural placement from 04/13/22 given nature of patient's complaints as well as physical exam findings.  MRI imaging of patient's lumbar, thoracic, cervical spine pending upon shift change.  I suspect the patient to be admitted.  Patient care handed off to [facility] PA-C at shift change.  Patient stable on shift change.        Final Clinical Impression(s) / ED Diagnoses Final diagnoses:  None    Rx / DC Orders ED Discharge Orders     None         Peter Garter, [place] 05/10/22 2351    Lonell Grandchild, MD 05/11/22 [phone]

## 2022-05-11 ENCOUNTER — Inpatient Hospital Stay (HOSPITAL_COMMUNITY): Payer: Medicaid Other

## 2022-05-11 DIAGNOSIS — R7402 Elevation of levels of lactic acid dehydrogenase (LDH): Secondary | ICD-10-CM | POA: Diagnosis present

## 2022-05-11 DIAGNOSIS — N179 Acute kidney failure, unspecified: Secondary | ICD-10-CM | POA: Diagnosis not present

## 2022-05-11 DIAGNOSIS — Z79899 Other long term (current) drug therapy: Secondary | ICD-10-CM | POA: Diagnosis not present

## 2022-05-11 DIAGNOSIS — R6521 Severe sepsis with septic shock: Secondary | ICD-10-CM | POA: Diagnosis present

## 2022-05-11 DIAGNOSIS — Z8249 Family history of ischemic heart disease and other diseases of the circulatory system: Secondary | ICD-10-CM | POA: Diagnosis not present

## 2022-05-11 DIAGNOSIS — M545 Low back pain, unspecified: Secondary | ICD-10-CM | POA: Diagnosis present

## 2022-05-11 DIAGNOSIS — O8621 Infection of kidney following delivery: Secondary | ICD-10-CM | POA: Diagnosis present

## 2022-05-11 DIAGNOSIS — I5022 Chronic systolic (congestive) heart failure: Secondary | ICD-10-CM | POA: Diagnosis not present

## 2022-05-11 DIAGNOSIS — F17210 Nicotine dependence, cigarettes, uncomplicated: Secondary | ICD-10-CM | POA: Diagnosis present

## 2022-05-11 DIAGNOSIS — Z6834 Body mass index (BMI) 34.0-34.9, adult: Secondary | ICD-10-CM | POA: Diagnosis not present

## 2022-05-11 DIAGNOSIS — A419 Sepsis, unspecified organism: Secondary | ICD-10-CM | POA: Diagnosis not present

## 2022-05-11 DIAGNOSIS — R609 Edema, unspecified: Secondary | ICD-10-CM | POA: Diagnosis not present

## 2022-05-11 DIAGNOSIS — R008 Other abnormalities of heart beat: Secondary | ICD-10-CM | POA: Diagnosis not present

## 2022-05-11 DIAGNOSIS — O903 Peripartum cardiomyopathy: Secondary | ICD-10-CM | POA: Diagnosis present

## 2022-05-11 DIAGNOSIS — D509 Iron deficiency anemia, unspecified: Secondary | ICD-10-CM | POA: Diagnosis present

## 2022-05-11 DIAGNOSIS — E876 Hypokalemia: Secondary | ICD-10-CM | POA: Diagnosis present

## 2022-05-11 DIAGNOSIS — R0609 Other forms of dyspnea: Secondary | ICD-10-CM | POA: Diagnosis not present

## 2022-05-11 DIAGNOSIS — N12 Tubulo-interstitial nephritis, not specified as acute or chronic: Secondary | ICD-10-CM | POA: Diagnosis not present

## 2022-05-11 DIAGNOSIS — K5900 Constipation, unspecified: Secondary | ICD-10-CM | POA: Diagnosis not present

## 2022-05-11 DIAGNOSIS — I5021 Acute systolic (congestive) heart failure: Secondary | ICD-10-CM | POA: Diagnosis present

## 2022-05-11 DIAGNOSIS — Z87440 Personal history of urinary (tract) infections: Secondary | ICD-10-CM | POA: Diagnosis not present

## 2022-05-11 DIAGNOSIS — R162 Hepatomegaly with splenomegaly, not elsewhere classified: Secondary | ICD-10-CM | POA: Diagnosis present

## 2022-05-11 DIAGNOSIS — G9341 Metabolic encephalopathy: Secondary | ICD-10-CM | POA: Diagnosis present

## 2022-05-11 DIAGNOSIS — D508 Other iron deficiency anemias: Secondary | ICD-10-CM | POA: Diagnosis not present

## 2022-05-11 DIAGNOSIS — N136 Pyonephrosis: Secondary | ICD-10-CM | POA: Diagnosis present

## 2022-05-11 DIAGNOSIS — B9620 Unspecified Escherichia coli [E. coli] as the cause of diseases classified elsewhere: Secondary | ICD-10-CM | POA: Diagnosis present

## 2022-05-11 DIAGNOSIS — E872 Acidosis, unspecified: Secondary | ICD-10-CM | POA: Diagnosis present

## 2022-05-11 DIAGNOSIS — R7881 Bacteremia: Secondary | ICD-10-CM | POA: Diagnosis not present

## 2022-05-11 DIAGNOSIS — A4151 Sepsis due to Escherichia coli [E. coli]: Secondary | ICD-10-CM | POA: Diagnosis present

## 2022-05-11 DIAGNOSIS — N170 Acute kidney failure with tubular necrosis: Secondary | ICD-10-CM | POA: Diagnosis present

## 2022-05-11 DIAGNOSIS — E669 Obesity, unspecified: Secondary | ICD-10-CM | POA: Diagnosis present

## 2022-05-11 DIAGNOSIS — R6520 Severe sepsis without septic shock: Secondary | ICD-10-CM | POA: Diagnosis not present

## 2022-05-11 DIAGNOSIS — B9629 Other Escherichia coli [E. coli] as the cause of diseases classified elsewhere: Secondary | ICD-10-CM | POA: Diagnosis not present

## 2022-05-11 DIAGNOSIS — O99335 Smoking (tobacco) complicating the puerperium: Secondary | ICD-10-CM | POA: Diagnosis present

## 2022-05-11 LAB — COMPREHENSIVE METABOLIC PANEL
ALT: 17 U/L (ref 0–44)
AST: 19 U/L (ref 15–41)
Albumin: 2.8 g/dL — ABNORMAL LOW (ref 3.5–5.0)
Alkaline Phosphatase: 62 U/L (ref 38–126)
Anion gap: 8 (ref 5–15)
BUN: 15 mg/dL (ref 6–20)
CO2: 23 mmol/L (ref 22–32)
Calcium: 8.2 mg/dL — ABNORMAL LOW (ref 8.9–10.3)
Chloride: 105 mmol/L (ref 98–111)
Creatinine, Ser: 1.9 mg/dL — ABNORMAL HIGH (ref 0.44–1.00)
GFR, Estimated: 38 mL/min — ABNORMAL LOW (ref >60)
Glucose, Bld: 137 mg/dL — ABNORMAL HIGH (ref 70–99)
Potassium: 3.9 mmol/L (ref 3.5–5.1)
Sodium: 136 mmol/L (ref 135–145)
Total Bilirubin: 0.6 mg/dL (ref 0.3–1.2)
Total Protein: 6 g/dL — ABNORMAL LOW (ref 6.5–8.1)

## 2022-05-11 LAB — URINALYSIS, ROUTINE W REFLEX MICROSCOPIC
Bacteria, UA: NONE SEEN
Bilirubin Urine: NEGATIVE
Glucose, UA: NEGATIVE mg/dL
Ketones, ur: NEGATIVE mg/dL
Nitrite: NEGATIVE
Protein, ur: >=300 mg/dL — AB
Specific Gravity, Urine: 1.013 (ref 1.005–1.030)
WBC, UA: >50 WBC/hpf — ABNORMAL HIGH (ref 0–5)
pH: 5 (ref 5.0–8.0)

## 2022-05-11 LAB — BLOOD CULTURE ID PANEL (REFLEXED) - BCID2

## 2022-05-11 LAB — TSH: TSH: 2.42 u[IU]/mL (ref 0.350–4.500)

## 2022-05-11 LAB — CBC WITH DIFFERENTIAL/PLATELET
Abs Immature Granulocytes: 0.14 10*3/uL — ABNORMAL HIGH (ref 0.00–0.07)
Basophils Absolute: 0 10*3/uL (ref 0.0–0.1)
Basophils Relative: 0 %
Eosinophils Absolute: 0 10*3/uL (ref 0.0–0.5)
Eosinophils Relative: 0 %
HCT: 33.2 % — ABNORMAL LOW (ref 36.0–46.0)
Hemoglobin: 10.8 g/dL — ABNORMAL LOW (ref 12.0–15.0)
Immature Granulocytes: 1 %
Lymphocytes Relative: 3 %
Lymphs Abs: 0.5 10*3/uL — ABNORMAL LOW (ref 0.7–4.0)
MCH: 27.5 pg (ref 26.0–34.0)
MCHC: 32.5 g/dL (ref 30.0–36.0)
MCV: 84.5 fL (ref 80.0–100.0)
Monocytes Absolute: 0.2 10*3/uL (ref 0.1–1.0)
Monocytes Relative: 2 %
Neutro Abs: 14.7 10*3/uL — ABNORMAL HIGH (ref 1.7–7.7)
Neutrophils Relative %: 94 %
Platelets: 206 10*3/uL (ref 150–400)
RBC: 3.93 MIL/uL (ref 3.87–5.11)
RDW: 22.4 % — ABNORMAL HIGH (ref 11.5–15.5)
Smear Review: NORMAL
WBC: 15.7 10*3/uL — ABNORMAL HIGH (ref 4.0–10.5)
nRBC: 0 % (ref 0.0–0.2)

## 2022-05-11 LAB — GLUCOSE, CAPILLARY: Glucose-Capillary: 100 mg/dL — ABNORMAL HIGH (ref 70–99)

## 2022-05-11 LAB — ECHOCARDIOGRAM COMPLETE
Calc EF: 33.6 %
S' Lateral: 4.3 cm
Single Plane A2C EF: 32.5 %
Single Plane A4C EF: 35 %

## 2022-05-11 LAB — MRSA NEXT GEN BY PCR, NASAL: MRSA by PCR Next Gen: NOT DETECTED

## 2022-05-11 LAB — BASIC METABOLIC PANEL
Anion gap: 11 (ref 5–15)
BUN: 19 mg/dL (ref 6–20)
CO2: 21 mmol/L — ABNORMAL LOW (ref 22–32)
Calcium: 8 mg/dL — ABNORMAL LOW (ref 8.9–10.3)
Chloride: 104 mmol/L (ref 98–111)
Creatinine, Ser: 2.18 mg/dL — ABNORMAL HIGH (ref 0.44–1.00)
GFR, Estimated: 32 mL/min — ABNORMAL LOW (ref >60)
Glucose, Bld: 112 mg/dL — ABNORMAL HIGH (ref 70–99)
Potassium: 3.7 mmol/L (ref 3.5–5.1)
Sodium: 136 mmol/L (ref 135–145)

## 2022-05-11 LAB — MAGNESIUM
Magnesium: 1.2 mg/dL — ABNORMAL LOW (ref 1.7–2.4)
Magnesium: 1.8 mg/dL (ref 1.7–2.4)

## 2022-05-11 LAB — PHOSPHORUS: Phosphorus: 2.5 mg/dL (ref 2.5–4.6)

## 2022-05-11 LAB — TROPONIN I (HIGH SENSITIVITY): Troponin I (High Sensitivity): 10 ng/L (ref <18)

## 2022-05-11 LAB — D-DIMER, QUANTITATIVE: D-Dimer, Quant: 3.44 ug/mL-FEU — ABNORMAL HIGH (ref 0.00–0.50)

## 2022-05-11 LAB — LACTIC ACID, PLASMA: Lactic Acid, Venous: 2.7 mmol/L (ref 0.5–1.9)

## 2022-05-11 LAB — LIPASE, BLOOD: Lipase: 20 U/L (ref 11–51)

## 2022-05-11 MED ORDER — BREAST MILK
ORAL | Status: DC
  Filled 2022-05-11 (×10): qty 1

## 2022-05-11 MED ORDER — PANTOPRAZOLE SODIUM 40 MG PO TBEC
40.0000 mg | DELAYED_RELEASE_TABLET | Freq: Every day | ORAL | Status: DC
  Administered 2022-05-11 – 2022-05-13 (×3): 40 mg via ORAL
  Filled 2022-05-11 (×3): qty 1

## 2022-05-11 MED ORDER — SODIUM CHLORIDE 0.9 % IV SOLN: INTRAVENOUS | Status: DC

## 2022-05-11 MED ORDER — LACTATED RINGERS IV BOLUS
1000.0000 mL | Freq: Once | INTRAVENOUS | Status: AC
Start: 2022-05-11 — End: 2022-05-11
  Administered 2022-05-11: 1000 mL via INTRAVENOUS

## 2022-05-11 MED ORDER — MAGNESIUM SULFATE 4 GM/100ML IV SOLN
4.0000 g | Freq: Once | INTRAVENOUS | Status: AC
Start: 2022-05-11 — End: 2022-05-11
  Administered 2022-05-11: 4 g via INTRAVENOUS
  Filled 2022-05-11: qty 100

## 2022-05-11 MED ORDER — LACTATED RINGERS IV BOLUS
2000.0000 mL | Freq: Once | INTRAVENOUS | Status: AC
  Administered 2022-05-11: 2000 mL via INTRAVENOUS

## 2022-05-11 MED ORDER — BREAST MILK/FORMULA (FOR LABEL PRINTING ONLY): ORAL | Status: DC

## 2022-05-11 MED ORDER — GADOBUTROL 1 MMOL/ML IV SOLN
10.0000 mL | Freq: Once | INTRAVENOUS | Status: AC | PRN
  Administered 2022-05-11: 10 mL via INTRAVENOUS

## 2022-05-11 MED ORDER — ALBUMIN HUMAN 5 % IV SOLN
25.0000 g | Freq: Once | INTRAVENOUS | Status: DC
  Filled 2022-05-11: qty 500

## 2022-05-11 MED ORDER — SODIUM CHLORIDE 0.9 % IV SOLN
1.0000 g | Freq: Once | INTRAVENOUS | Status: AC
  Administered 2022-05-11: 1 g via INTRAVENOUS
  Filled 2022-05-11: qty 10

## 2022-05-11 MED ORDER — ONDANSETRON HCL 4 MG/2ML IJ SOLN
4.0000 mg | Freq: Four times a day (QID) | INTRAMUSCULAR | Status: DC | PRN
Start: 2022-05-11 — End: 2022-05-18
  Administered 2022-05-11 – 2022-05-13 (×2): 4 mg via INTRAVENOUS
  Filled 2022-05-11 (×2): qty 2

## 2022-05-11 MED ORDER — OXYCODONE HCL 5 MG PO TABS: 5.0000 mg | ORAL_TABLET | Freq: Four times a day (QID) | ORAL | Status: DC | PRN

## 2022-05-11 MED ORDER — ACETAMINOPHEN 500 MG PO TABS
500.0000 mg | ORAL_TABLET | Freq: Three times a day (TID) | ORAL | Status: AC
  Administered 2022-05-11 – 2022-05-12 (×3): 500 mg via ORAL
  Filled 2022-05-11 (×3): qty 1

## 2022-05-11 MED ORDER — HYDROMORPHONE HCL 1 MG/ML IJ SOLN
0.5000 mg | INTRAMUSCULAR | Status: DC | PRN
  Filled 2022-05-11: qty 0.5

## 2022-05-11 MED ORDER — HYDROMORPHONE HCL 1 MG/ML IJ SOLN
INTRAMUSCULAR | Status: AC
  Filled 2022-05-11: qty 1

## 2022-05-11 MED ORDER — SENNOSIDES-DOCUSATE SODIUM 8.6-50 MG PO TABS
1.0000 | ORAL_TABLET | Freq: Every day | ORAL | Status: AC
Start: 2022-05-11 — End: 2022-05-13
  Administered 2022-05-11: 1 via ORAL
  Filled 2022-05-11: qty 1

## 2022-05-11 MED ORDER — LACTATED RINGERS IV BOLUS
1000.0000 mL | Freq: Once | INTRAVENOUS | Status: AC
  Administered 2022-05-11: 1000 mL via INTRAVENOUS

## 2022-05-11 MED ORDER — CHLORHEXIDINE GLUCONATE CLOTH 2 % EX PADS
6.0000 | MEDICATED_PAD | Freq: Every day | CUTANEOUS | Status: DC
Start: 2022-05-11 — End: 2022-05-18
  Administered 2022-05-11 – 2022-05-17 (×7): 6 via TOPICAL

## 2022-05-11 MED ORDER — MIDODRINE HCL 5 MG PO TABS
5.0000 mg | ORAL_TABLET | Freq: Three times a day (TID) | ORAL | Status: DC
  Administered 2022-05-11 – 2022-05-13 (×6): 5 mg via ORAL
  Filled 2022-05-11 (×6): qty 1

## 2022-05-11 MED ORDER — POLYETHYLENE GLYCOL 3350 17 G PO PACK: 17.0000 g | PACK | Freq: Every day | ORAL | Status: DC | PRN

## 2022-05-11 MED ORDER — LACTATED RINGERS IV SOLN: INTRAVENOUS | Status: DC

## 2022-05-11 MED ORDER — SODIUM CHLORIDE 0.9 % IV SOLN
2.0000 g | INTRAVENOUS | Status: DC
  Administered 2022-05-11 – 2022-05-14 (×4): 2 g via INTRAVENOUS
  Filled 2022-05-11 (×4): qty 20

## 2022-05-11 MED ORDER — PROCHLORPERAZINE EDISYLATE 10 MG/2ML IJ SOLN: 10.0000 mg | Freq: Four times a day (QID) | INTRAMUSCULAR | Status: DC | PRN

## 2022-05-11 MED ORDER — KETOROLAC TROMETHAMINE 15 MG/ML IJ SOLN: 15.0000 mg | Freq: Four times a day (QID) | INTRAMUSCULAR | Status: DC

## 2022-05-11 MED ORDER — COMPLETENATE 29-1 MG PO CHEW
1.0000 | CHEWABLE_TABLET | Freq: Every day | ORAL | Status: DC
  Administered 2022-05-11 – 2022-05-18 (×7): 1 via ORAL
  Filled 2022-05-11 (×9): qty 1

## 2022-05-11 MED ORDER — ENOXAPARIN SODIUM 40 MG/0.4ML IJ SOSY
40.0000 mg | PREFILLED_SYRINGE | INTRAMUSCULAR | Status: DC
  Administered 2022-05-11 – 2022-05-12 (×2): 40 mg via SUBCUTANEOUS
  Filled 2022-05-11 (×2): qty 0.4

## 2022-05-11 MED ORDER — TRAMADOL HCL 50 MG PO TABS
50.0000 mg | ORAL_TABLET | Freq: Four times a day (QID) | ORAL | Status: DC | PRN
  Administered 2022-05-11 – 2022-05-12 (×2): 50 mg via ORAL
  Filled 2022-05-11 (×2): qty 1

## 2022-05-11 MED ORDER — LACTATED RINGERS IV SOLN
INTRAVENOUS | Status: DC
Start: 2022-05-11 — End: 2022-05-11

## 2022-05-11 MED ORDER — VITAFOL GUMMIES 3.33-0.333-34.8 MG PO CHEW: 1.0000 | CHEWABLE_TABLET | Freq: Every day | ORAL | Status: DC

## 2022-05-11 MED ORDER — NOREPINEPHRINE 4 MG/250ML-% IV SOLN
2.0000 ug/min | INTRAVENOUS | Status: DC
  Administered 2022-05-11: 2 ug/min via INTRAVENOUS
  Administered 2022-05-12: 3 ug/min via INTRAVENOUS
  Filled 2022-05-11 (×2): qty 250

## 2022-05-11 MED ORDER — LACTATED RINGERS IV BOLUS
500.0000 mL | Freq: Once | INTRAVENOUS | Status: AC
Start: 2022-05-11 — End: 2022-05-11
  Administered 2022-05-11: 500 mL via INTRAVENOUS

## 2022-05-11 MED ORDER — METHOCARBAMOL 500 MG PO TABS
500.0000 mg | ORAL_TABLET | Freq: Three times a day (TID) | ORAL | Status: DC | PRN
  Administered 2022-05-14 – 2022-05-17 (×5): 500 mg via ORAL
  Filled 2022-05-11 (×6): qty 1

## 2022-05-11 MED ORDER — MIDODRINE HCL 5 MG PO TABS: 5.0000 mg | ORAL_TABLET | Freq: Three times a day (TID) | ORAL | Status: DC

## 2022-05-11 MED ORDER — SODIUM CHLORIDE 0.9 % IV SOLN
250.0000 mL | INTRAVENOUS | Status: DC
  Administered 2022-05-11: 250 mL via INTRAVENOUS

## 2022-05-11 MED ORDER — MORPHINE SULFATE (PF) 2 MG/ML IV SOLN
2.0000 mg | INTRAVENOUS | Status: DC | PRN
  Administered 2022-05-11 – 2022-05-12 (×3): 2 mg via INTRAVENOUS
  Filled 2022-05-11 (×3): qty 1

## 2022-05-11 MED ORDER — MELATONIN 5 MG PO TABS
5.0000 mg | ORAL_TABLET | Freq: Every evening | ORAL | Status: DC | PRN
  Administered 2022-05-16 – 2022-05-18 (×2): 5 mg via ORAL
  Filled 2022-05-11 (×3): qty 1

## 2022-05-11 MED ORDER — IBUPROFEN 200 MG PO TABS
400.0000 mg | ORAL_TABLET | Freq: Four times a day (QID) | ORAL | Status: DC | PRN
  Administered 2022-05-11: 400 mg via ORAL
  Filled 2022-05-11: qty 2

## 2022-05-11 MED ORDER — HYDROMORPHONE HCL 1 MG/ML IJ SOLN: 0.5000 mg | Freq: Once | INTRAMUSCULAR | Status: DC

## 2022-05-11 NOTE — H&P (Addendum)
History and Physical  [name] IOE:[id number] DOB: 2001/03/07 DOA: 05/10/2022  Referring physician:  Micheal Likens, EDP PCP: Patient, No Pcp Per  Outpatient Specialists: OBGYN Patient coming from: Home  Chief Complaint: Left lower back pain   HPI: Kathryn Pena is a 21 y.o. female with medical history significant for gestational hypertension, recent group B streptococcal UTI, obesity, postpartum less than 1 month ago, who presented to [facility] ED from home with complaints of constant left-sided lower back pain x2 days.  Associated with dysuria.  The pain was not relieved by over-the-counter pain medications.[facility] Kitchen  EMS was activated.  She was brought into the ED for further evaluation.  Due to complaint of having pain around epidural site, EDP ordered MRI spine.  Results came back non acute for the spine but positive for left perinephric fat stranding consistent with left pyelonephritis.  UA positive for pyuria.  Febrile with Tmax 102.1, tachycardic of 125, tachypneic with RR 32, and leukocytosis 17.5 K.  Urine culture and blood cultures were obtained.  The patient was started on Rocephin empirically and received 3 L IV fluid boluses LR.  TRH, hospitalist service, was asked to admit.  ED Course: Tmax 102.1.  BP 115/73, pulse 124, respiration rate 22, O2 saturation 100% on room air.  Lab studies remarkable for WBC 17.8, hemoglobin 11.3, 8.5, neutrophil count 16.4.  Creatinine 1.25, GFR greater than 60.  Lactic acid 1.2.  Review of Systems: Review of systems as noted in the HPI. All other systems reviewed and are negative.   Past Medical History:  Diagnosis Date   Pregnancy induced hypertension    Past Surgical History:  Procedure Laterality Date   NO PAST SURGERIES      Social History:  reports that she quit smoking about 3 years ago. Her smoking use included cigars. She has never used smokeless tobacco. She reports that she does not drink alcohol and does not use drugs.   No Known  Allergies  Family History  Problem Relation Age of Onset   Healthy Mother    Hypertension Maternal Aunt    Diabetes Neg Hx    Heart disease Neg Hx    Stroke Neg Hx       Prior to Admission medications   Medication Sig Start Date End Date Taking? Authorizing Provider  acetaminophen (TYLENOL) 500 MG tablet Take 500 mg by mouth every 6 (six) hours as needed for moderate pain.   Yes [provider]  ibuprofen (ADVIL) 200 MG tablet Take 200 mg by mouth every 6 (six) hours as needed for moderate pain.   Yes [provider]  NIFEdipine (ADALAT CC) 30 MG 24 hr tablet Take 1 tablet (30 mg total) by mouth daily. 04/16/22  Yes Levie Heritage, DO  acetaminophen (TYLENOL) 500 MG tablet Take 2 tablets (1,000 mg total) by mouth every 6 (six) hours as needed for moderate pain. Patient not taking: Reported on 05/10/2022 09/10/21   Donette Larry, CNM  furosemide (LASIX) 20 MG tablet Take 1 tablet (20 mg total) by mouth daily for 4 days. 04/14/22 04/18/22  Carlynn Herald, CNM  ibuprofen (ADVIL) 600 MG tablet Take 1 tablet (600 mg total) by mouth every 6 (six) hours as needed. Patient not taking: Reported on 05/10/2022 04/14/22   Carlynn Herald, CNM  ibuprofen (ADVIL) 600 MG tablet Take 1 tablet (600 mg total) by mouth every 6 (six) hours. Patient not taking: Reported on 05/10/2022 04/15/22   Levie Heritage, DO  Prenatal Vit-Fe  Phos-FA-Omega (VITAFOL GUMMIES) 3.33-0.333-34.8 MG CHEW Chew 1 tablet by mouth daily. Patient not taking: Reported on 05/10/2022 09/02/21   Constant, Peggy, MD  enalapril (VASOTEC) 5 MG tablet Take 1 tablet (5 mg total) by mouth daily. 02/24/19 12/21/19  Hermina StaggersErvin, Michael L, MD    Physical Exam: BP 115/73   Pulse (!) 125   Temp (!) 102.1 F (38.9 C) (Oral)   Resp (!) 32   LMP 06/18/2021 (Approximate)   SpO2 100%   Breastfeeding Yes   General: 21 y.o. year-old female well developed well nourished in no acute distress.  Alert and oriented  x3. Cardiovascular: Tachycardic with no rubs or gallops.  No thyromegaly or JVD noted.  No lower extremity edema. 2/4 pulses in all 4 extremities. Respiratory: Clear to auscultation with no wheezes or rales. Good inspiratory effort. Abdomen: Left flank tenderness on palpation.  Nondistended with normal bowel sounds x4 quadrants. Muskuloskeletal: No cyanosis, clubbing or edema noted bilaterally Neuro: CN II-XII intact, strength, sensation, reflexes Skin: No ulcerative lesions noted or rashes Psychiatry: Judgement and insight appear normal. Mood is appropriate for condition and setting          Labs on Admission:  Basic Metabolic Panel: Recent Labs  Lab 05/10/22 2037  NA 139  K 3.5  CL 108  CO2 23  GLUCOSE 101*  BUN 12  CREATININE 1.25*  CALCIUM 8.6*   Liver Function Tests: Recent Labs  Lab 05/10/22 2037  AST 18  ALT 17  ALKPHOS 68  BILITOT 1.2  PROT 6.5  ALBUMIN 3.3*   No results for input(s): "LIPASE", "AMYLASE" in the last 168 hours. No results for input(s): "AMMONIA" in the last 168 hours. CBC: Recent Labs  Lab 05/10/22 2037  WBC 17.8*  NEUTROABS 16.4*  HGB 11.3*  HCT 35.6*  MCV 85.8  PLT 232   Cardiac Enzymes: No results for input(s): "CKTOTAL", "CKMB", "CKMBINDEX", "TROPONINI" in the last 168 hours.  BNP (last 3 results) No results for input(s): "BNP" in the last 8760 hours.  ProBNP (last 3 results) No results for input(s): "PROBNP" in the last 8760 hours.  CBG: No results for input(s): "GLUCAP" in the last 168 hours.  Radiological Exams on Admission: MR Lumbar Spine W Wo Contrast  Result Date: 05/11/2022 CLINICAL DATA:  Initial evaluation for neck and back pain, infection suspected. EXAM: MRI CERVICAL, THORACIC AND LUMBAR SPINE WITHOUT AND WITH CONTRAST TECHNIQUE: Multiplanar and multiecho pulse sequences of the cervical spine, to include the craniocervical junction and cervicothoracic junction, and thoracic and lumbar spine, were obtained without  and with intravenous contrast. CONTRAST:  10mL GADAVIST GADOBUTROL 1 MMOL/ML IV SOLN COMPARISON:  None Available. FINDINGS: MRI CERVICAL SPINE FINDINGS Alignment: Straightening of the normal cervical lordosis. No listhesis. Vertebrae: Chronic height loss noted at the superior endplate of T3. Otherwise, vertebral body height maintained. Bone marrow signal intensity diffusely decreased on T1 weighted imaging, which could be within normal limits in this young patient. No discrete or worrisome osseous lesions. No evidence for osteomyelitis discitis or septic arthritis. Cord: Normal signal and morphology. No abnormal enhancement. No epidural collections. Posterior Fossa, vertebral arteries, paraspinal tissues: Mild cerebellar tonsillar ectopia of up to approximately 4 mm at the foramen magnum without frank Chiari malformation. Visualized brain and posterior fossa otherwise unremarkable. Craniocervical junction otherwise unremarkable. Paraspinous soft tissues within normal limits. Normal flow voids seen within the vertebral arteries bilaterally. Disc levels: C2-C3: Unremarkable. C3-C4:  Unremarkable. C4-C5: Mild disc bulge. No significant canal or foraminal stenosis. C5-C6: Mild disc  bulge. No significant canal or foraminal stenosis. C6-C7:  Unremarkable. C7-T1:  Unremarkable. MRI THORACIC SPINE FINDINGS Alignment: Straightening of the normal midthoracic kyphosis. No listhesis. Vertebrae: Mild chronic compression deformities noted at the superior endplates of T3 and T4 without retropulsion. Vertebral body height otherwise maintained. Underlying bone marrow signal intensity somewhat diffusely decreased on T1 weighted imaging, which may be normal in this young patient. No discrete or worrisome osseous lesions. No evidence for osteomyelitis discitis or septic arthritis. Cord: Normal signal and morphology. No abnormal enhancement. No epidural abscess or other collection. Paraspinal and other soft tissues: Paraspinous soft  tissues within normal limits. Trace layering bilateral pleural effusions noted. Asymmetric enlargement of the left kidney noted, partially visualized, better characterized on corresponding lumbar spine portion of this exam. Disc levels: No significant disc pathology seen within the thoracic spine. No disc bulge or focal disc herniation. No stenosis or impingement. MRI LUMBAR SPINE FINDINGS Segmentation: Standard. Lowest well-formed disc space labeled the L5-S1 level. Alignment: Physiologic with preservation of the normal lumbar lordosis. No listhesis. Vertebrae: Vertebral body height maintained without acute or chronic fracture. Bone marrow signal intensity somewhat diffusely decreased on T1 weighted imaging, which could be within normal limits in this young patient. No discrete or worrisome osseous lesions. No evidence for osteomyelitis discitis or septic arthritis. Conus medullaris and cauda equina: Conus extends to the L1-2 level. Conus and cauda equina appear normal. No abnormal enhancement. No epidural collections. Paraspinal and other soft tissues: Paraspinous soft tissues within normal limits. There is asymmetric enlargement with somewhat striated appearance of the left kidney, with associated perinephric fat stranding. Finding raises the possibility for acute pyelonephritis. Mildly prominent retroperitoneal lymph nodes measure up to 9 mm, which could be reactive. Disc levels: No significant disc pathology seen within the lumbar spine. Intervertebral discs are well hydrated with preserved disc height. No disc bulge or focal disc herniation. No significant facet disease. No canal or neural foraminal stenosis or evidence for neural impingement. IMPRESSION: 1. No MRI evidence for acute infection within the cervical, thoracic, or lumbar spine. 2. Asymmetric enlargement with somewhat striated appearance of the left kidney with associated perinephric fat stranding. Finding raises the possibility for acute  pyelonephritis. Correlation with laboratory values and urinalysis recommended. 3. Mild noncompressive disc bulging at C4-5 and C5-6 without stenosis. 4. Mild chronic compression deformities at the superior endplates of T3 and T4. No retropulsion or stenosis. Electronically Signed   By: Rise Mu M.D.   On: 05/11/2022 02:42   MR THORACIC SPINE W WO CONTRAST  Result Date: 05/11/2022 CLINICAL DATA:  Initial evaluation for neck and back pain, infection suspected. EXAM: MRI CERVICAL, THORACIC AND LUMBAR SPINE WITHOUT AND WITH CONTRAST TECHNIQUE: Multiplanar and multiecho pulse sequences of the cervical spine, to include the craniocervical junction and cervicothoracic junction, and thoracic and lumbar spine, were obtained without and with intravenous contrast. CONTRAST:  47mL GADAVIST GADOBUTROL 1 MMOL/ML IV SOLN COMPARISON:  None Available. FINDINGS: MRI CERVICAL SPINE FINDINGS Alignment: Straightening of the normal cervical lordosis. No listhesis. Vertebrae: Chronic height loss noted at the superior endplate of T3. Otherwise, vertebral body height maintained. Bone marrow signal intensity diffusely decreased on T1 weighted imaging, which could be within normal limits in this young patient. No discrete or worrisome osseous lesions. No evidence for osteomyelitis discitis or septic arthritis. Cord: Normal signal and morphology. No abnormal enhancement. No epidural collections. Posterior Fossa, vertebral arteries, paraspinal tissues: Mild cerebellar tonsillar ectopia of up to approximately 4 mm at the foramen magnum without  frank Chiari malformation. Visualized brain and posterior fossa otherwise unremarkable. Craniocervical junction otherwise unremarkable. Paraspinous soft tissues within normal limits. Normal flow voids seen within the vertebral arteries bilaterally. Disc levels: C2-C3: Unremarkable. C3-C4:  Unremarkable. C4-C5: Mild disc bulge. No significant canal or foraminal stenosis. C5-C6: Mild disc  bulge. No significant canal or foraminal stenosis. C6-C7:  Unremarkable. C7-T1:  Unremarkable. MRI THORACIC SPINE FINDINGS Alignment: Straightening of the normal midthoracic kyphosis. No listhesis. Vertebrae: Mild chronic compression deformities noted at the superior endplates of T3 and T4 without retropulsion. Vertebral body height otherwise maintained. Underlying bone marrow signal intensity somewhat diffusely decreased on T1 weighted imaging, which may be normal in this young patient. No discrete or worrisome osseous lesions. No evidence for osteomyelitis discitis or septic arthritis. Cord: Normal signal and morphology. No abnormal enhancement. No epidural abscess or other collection. Paraspinal and other soft tissues: Paraspinous soft tissues within normal limits. Trace layering bilateral pleural effusions noted. Asymmetric enlargement of the left kidney noted, partially visualized, better characterized on corresponding lumbar spine portion of this exam. Disc levels: No significant disc pathology seen within the thoracic spine. No disc bulge or focal disc herniation. No stenosis or impingement. MRI LUMBAR SPINE FINDINGS Segmentation: Standard. Lowest well-formed disc space labeled the L5-S1 level. Alignment: Physiologic with preservation of the normal lumbar lordosis. No listhesis. Vertebrae: Vertebral body height maintained without acute or chronic fracture. Bone marrow signal intensity somewhat diffusely decreased on T1 weighted imaging, which could be within normal limits in this young patient. No discrete or worrisome osseous lesions. No evidence for osteomyelitis discitis or septic arthritis. Conus medullaris and cauda equina: Conus extends to the L1-2 level. Conus and cauda equina appear normal. No abnormal enhancement. No epidural collections. Paraspinal and other soft tissues: Paraspinous soft tissues within normal limits. There is asymmetric enlargement with somewhat striated appearance of the left  kidney, with associated perinephric fat stranding. Finding raises the possibility for acute pyelonephritis. Mildly prominent retroperitoneal lymph nodes measure up to 9 mm, which could be reactive. Disc levels: No significant disc pathology seen within the lumbar spine. Intervertebral discs are well hydrated with preserved disc height. No disc bulge or focal disc herniation. No significant facet disease. No canal or neural foraminal stenosis or evidence for neural impingement. IMPRESSION: 1. No MRI evidence for acute infection within the cervical, thoracic, or lumbar spine. 2. Asymmetric enlargement with somewhat striated appearance of the left kidney with associated perinephric fat stranding. Finding raises the possibility for acute pyelonephritis. Correlation with laboratory values and urinalysis recommended. 3. Mild noncompressive disc bulging at C4-5 and C5-6 without stenosis. 4. Mild chronic compression deformities at the superior endplates of T3 and T4. No retropulsion or stenosis. Electronically Signed   By: Rise Mu M.D.   On: 05/11/2022 02:42   MR Cervical Spine W or Wo Contrast  Result Date: 05/11/2022 CLINICAL DATA:  Initial evaluation for neck and back pain, infection suspected. EXAM: MRI CERVICAL, THORACIC AND LUMBAR SPINE WITHOUT AND WITH CONTRAST TECHNIQUE: Multiplanar and multiecho pulse sequences of the cervical spine, to include the craniocervical junction and cervicothoracic junction, and thoracic and lumbar spine, were obtained without and with intravenous contrast. CONTRAST:  10mL GADAVIST GADOBUTROL 1 MMOL/ML IV SOLN COMPARISON:  None Available. FINDINGS: MRI CERVICAL SPINE FINDINGS Alignment: Straightening of the normal cervical lordosis. No listhesis. Vertebrae: Chronic height loss noted at the superior endplate of T3. Otherwise, vertebral body height maintained. Bone marrow signal intensity diffusely decreased on T1 weighted imaging, which could be within normal limits  in  this young patient. No discrete or worrisome osseous lesions. No evidence for osteomyelitis discitis or septic arthritis. Cord: Normal signal and morphology. No abnormal enhancement. No epidural collections. Posterior Fossa, vertebral arteries, paraspinal tissues: Mild cerebellar tonsillar ectopia of up to approximately 4 mm at the foramen magnum without frank Chiari malformation. Visualized brain and posterior fossa otherwise unremarkable. Craniocervical junction otherwise unremarkable. Paraspinous soft tissues within normal limits. Normal flow voids seen within the vertebral arteries bilaterally. Disc levels: C2-C3: Unremarkable. C3-C4:  Unremarkable. C4-C5: Mild disc bulge. No significant canal or foraminal stenosis. C5-C6: Mild disc bulge. No significant canal or foraminal stenosis. C6-C7:  Unremarkable. C7-T1:  Unremarkable. MRI THORACIC SPINE FINDINGS Alignment: Straightening of the normal midthoracic kyphosis. No listhesis. Vertebrae: Mild chronic compression deformities noted at the superior endplates of T3 and T4 without retropulsion. Vertebral body height otherwise maintained. Underlying bone marrow signal intensity somewhat diffusely decreased on T1 weighted imaging, which may be normal in this young patient. No discrete or worrisome osseous lesions. No evidence for osteomyelitis discitis or septic arthritis. Cord: Normal signal and morphology. No abnormal enhancement. No epidural abscess or other collection. Paraspinal and other soft tissues: Paraspinous soft tissues within normal limits. Trace layering bilateral pleural effusions noted. Asymmetric enlargement of the left kidney noted, partially visualized, better characterized on corresponding lumbar spine portion of this exam. Disc levels: No significant disc pathology seen within the thoracic spine. No disc bulge or focal disc herniation. No stenosis or impingement. MRI LUMBAR SPINE FINDINGS Segmentation: Standard. Lowest well-formed disc space  labeled the L5-S1 level. Alignment: Physiologic with preservation of the normal lumbar lordosis. No listhesis. Vertebrae: Vertebral body height maintained without acute or chronic fracture. Bone marrow signal intensity somewhat diffusely decreased on T1 weighted imaging, which could be within normal limits in this young patient. No discrete or worrisome osseous lesions. No evidence for osteomyelitis discitis or septic arthritis. Conus medullaris and cauda equina: Conus extends to the L1-2 level. Conus and cauda equina appear normal. No abnormal enhancement. No epidural collections. Paraspinal and other soft tissues: Paraspinous soft tissues within normal limits. There is asymmetric enlargement with somewhat striated appearance of the left kidney, with associated perinephric fat stranding. Finding raises the possibility for acute pyelonephritis. Mildly prominent retroperitoneal lymph nodes measure up to 9 mm, which could be reactive. Disc levels: No significant disc pathology seen within the lumbar spine. Intervertebral discs are well hydrated with preserved disc height. No disc bulge or focal disc herniation. No significant facet disease. No canal or neural foraminal stenosis or evidence for neural impingement. IMPRESSION: 1. No MRI evidence for acute infection within the cervical, thoracic, or lumbar spine. 2. Asymmetric enlargement with somewhat striated appearance of the left kidney with associated perinephric fat stranding. Finding raises the possibility for acute pyelonephritis. Correlation with laboratory values and urinalysis recommended. 3. Mild noncompressive disc bulging at C4-5 and C5-6 without stenosis. 4. Mild chronic compression deformities at the superior endplates of T3 and T4. No retropulsion or stenosis. Electronically Signed   By: Rise Mu M.D.   On: 05/11/2022 02:42   DG Chest Port 1 View  Result Date: 05/10/2022 CLINICAL DATA:  Questionable sepsis.  Chronic back pain. EXAM:  PORTABLE CHEST 1 VIEW COMPARISON:  Chest radiograph 10/13/2017. FINDINGS: Of note, patient is mildly rotated on this portable exam. Mild enlargement of the cardiac silhouette, likely secondary to portable technique. No focal consolidation, pleural effusion, or pneumothorax. The visualized skeletal structures are unremarkable. IMPRESSION: No acute cardiopulmonary abnormality. Electronically Signed  By: Sherron Ales M.D.   On: 05/10/2022 20:38    EKG: I independently viewed the EKG done and my findings are as followed: None available at the time of this dictation.  Ordered.  Assessment/Plan Present on Admission:  Pyelonephritis  Principal Problem:   Pyelonephritis  Sepsis secondary to left pyelonephritis, seen on MRI Left flank pain, dysuria, urine positive for pyuria Febrile with Tmax 102.1, pulse 125, RR 32, and WBC 17.5 K.   Urine culture and blood cultures were obtained.   Started on Rocephin empirically in the ED, continue. Continue IV fluid hydration Follow urine culture and blood cultures for ID and sensitivities. Narrow down antibiotics when able Pharmacy consulted to assist with choice of medications due to breast-feeding, less than 1 month postpartum.  Pharmacy will provide alternatives if needed.  Recent group B streptococcal UTI Urine culture from 10/15/2021 grew group B streptococcus, sensitive to Rocephin Continue Rocephin empirically, narrow down antibiotics when able  Tachycardia, likely in the setting of sepsis Rule out other causes Obtain twelve-lead EKG Obtain TSH Continue IV antibiotic and IV fluid for now.  Gestational hypertension, BPs are currently soft Hold off home oral antihypertensives when blood pressures are soft to avoid hypotension. Monitor on telemetry Maintain MAP greater than 65.  AKI, suspect prerenal in the setting of polyuria Baseline creatinine appears to be 0.6 with GFR greater than 60 Presented with creatinine of 1.25 Received 3 L IV fluid  hydration in the ED, currently on maintenance IV fluid LR 100 cc/h x 2 days. Avoid nephrotoxic agents, dehydration and hypotension Monitor urine output with strict I's and O's Repeat renal panel in the morning  Obesity Recommend weight loss outpatient with regular physical activity and healthy dieting.  Chronic normocytic anemia Hemoglobin is currently stable 11.3 Monitor  Postpartum, less than 1 month, and breast-feeding Pharmacy consulted to assist with the management of medications and to find alternatives to prescribed medications as needed    Critical care time: 65 minutes.    DVT prophylaxis: Subcu Lovenox daily  Code Status: Full code  Family Communication: Updated the patient's mother at bedside  Disposition Plan: Admitted to progressive unit  Consults called: None.  Admission status: Inpatient status.   Status is: Inpatient The patient requires at least 2 midnights for further evaluation and treatment of present condition.   Darlin Drop MD [facility] Pager [phone]  If 7PM-7AM, please contact night-coverage [email or web address] Password [facility]  05/11/2022, 3:48 AM

## 2022-05-11 NOTE — Lactation Note (Signed)
Lactation Consultation Note Breast feeding parent admitted to hospital. Has 11 month old at home. Mom pumps and bottle feeds q2hr. Mom hasn't pumped since sat. Breast had knots and tender but not totally engorged. LC massaged one breast MGM massage other breast. Collected 4 oz. RN asked to call Pharmacy to get BF labels. Mom chooses to freeze BM. Extra bottles given and wash pan and soap given as well. Suggested mom pump q3hrs. Milk storage reviewed.  Patient Name: Kathryn Pena Date: 05/11/2022 Reason for consult: Initial assessment Age:21 y.o.  Maternal Data    Feeding    LATCH Score       Type of Nipple: Everted at rest and after stimulation (semi flat)  Comfort (Breast/Nipple): Filling, red/small blisters or bruises, mild/mod discomfort         Lactation Tools Discussed/Used Tools: Pump Breast pump type: Double-Electric Breast Pump Pump Education: Setup, frequency, and cleaning;Milk Storage Reason for Pumping: pump and bottle feeding Pumping frequency: q3h Pumped volume: 4 mL  Interventions Interventions: Breast massage;Breast compression;DEBP  Discharge Discharge Education: Engorgement and breast care  Consult Status Consult Status: Complete    Mellody Masri G 05/11/2022, 10:07 PM

## 2022-05-11 NOTE — Progress Notes (Signed)
Echo c/w postpartum vs septic cardiomyopathy.  Consider cards consult in AM.  Myrla Halsted MD PCCM

## 2022-05-11 NOTE — Progress Notes (Signed)
[facility] PLAN OF CARE NOTE Patient: Kathryn Pena XLK:[id number]   PCP: Patient, No Pcp Per DOB: 10-Oct-2001   DOA: 05/10/2022   DOS: 05/11/2022    Patient was admitted by my colleague earlier on 05/11/2022. I have reviewed the H&P as well as assessment and plan and agree with the same. Important changes in the plan are listed below.  Patient reported fatigue and tiredness initially as well as epigastric pain and substernal chest pain.  No cough no shortness of breath.  She continues to have left-sided flank pain.  No diarrhea no constipation.  She denies any bleeding anywhere. Patient was tachycardic, tachypneic and continues to have fever.  As the day progressed patient becomes hypotensive with persistent tachycardia.  She was aggressively hydrated.  Despite which she remains hypotensive.  Work-up was initiated including troponin, D-dimer, lactic acid, lipase level also lab was having hard time getting a blood draw. Blood cultures came back positive for E. coli.  Patient was on IV ceftriaxone which appears to be appropriate antibiotic for this infection.  Blood pressure improved somewhat but remains in the 90s systolic. Patient becomes more fatigued and tired and requires oxygen for comfort without any evident hypoxia with persistent hypotension and tachycardia.  At which time PCCM was consulted and patient is currently transferred to ICU.  Author: Lynden Oxford, MD [facility] 05/11/2022   If 7PM-7AM, please contact night-coverage at [email or web address]

## 2022-05-11 NOTE — Plan of Care (Signed)

## 2022-05-11 NOTE — Lactation Note (Signed)
Lactation Consultation Note LC assessed patients MAR. Robaxin L3 but states probably OK to BF Midodrine L3 but use caution Morphine L3 but use caution  Dilaudid L3 caution LC called RN to recommend to pump and dump while taking these medications until MD tells mom its OK.  Patient Name: CLEONA DOUBLEDAY [id number] Date: 05/11/2022 Reason for consult: Initial assessment Age:21 y.o.  Maternal Data    Feeding    LATCH Score       Type of Nipple: Everted at rest and after stimulation (semi flat)  Comfort (Breast/Nipple): Filling, red/small blisters or bruises, mild/mod discomfort         Lactation Tools Discussed/Used Tools: Pump Breast pump type: Double-Electric Breast Pump Pump Education: Setup, frequency, and cleaning;Milk Storage Reason for Pumping: pump and bottle feeding Pumping frequency: q3h Pumped volume: 4 mL  Interventions Interventions: Breast massage;Breast compression;DEBP  Discharge Discharge Education: Engorgement and breast care  Consult Status Consult Status: Complete    Jacquelina Hewins G 05/11/2022, 10:25 PM

## 2022-05-11 NOTE — Consult Note (Signed)
NAME:  Kathryn Pena, MRN:  [id number], DOB:  August 09, 2001, LOS: 0 ADMISSION DATE:  05/10/2022, CONSULTATION DATE:  05/11/2022 REFERRING MD:  Dr. Allena Katz - [facility] , CHIEF COMPLAINT: Sepsis  History of Present Illness:  Kathryn Pena is a 21 year old female with a past medical history for pregnancy-induced hypertension, recent group B streptococcus UTI, and is currently 1 month postpartum who presented to the emergency department 7/29 for complaints of acute onset back pain.  Per H&P patient was complaining of left-sided back pain that began 2 days prior to admission.  Back pain was also associated with dysuria and not relieved with over-the-counter medications.  Patient was admitted per hospitalist services for sepsis work-up where she was found to be E. coli bacteremia from likely pyelonephritis.  Work-up in ED included MRI cervical spine with possible left-sided pyelonephritis.  Afternoon of 7/30 PCCM called for worsening septic like picture.  Patient has had progressive hypotension with associated tachycardia and elevation and fever to Tmax 102.1.   Pertinent  Medical History  Pregnancy-induced hypertension, recent group B streptococcus UTI, and is currently 1 month postpartum  Significant Hospital Events: Including procedures, antibiotic start and stop dates in addition to other pertinent events   7/29 patient presented with left-sided back pain was found to have E. coli bacteremia likely in the setting of left-sided pyelonephritis seen on MRI 7/30 worsening septic shock including hypotension, tachycardia, and elevated fever  Interim History / Subjective:  Acutely ill-appearing adult female sitting up in bed with acute complaints of chest pain and nausea  Objective   Blood pressure (!) 88/48, pulse (!) 122, temperature 98.2 F (36.8 C), temperature source Oral, resp. rate 19, last menstrual period 06/18/2021, SpO2 98 %, currently breastfeeding.       No intake or output data in the 24  hours ending 05/11/22 1429 There were no vitals filed for this visit.  Examination: General: Acute ill-appearing adult female sitting up in bed in mild distress HEENT: [place]/AT, MM pink/moist, PERRL,  Neuro: Alert and interactive but flat affect CV: s1s2 regular rate and rhythm, no murmur, rubs, or gallops,  PULM: Clear to auscultation bilaterally, shallow breaths, satting 100% on 2 L nasal cannula mostly for comfort GI: soft, bowel sounds active in all 4 quadrants, non-tender, non-distended Extremities: warm/dry, 1+ lower extremity edema  Skin: no rashes or lesions  Resolved Hospital Problem list     Assessment & Plan:  Evolving severe sepsis likely secondary to left pyelonephritis with known bacteremia -Patient has worsening tachycardia, tachypnea with Tmax 102.1.  WBC 15.7 lactic acid on admission 1.2 but we have been unable to repeat due to l difficult IV stick.  Per chart review patient clinically looks worse compared to admission. -Blood cultures positive for Enterobacter and E. coli on admission Recent group B streptococcus UTI -Patient had positive urine culture October 15, 2021 for group B streptococcus sensitive to Rocephin P: Transfer ICU Supplemental oxygen as needed for SPO2 greater than 92 Follow blood cultures Broaden antibiotics to include Cefepime  Aggressive IV hydration to include second bolus now  May need to consider pressors to maintain MAP< 65  Trend lactic acid Monitor urine output Obtain CT renal study   Gestational hypertension -Per L&D notes not formally diagnosed with preeclampsia just gestational hypertension patient was induced due to elevated blood pressure P: Close monitoring of hemodynamics in the ICU setting Obtain echocardiogram Continuous telemetry  Acute kidney injury -Creatinine 0.64 with GFR greater than 60 on 04/12/22,  creatinine is 05/11/2022  1.90 with GFR 38 P: Follow renal function Monitor urine output Trend Bmet Avoid  nephrotoxins\ Ensure adequate renal perfusion  IV hydration  Chronic microcytic anemia P: Trend CBC Transfuse per protocol Hemoglobin goal greater than 7  1 month postpartum and breast-feeding P: Coordinate with pharmacy to prescribe breast feeding safe medications as able  Hypomagnesemia P: Supplement Trend  Best Practice (right click and "Reselect all SmartList Selections" daily)   Diet/type: Regular consistency (see orders) DVT prophylaxis: LMWH GI prophylaxis: PPI Lines: N/A Foley:  N/A Code Status:  full code Last date of multidisciplinary goals of care discussion: Patient updated at bedside   Labs   CBC: Recent Labs  Lab 05/10/22 2037 05/11/22 0610  WBC 17.8* 15.7*  NEUTROABS 16.4* 14.7*  HGB 11.3* 10.8*  HCT 35.6* 33.2*  MCV 85.8 84.5  PLT 232 206    Basic Metabolic Panel: Recent Labs  Lab 05/10/22 2037 05/11/22 0610  NA 139 136  K 3.5 3.9  CL 108 105  CO2 23 23  GLUCOSE 101* 137*  BUN 12 15  CREATININE 1.25* 1.90*  CALCIUM 8.6* 8.2*  MG  --  1.2*  PHOS  --  2.5   GFR: CrCl cannot be calculated (Unknown ideal weight.). Recent Labs  Lab 05/10/22 2037 05/11/22 0610  WBC 17.8* 15.7*  LATICACIDVEN 1.2  --     Liver Function Tests: Recent Labs  Lab 05/10/22 2037 05/11/22 0610  AST 18 19  ALT 17 17  ALKPHOS 68 62  BILITOT 1.2 0.6  PROT 6.5 6.0*  ALBUMIN 3.3* 2.8*   No results for input(s): "LIPASE", "AMYLASE" in the last 168 hours. No results for input(s): "AMMONIA" in the last 168 hours.  ABG No results found for: "PHART", "PCO2ART", "PO2ART", "HCO3", "TCO2", "ACIDBASEDEF", "O2SAT"   Coagulation Profile: Recent Labs  Lab 05/10/22 2037  INR 1.1    Cardiac Enzymes: No results for input(s): "CKTOTAL", "CKMB", "CKMBINDEX", "TROPONINI" in the last 168 hours.  HbA1C: No results found for: "HGBA1C"  CBG: No results for input(s): "GLUCAP" in the last 168 hours.  Review of Systems:   Patient unable to engage in  discussion to allow for full ROS  Past Medical History:  She,  has a past medical history of Pregnancy induced hypertension.   Surgical History:   Past Surgical History:  Procedure Laterality Date   NO PAST SURGERIES       Social History:   reports that she quit smoking about 3 years ago. Her smoking use included cigars. She has never used smokeless tobacco. She reports that she does not drink alcohol and does not use drugs.   Family History:  Her family history includes Healthy in her mother; Hypertension in her maternal aunt. There is no history of Diabetes, Heart disease, or Stroke.   Allergies No Known Allergies   Home Medications  Prior to Admission medications   Medication Sig Start Date End Date Taking? Authorizing Provider  acetaminophen (TYLENOL) 500 MG tablet Take 500 mg by mouth every 6 (six) hours as needed for moderate pain.   Yes [provider]  ibuprofen (ADVIL) 200 MG tablet Take 200 mg by mouth every 6 (six) hours as needed for moderate pain.   Yes [provider]  NIFEdipine (ADALAT CC) 30 MG 24 hr tablet Take 1 tablet (30 mg total) by mouth daily. 04/16/22  Yes Levie Heritage, DO  acetaminophen (TYLENOL) 500 MG tablet Take 2 tablets (1,000 mg total) by mouth every 6 (six) hours as  needed for moderate pain. Patient not taking: Reported on 05/10/2022 09/10/21   Donette Larry, CNM  furosemide (LASIX) 20 MG tablet Take 1 tablet (20 mg total) by mouth daily for 4 days. 04/14/22 04/18/22  Carlynn Herald, CNM  ibuprofen (ADVIL) 600 MG tablet Take 1 tablet (600 mg total) by mouth every 6 (six) hours as needed. Patient not taking: Reported on 05/10/2022 04/14/22   Carlynn Herald, CNM  ibuprofen (ADVIL) 600 MG tablet Take 1 tablet (600 mg total) by mouth every 6 (six) hours. Patient not taking: Reported on 05/10/2022 04/15/22   Levie Heritage, DO  Prenatal Vit-Fe Phos-FA-Omega (VITAFOL GUMMIES) 3.33-0.333-34.8 MG CHEW Chew 1 tablet by mouth  daily. Patient not taking: Reported on 05/10/2022 09/02/21   Constant, Peggy, MD  enalapril (VASOTEC) 5 MG tablet Take 1 tablet (5 mg total) by mouth daily. 02/24/19 12/21/19  Hermina Staggers, MD     Critical care time: 42 mins  Marina Desire D. Tiburcio Pea, NP-C [facility] Personal contact information can be found on Amion  05/11/2022, 2:55 PM

## 2022-05-11 NOTE — Progress Notes (Signed)
An USGPIV (ultrasound guided PIV) has been placed for short-term vasopressor infusion. A correctly placed ivWatch must be used when administering Vasopressors. Should this treatment be needed beyond 72 hours, central line access should be obtained.  It will be the responsibility of the bedside nurse to follow best practice to prevent extravasations.  HS Gerren Hoffmeier RN 

## 2022-05-11 NOTE — Progress Notes (Signed)
PHARMACY - PHYSICIAN COMMUNICATION CRITICAL VALUE ALERT - BLOOD CULTURE IDENTIFICATION (BCID)  Kathryn Pena is an 21 y.o. female who presented to [facility] on 05/10/2022 with a chief complaint of lower back pain  Assessment:  3 YOF with concern for UTI/pyelo now with 4/4 BCx growing GNR with BCID detecting E.coli, no resistance mechanisms noted.   Name of physician (or Provider) Contacted: Allena Katz  Current antibiotics: Rocephin 2g IV every 24 hours  Changes to prescribed antibiotics recommended:  Patient is on recommended antibiotics - No changes needed  Results for orders placed or performed during the hospital encounter of 05/10/22  Blood Culture ID Panel (Reflexed) (Collected: 05/10/2022  8:37 PM)  Result Value Ref Range   Enterococcus faecalis NOT DETECTED NOT DETECTED   Enterococcus Faecium NOT DETECTED NOT DETECTED   Listeria monocytogenes NOT DETECTED NOT DETECTED   Staphylococcus species NOT DETECTED NOT DETECTED   Staphylococcus aureus (BCID) NOT DETECTED NOT DETECTED   Staphylococcus epidermidis NOT DETECTED NOT DETECTED   Staphylococcus lugdunensis NOT DETECTED NOT DETECTED   Streptococcus species NOT DETECTED NOT DETECTED   Streptococcus agalactiae NOT DETECTED NOT DETECTED   Streptococcus pneumoniae NOT DETECTED NOT DETECTED   Streptococcus pyogenes NOT DETECTED NOT DETECTED   A.calcoaceticus-baumannii NOT DETECTED NOT DETECTED   Bacteroides fragilis NOT DETECTED NOT DETECTED   Enterobacterales DETECTED (A) NOT DETECTED   Enterobacter cloacae complex NOT DETECTED NOT DETECTED   Escherichia coli DETECTED (A) NOT DETECTED   Klebsiella aerogenes NOT DETECTED NOT DETECTED   Klebsiella oxytoca NOT DETECTED NOT DETECTED   Klebsiella pneumoniae NOT DETECTED NOT DETECTED   Proteus species NOT DETECTED NOT DETECTED   Salmonella species NOT DETECTED NOT DETECTED   Serratia marcescens NOT DETECTED NOT DETECTED   Haemophilus influenzae NOT DETECTED NOT DETECTED    Neisseria meningitidis NOT DETECTED NOT DETECTED   Pseudomonas aeruginosa NOT DETECTED NOT DETECTED   Stenotrophomonas maltophilia NOT DETECTED NOT DETECTED   Candida albicans NOT DETECTED NOT DETECTED   Candida auris NOT DETECTED NOT DETECTED   Candida glabrata NOT DETECTED NOT DETECTED   Candida krusei NOT DETECTED NOT DETECTED   Candida parapsilosis NOT DETECTED NOT DETECTED   Candida tropicalis NOT DETECTED NOT DETECTED   Cryptococcus neoformans/gattii NOT DETECTED NOT DETECTED   CTX-M ESBL NOT DETECTED NOT DETECTED   Carbapenem resistance IMP NOT DETECTED NOT DETECTED   Carbapenem resistance KPC NOT DETECTED NOT DETECTED   Carbapenem resistance NDM NOT DETECTED NOT DETECTED   Carbapenem resist OXA 48 LIKE NOT DETECTED NOT DETECTED   Carbapenem resistance VIM NOT DETECTED NOT DETECTED    Thank you for allowing pharmacy to be a part of this patient's care.  Georgina Pillion, PharmD, BCPS Infectious Diseases Clinical Pharmacist 05/11/2022 10:45 AM   **Pharmacist phone directory can now be found on [email or web address] ([email or web address]).  Listed under [facility].

## 2022-05-11 NOTE — Progress Notes (Signed)
Came bedside for stat echo, but patient is with IV team, then going to CT.  Please contact echo department when patient is available.

## 2022-05-11 NOTE — Progress Notes (Signed)
  Echocardiogram 2D Echocardiogram has been performed.  Kathryn Pena 05/11/2022, 5:41 PM

## 2022-05-12 ENCOUNTER — Ambulatory Visit: Payer: Medicaid Other

## 2022-05-12 ENCOUNTER — Inpatient Hospital Stay (HOSPITAL_COMMUNITY): Payer: Medicaid Other

## 2022-05-12 DIAGNOSIS — N179 Acute kidney failure, unspecified: Secondary | ICD-10-CM | POA: Diagnosis not present

## 2022-05-12 DIAGNOSIS — R6521 Severe sepsis with septic shock: Secondary | ICD-10-CM | POA: Diagnosis not present

## 2022-05-12 DIAGNOSIS — A419 Sepsis, unspecified organism: Secondary | ICD-10-CM

## 2022-05-12 DIAGNOSIS — I5021 Acute systolic (congestive) heart failure: Secondary | ICD-10-CM | POA: Diagnosis not present

## 2022-05-12 LAB — BASIC METABOLIC PANEL
Anion gap: 9 (ref 5–15)
BUN: 24 mg/dL — ABNORMAL HIGH (ref 6–20)
CO2: 22 mmol/L (ref 22–32)
Calcium: 8 mg/dL — ABNORMAL LOW (ref 8.9–10.3)
Chloride: 108 mmol/L (ref 98–111)
Creatinine, Ser: 2.44 mg/dL — ABNORMAL HIGH (ref 0.44–1.00)
GFR, Estimated: 28 mL/min — ABNORMAL LOW (ref >60)
Glucose, Bld: 110 mg/dL — ABNORMAL HIGH (ref 70–99)
Potassium: 4 mmol/L (ref 3.5–5.1)
Sodium: 139 mmol/L (ref 135–145)

## 2022-05-12 LAB — CBC
HCT: 30.3 % — ABNORMAL LOW (ref 36.0–46.0)
Hemoglobin: 10 g/dL — ABNORMAL LOW (ref 12.0–15.0)
MCH: 27.5 pg (ref 26.0–34.0)
MCHC: 33 g/dL (ref 30.0–36.0)
MCV: 83.2 fL (ref 80.0–100.0)
Platelets: 165 10*3/uL (ref 150–400)
RBC: 3.64 MIL/uL — ABNORMAL LOW (ref 3.87–5.11)
RDW: 22.5 % — ABNORMAL HIGH (ref 11.5–15.5)
WBC: 19 10*3/uL — ABNORMAL HIGH (ref 4.0–10.5)
nRBC: 0 % (ref 0.0–0.2)

## 2022-05-12 LAB — TROPONIN I (HIGH SENSITIVITY): Troponin I (High Sensitivity): 82 ng/L — ABNORMAL HIGH (ref <18)

## 2022-05-12 LAB — MAGNESIUM: Magnesium: 1.7 mg/dL (ref 1.7–2.4)

## 2022-05-12 LAB — LACTIC ACID, PLASMA: Lactic Acid, Venous: 2.1 mmol/L (ref 0.5–1.9)

## 2022-05-12 LAB — LIPASE, BLOOD: Lipase: 17 U/L (ref 11–51)

## 2022-05-12 LAB — BRAIN NATRIURETIC PEPTIDE: B Natriuretic Peptide: 451.6 pg/mL — ABNORMAL HIGH (ref 0.0–100.0)

## 2022-05-12 LAB — D-DIMER, QUANTITATIVE: D-Dimer, Quant: 6.77 ug/mL-FEU — ABNORMAL HIGH (ref 0.00–0.50)

## 2022-05-12 MED ORDER — HYDROMORPHONE HCL 1 MG/ML IJ SOLN
0.5000 mg | INTRAMUSCULAR | Status: DC | PRN
  Administered 2022-05-13 (×2): 0.5 mg via INTRAVENOUS
  Filled 2022-05-12 (×2): qty 0.5

## 2022-05-12 MED ORDER — ACETAMINOPHEN 325 MG PO TABS
650.0000 mg | ORAL_TABLET | Freq: Four times a day (QID) | ORAL | Status: DC
  Administered 2022-05-12 – 2022-05-13 (×4): 650 mg via ORAL
  Filled 2022-05-12 (×5): qty 2

## 2022-05-12 MED ORDER — LACTATED RINGERS IV BOLUS
1000.0000 mL | Freq: Once | INTRAVENOUS | Status: AC
  Administered 2022-05-12: 1000 mL via INTRAVENOUS

## 2022-05-12 MED ORDER — ENOXAPARIN SODIUM 30 MG/0.3ML IJ SOSY
30.0000 mg | PREFILLED_SYRINGE | INTRAMUSCULAR | Status: DC
  Administered 2022-05-13: 30 mg via SUBCUTANEOUS
  Filled 2022-05-12: qty 0.3

## 2022-05-12 MED ORDER — OXYCODONE HCL 5 MG PO TABS
5.0000 mg | ORAL_TABLET | Freq: Four times a day (QID) | ORAL | Status: DC | PRN
  Administered 2022-05-12 – 2022-05-14 (×5): 5 mg via ORAL
  Filled 2022-05-12 (×5): qty 1

## 2022-05-12 MED ORDER — ACETAMINOPHEN 325 MG PO TABS
650.0000 mg | ORAL_TABLET | Freq: Three times a day (TID) | ORAL | Status: DC | PRN
  Administered 2022-05-12: 650 mg via ORAL
  Filled 2022-05-12: qty 2

## 2022-05-12 NOTE — Consult Note (Signed)
NAME:  Kathryn Pena, MRN:  [id number], DOB:  2001/08/07, LOS: 1 ADMISSION DATE:  05/10/2022, CONSULTATION DATE:  05/11/2022 REFERRING MD:  Dr. Allena Katz - [facility] , CHIEF COMPLAINT: Sepsis  History of Present Illness:  Kathryn Pena is a 21 year old female with a past medical history for pregnancy-induced hypertension, recent group B streptococcus UTI, and is currently 1 month postpartum who presented to the emergency department 7/29 for complaints of acute onset back pain.  Per H&P patient was complaining of left-sided back pain that began 2 days prior to admission.  Back pain was also associated with dysuria and not relieved with over-the-counter medications.  Patient was admitted per hospitalist services for sepsis work-up where she was found to be E. coli bacteremia from likely pyelonephritis.  Work-up in ED included MRI cervical spine with possible left-sided pyelonephritis.  Afternoon of 7/30 PCCM called for worsening septic like picture.  Patient has had progressive hypotension with associated tachycardia and elevation and fever to Tmax 102.1.   Pertinent  Medical History  Pregnancy-induced hypertension, recent group B streptococcus UTI, and is currently 1 month postpartum  Significant Hospital Events: Including procedures, antibiotic start and stop dates in addition to other pertinent events   7/29 patient presented with left-sided back pain was found to have E. coli bacteremia likely in the setting of left-sided pyelonephritis seen on MRI 7/30 worsening septic shock including hypotension, tachycardia, and elevated fever  Interim History / Subjective:  No events overnight. Symptoms are improved. Patient still endorses nausea, and left costovertebral angle tenderness that's improved. Remains tachycardic, taychepnic and hypotensive.   Objective   Blood pressure (!) 87/44, pulse (!) 124, temperature 99.1 F (37.3 C), temperature source Oral, resp. rate (!) 29, last menstrual period  06/18/2021, SpO2 96 %, currently breastfeeding.        Intake/Output Summary (Last 24 hours) at 05/12/2022 0826 Last data filed at 05/12/2022 0630 Gross per 24 hour  Intake 775.71 ml  Output 1850 ml  Net -1074.29 ml   There were no vitals filed for this visit.  Examination: General: Non-toxic appearing, awake and NAD HEENT: MMM, PERRL Neuro: A & O x3, No focal neuro deficit   CV: Regular Rhythm, Tachycardic, No murmurs PULM: CTAB, intubated, No wheezing or crackles  GI: soft, +BS, non-distended Extremities: No BLE edema, well perfused  Skin: no rashes or lesions  Resolved Hospital Problem list     Assessment & Plan:  Evolving severe sepsis likely secondary to left pyelonephritis with known bacteremia Recent group B streptococcus UTI Blood culture positive for enterobacter and E.coli  Febrile overnight but this morning afebrile. Still tachycardic to 120-130s and tachypneic with intermittent hypotension. Still Leukocytosis with WBC trending up. P: Supplemental oxygen as needed for SPO2 greater than 92 Continue antibiotics of CTX  Give LR bolus Monitor urine output Levophed, MAP goal >65  Acute kidney injury AKI likely prerenal in the setting of on going sepsis.  Worsening creatinine overnight with Cr of 2.44 (1.25 on admission). GFR is 28. P: Monitor renal function Monitor urine output Avoid nephrotoxic agents Continue IV hydration  Gestational hypertension Echo showed global hypokinesis of LV with EF of 30-35% P: Close monitoring of hemodynamics in the ICU setting Obtain echocardiogram Continuous telemetry  Chronic microcytic anemia P: Hgb stable at 10. Asymptomatic  Trend CBC Transfuse <7 per protocol   1 month postpartum and breast-feeding P: Coordinate with pharmacy to prescribe breast feeding safe medications as able  Hypomagnesemia P: Supplement Trend  Best Practice (right  click and "Reselect all SmartList Selections" daily)   Diet/type:  Regular consistency (see orders) DVT prophylaxis: LMWH GI prophylaxis: PPI Lines: N/A Foley:  N/A Code Status:  full code Last date of multidisciplinary goals of care discussion: Patient updated at bedside    Jerre Simon, MD PGY-2, [facility] Resident  Please page [phone] with questions.

## 2022-05-12 NOTE — Progress Notes (Addendum)
Responded to 1L bolus this morning- off pressors for the past few hours and she has gotten up to go to the bathroom, which is hard for her due to fatigue. She has been more tachycardic in 130s since getting back in bed. Sinus tach on tele. Temp currently 103- tylenol to be given.  She declined breast feeding today, offered x 2 today.  BP (!) 128/95   Pulse (!) 136   Temp 99.7 F (37.6 C) (Oral)   Resp (!) 28   LMP 06/18/2021 (Approximate)   SpO2 97%   Breastfeeding Yes    Mother updated at bedside.   Steffanie Dunn, DO 05/12/22 1:59 PM [facility]

## 2022-05-12 NOTE — Plan of Care (Signed)

## 2022-05-13 ENCOUNTER — Inpatient Hospital Stay (HOSPITAL_COMMUNITY): Payer: Medicaid Other

## 2022-05-13 DIAGNOSIS — R1011 Right upper quadrant pain: Secondary | ICD-10-CM

## 2022-05-13 DIAGNOSIS — A419 Sepsis, unspecified organism: Secondary | ICD-10-CM | POA: Diagnosis not present

## 2022-05-13 DIAGNOSIS — I5021 Acute systolic (congestive) heart failure: Secondary | ICD-10-CM | POA: Diagnosis not present

## 2022-05-13 DIAGNOSIS — N12 Tubulo-interstitial nephritis, not specified as acute or chronic: Secondary | ICD-10-CM | POA: Diagnosis not present

## 2022-05-13 DIAGNOSIS — R609 Edema, unspecified: Secondary | ICD-10-CM | POA: Diagnosis not present

## 2022-05-13 DIAGNOSIS — N179 Acute kidney failure, unspecified: Secondary | ICD-10-CM | POA: Diagnosis not present

## 2022-05-13 LAB — CULTURE, BLOOD (ROUTINE X 2)
Special Requests: ADEQUATE
Special Requests: ADEQUATE

## 2022-05-13 LAB — BASIC METABOLIC PANEL
Anion gap: 8 (ref 5–15)
BUN: 17 mg/dL (ref 6–20)
CO2: 23 mmol/L (ref 22–32)
Calcium: 8.3 mg/dL — ABNORMAL LOW (ref 8.9–10.3)
Chloride: 105 mmol/L (ref 98–111)
Creatinine, Ser: 1.94 mg/dL — ABNORMAL HIGH (ref 0.44–1.00)
GFR, Estimated: 37 mL/min — ABNORMAL LOW (ref >60)
Glucose, Bld: 100 mg/dL — ABNORMAL HIGH (ref 70–99)
Potassium: 3.5 mmol/L (ref 3.5–5.1)
Sodium: 136 mmol/L (ref 135–145)

## 2022-05-13 LAB — CBC
HCT: 31.3 % — ABNORMAL LOW (ref 36.0–46.0)
Hemoglobin: 10.3 g/dL — ABNORMAL LOW (ref 12.0–15.0)
MCH: 27.3 pg (ref 26.0–34.0)
MCHC: 32.9 g/dL (ref 30.0–36.0)
MCV: 83 fL (ref 80.0–100.0)
Platelets: 174 10*3/uL (ref 150–400)
RBC: 3.77 MIL/uL — ABNORMAL LOW (ref 3.87–5.11)
RDW: 22.4 % — ABNORMAL HIGH (ref 11.5–15.5)
WBC: 13.1 10*3/uL — ABNORMAL HIGH (ref 4.0–10.5)
nRBC: 0 % (ref 0.0–0.2)

## 2022-05-13 LAB — URINE CULTURE: Culture: >=100000 — AB

## 2022-05-13 LAB — MAGNESIUM: Magnesium: 2.1 mg/dL (ref 1.7–2.4)

## 2022-05-13 MED ORDER — ENOXAPARIN SODIUM 40 MG/0.4ML IJ SOSY
40.0000 mg | PREFILLED_SYRINGE | INTRAMUSCULAR | Status: DC
  Administered 2022-05-14: 40 mg via SUBCUTANEOUS
  Filled 2022-05-13: qty 0.4

## 2022-05-13 MED ORDER — POTASSIUM CHLORIDE CRYS ER 20 MEQ PO TBCR
40.0000 meq | EXTENDED_RELEASE_TABLET | Freq: Once | ORAL | Status: AC
  Administered 2022-05-13: 40 meq via ORAL
  Filled 2022-05-13: qty 2

## 2022-05-13 MED ORDER — ACETAMINOPHEN 325 MG PO TABS
650.0000 mg | ORAL_TABLET | Freq: Four times a day (QID) | ORAL | Status: AC
  Administered 2022-05-13 – 2022-05-14 (×4): 650 mg via ORAL
  Filled 2022-05-13 (×4): qty 2

## 2022-05-13 NOTE — Progress Notes (Signed)
Lower extremity venous bilateral study completed.   Please see CV Proc for preliminary results.   Jaimin Krupka, RDMS, RVT  

## 2022-05-13 NOTE — Progress Notes (Addendum)
NAME:  Kathryn Pena, MRN:  [id number], DOB:  Apr 10, 2001, LOS: 2 ADMISSION DATE:  05/10/2022, CONSULTATION DATE:  05/11/2022 REFERRING MD:  Dr. Allena Katz - [facility], CHIEF COMPLAINT:  sepsis   History of Present Illness:  Kathryn Pena is a 21 year old female with a past medical history for pregnancy-induced hypertension, recent group B streptococcus UTI, and is currently 1 month postpartum who presented to the emergency department 7/29 for complaints of acute onset back pain.  Per H&P patient was complaining of left-sided back pain that began 2 days prior to admission.  Back pain was also associated with dysuria and not relieved with over-the-counter medications.  Patient was admitted per hospitalist services for sepsis work-up where she was found to be E. coli bacteremia from likely pyelonephritis.  Work-up in ED included MRI cervical spine with possible left-sided pyelonephritis.   Afternoon of 7/30 PCCM called for worsening septic like picture.  Patient has had progressive hypotension with associated tachycardia and elevation and fever to Tmax 102.1.   Pertinent  Medical History  Pregnancy-induced hypertension, recent group B streptococcus UTI, and is currently 1 month postpartum  Significant Hospital Events: Including procedures, antibiotic start and stop dates in addition to other pertinent events   7/29 patient presented with left-sided back pain was found to have E. coli bacteremia likely in the setting of left-sided pyelonephritis seen on MRI 7/30 worsening septic shock including hypotension, tachycardia, and elevated fever  Interim History / Subjective:  No acute events overnight. Symptoms improving. Still having CVA tenderness. Urinating without issues. Able to ambulate to restroom. Some SOB. CP when coughing.   Objective   Blood pressure 104/62, pulse (!) 122, temperature 99.3 F (37.4 C), temperature source Oral, resp. rate (!) 21, weight 105.2 kg, last menstrual period  06/18/2021, SpO2 92 %, currently breastfeeding.        Intake/Output Summary (Last 24 hours) at 05/13/2022 1006 Last data filed at 05/13/2022 2951 Gross per 24 hour  Intake 1526.95 ml  Output 3325 ml  Net -1798.05 ml   Filed Weights   05/13/22 0230  Weight: 105.2 kg    Examination: General: Non-toxic appearing, awake and NAD  HEENT: MMM, PERRL Neuro: A & O x3, No focal neuro deficit   CV: Regular Rhythm, Tachycardic, No murmurs PULM: CTAB, No wheezing or crackles  GI: soft, +BS, non-distended, CVA TTP Extremities: Trace BLE edema, well perfused  MSK: chest wall TTP Skin: no rashes or lesions  Resolved Hospital Problem list     Assessment & Plan:   Sepsis likely secondary to left pyelonephritis with known bacteremia Recent group B streptococcus UTI Blood culture positive for enterobacter and E.coli. Repeat Bcx NGTD.  Febrile overnight 102. Still tachycardic 110's with intermittent spikes to 120-130s and tachypneic with intermittent hypotension, currently stable MAP 76 on midodrine 5mg . Still Leukocytosis, though WBC improving. Intermittent fever up to 102. P: Supplemental oxygen as needed for SPO2 greater than 92 - Continue antibiotics of CTX  - Follow cultures - stop midodrine - Monitor urine output - Renal due to concern for possible perinephric abscess  Lactic acidosis Improving s/p IVF   Acute kidney injury AKI likely prerenal in the setting of on going sepsis.  UOP, Cr, and GFR improving.  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 Mild troponin elevation in the setting of sepsis. BNP elevation 2/2 acute  cardiomyopathy. Tolerated fluid trial. No evidence of volume overload.  - periodically trend BNP - telemtry monitoring   Chronic microcytic anemia P: Hgb stable. Asymptomatic  - daily CBC - Transfuse <7 per  protocol   RUQ abd pain - possibly acalculous cholecystitis in the setting of recent pregnancy.   - RUQ [place]  1 month postpartum and breast-feeding P: - pump and dump until safe to use breastmilk from lactation consultant perspective - prenatal vitamins - maintain hydration   Hypomagnesemia Stable.  P: - Trend - supplement as necessary  Best Practice (right click and "Reselect all SmartList Selections" daily)   Diet/type: Regular consistency (see orders) DVT prophylaxis: other Lovenox GI prophylaxis: Zofran, protonix, miralax Lines: N/A Foley:  N/A Code Status:  full code Last date of multidisciplinary goals of care discussion [Patient updated bedside]  Labs   CBC: Recent Labs  Lab 05/10/22 2037 05/11/22 0610 05/12/22 0613 05/13/22 0103  WBC 17.8* 15.7* 19.0* 13.1*  NEUTROABS 16.4* 14.7*  --   --   HGB 11.3* 10.8* 10.0* 10.3*  HCT 35.6* 33.2* 30.3* 31.3*  MCV 85.8 84.5 83.2 83.0  PLT 232 206 165 [id number]    Basic Metabolic Panel: Recent Labs  Lab 05/10/22 2037 05/11/22 0610 05/11/22 1500 05/12/22 0613 05/13/22 0103  NA 139 136 136 139 136  K 3.5 3.9 3.7 4.0 3.5  CL 108 105 104 108 105  CO2 23 23 21* 22 23  GLUCOSE 101* 137* 112* 110* 100*  BUN 12 15 19  24* 17  CREATININE 1.25* 1.90* 2.18* 2.44* 1.94*  CALCIUM 8.6* 8.2* 8.0* 8.0* 8.3*  MG  --  1.2* 1.8 1.7 2.1  PHOS  --  2.5  --   --   --    GFR: Estimated Creatinine Clearance: 57.7 mL/min (A) (by C-G formula based on SCr of 1.94 mg/dL (H)). Recent Labs  Lab 05/10/22 2037 05/11/22 0610 05/11/22 1500 05/12/22 0613 05/12/22 1142 05/13/22 0103  WBC 17.8* 15.7*  --  19.0*  --  13.1*  LATICACIDVEN 1.2  --  2.7*  --  2.1*  --     Liver Function Tests: Recent Labs  Lab 05/10/22 2037 05/11/22 0610  AST 18 19  ALT 17 17  ALKPHOS 68 62  BILITOT 1.2 0.6  PROT 6.5 6.0*  ALBUMIN 3.3* 2.8*   Recent Labs  Lab 05/11/22 1500 05/12/22 1142  LIPASE 20 17   No results for input(s): "AMMONIA" in the  last 168 hours.  ABG No results found for: "PHART", "PCO2ART", "PO2ART", "HCO3", "TCO2", "ACIDBASEDEF", "O2SAT"   Coagulation Profile: Recent Labs  Lab 05/10/22 2037  INR 1.1    Cardiac Enzymes: No results for input(s): "CKTOTAL", "CKMB", "CKMBINDEX", "TROPONINI" in the last 168 hours.  HbA1C: No results found for: "HGBA1C"  CBG: Recent Labs  Lab 05/11/22 1621  GLUCAP 100*    Review of Systems:     Past Medical History:  She,  has a past medical history of Pregnancy induced hypertension.   Surgical History:   Past Surgical History:  Procedure Laterality Date   NO PAST SURGERIES       Social History:   reports that she quit smoking about 3 years ago. Her smoking use included cigars. She has never used smokeless tobacco. She reports that she does not drink alcohol and does not use drugs.   Family History:  Her family history includes Healthy in her mother; Hypertension in her maternal aunt. There is no history of Diabetes, Heart disease, or  Stroke.   Allergies No Known Allergies   Home Medications  Prior to Admission medications   Medication Sig Start Date End Date Taking? Authorizing Provider  acetaminophen (TYLENOL) 500 MG tablet Take 500 mg by mouth every 6 (six) hours as needed for moderate pain.   Yes [provider]  ibuprofen (ADVIL) 200 MG tablet Take 200 mg by mouth every 6 (six) hours as needed for moderate pain.   Yes [provider]  NIFEdipine (ADALAT CC) 30 MG 24 hr tablet Take 1 tablet (30 mg total) by mouth daily. 04/16/22  Yes Levie Heritage, DO  acetaminophen (TYLENOL) 500 MG tablet Take 2 tablets (1,000 mg total) by mouth every 6 (six) hours as needed for moderate pain. Patient not taking: Reported on 05/10/2022 09/10/21   Donette Larry, CNM  furosemide (LASIX) 20 MG tablet Take 1 tablet (20 mg total) by mouth daily for 4 days. 04/14/22 04/18/22  Carlynn Herald, CNM  ibuprofen (ADVIL) 600 MG tablet Take 1 tablet (600 mg  total) by mouth every 6 (six) hours as needed. Patient not taking: Reported on 05/10/2022 04/14/22   Carlynn Herald, CNM  ibuprofen (ADVIL) 600 MG tablet Take 1 tablet (600 mg total) by mouth every 6 (six) hours. Patient not taking: Reported on 05/10/2022 04/15/22   Levie Heritage, DO  Prenatal Vit-Fe Phos-FA-Omega (VITAFOL GUMMIES) 3.33-0.333-34.8 MG CHEW Chew 1 tablet by mouth daily. Patient not taking: Reported on 05/10/2022 09/02/21   Constant, Peggy, MD  enalapril (VASOTEC) 5 MG tablet Take 1 tablet (5 mg total) by mouth daily. 02/24/19 12/21/19  Hermina Staggers, MD     Critical care time: 66

## 2022-05-13 NOTE — TOC Progression Note (Signed)
Transition of Care [facility]) - Progression Note    Patient Details  Name: HELENA SARDO MRN: [id number] Date of Birth: April 13, 2001  Transition of Care (TOC) CM/SW Contact  Beckie Busing, RN Phone [phone]  05/13/2022, 4:05 PM  Clinical Narrative:     Transition of Care (TOC) Screening Note   Patient Details  Name: CONSTANCE WHITTLE Date of Birth: 06/14/2001   Transition of Care [facility]) CM/SW Contact:    Beckie Busing, RN Phone Number: 05/13/2022, 4:06 PM    Transition of Care Department [facility]) has reviewed patient and no TOC needs have been identified at this time. We will continue to monitor patient advancement through interdisciplinary progression rounds.           Expected Discharge Plan and Services                                                 Social Determinants of Health (SDOH) Interventions    Readmission Risk Interventions     No data to display

## 2022-05-13 NOTE — Progress Notes (Signed)
[facility] ADULT ICU REPLACEMENT PROTOCOL   The patient does apply for the [facility] Adult ICU Electrolyte Replacment Protocol based on the criteria listed below:   1.Exclusion criteria: TCTS patients, ECMO patients, and Dialysis patients 2. Is GFR >/= 30 ml/min? Yes.    Patient's GFR today is 37 3. Is SCr </= 2? Yes.   Patient's SCr is 1.94 mg/dL 4. Did SCr increase >/= 0.5 in 24 hours? No. 5.Pt's weight >40kg  Yes.   6. Abnormal electrolyte(s): K+ 3.5  7. Electrolytes replaced per protocol 8.  Call MD STAT for K+ </= 2.5, Phos </= 1, or Mag </= 1 Physician:  Tito Dine 05/13/2022 2:24 AM

## 2022-05-14 ENCOUNTER — Inpatient Hospital Stay (HOSPITAL_COMMUNITY): Payer: Medicaid Other

## 2022-05-14 DIAGNOSIS — R7881 Bacteremia: Secondary | ICD-10-CM | POA: Diagnosis not present

## 2022-05-14 DIAGNOSIS — B9620 Unspecified Escherichia coli [E. coli] as the cause of diseases classified elsewhere: Secondary | ICD-10-CM

## 2022-05-14 DIAGNOSIS — R000 Tachycardia, unspecified: Secondary | ICD-10-CM

## 2022-05-14 DIAGNOSIS — N179 Acute kidney failure, unspecified: Secondary | ICD-10-CM | POA: Diagnosis not present

## 2022-05-14 DIAGNOSIS — E876 Hypokalemia: Secondary | ICD-10-CM

## 2022-05-14 DIAGNOSIS — N12 Tubulo-interstitial nephritis, not specified as acute or chronic: Secondary | ICD-10-CM | POA: Diagnosis not present

## 2022-05-14 LAB — BASIC METABOLIC PANEL
Anion gap: 7 (ref 5–15)
BUN: 11 mg/dL (ref 6–20)
CO2: 23 mmol/L (ref 22–32)
Calcium: 8.2 mg/dL — ABNORMAL LOW (ref 8.9–10.3)
Chloride: 106 mmol/L (ref 98–111)
Creatinine, Ser: 1.61 mg/dL — ABNORMAL HIGH (ref 0.44–1.00)
GFR, Estimated: 46 mL/min — ABNORMAL LOW (ref >60)
Glucose, Bld: 95 mg/dL (ref 70–99)
Potassium: 3.4 mmol/L — ABNORMAL LOW (ref 3.5–5.1)
Sodium: 136 mmol/L (ref 135–145)

## 2022-05-14 LAB — CBC
HCT: 28.3 % — ABNORMAL LOW (ref 36.0–46.0)
Hemoglobin: 9.3 g/dL — ABNORMAL LOW (ref 12.0–15.0)
MCH: 27.1 pg (ref 26.0–34.0)
MCHC: 32.9 g/dL (ref 30.0–36.0)
MCV: 82.5 fL (ref 80.0–100.0)
Platelets: 168 10*3/uL (ref 150–400)
RBC: 3.43 MIL/uL — ABNORMAL LOW (ref 3.87–5.11)
RDW: 22.8 % — ABNORMAL HIGH (ref 11.5–15.5)
WBC: 8.9 10*3/uL (ref 4.0–10.5)
nRBC: 0 % (ref 0.0–0.2)

## 2022-05-14 LAB — MAGNESIUM: Magnesium: 2 mg/dL (ref 1.7–2.4)

## 2022-05-14 MED ORDER — ACETAMINOPHEN 500 MG PO TABS
1000.0000 mg | ORAL_TABLET | Freq: Four times a day (QID) | ORAL | Status: DC | PRN
  Administered 2022-05-14 – 2022-05-15 (×2): 1000 mg via ORAL
  Filled 2022-05-14 (×2): qty 2

## 2022-05-14 MED ORDER — MORPHINE SULFATE (PF) 2 MG/ML IV SOLN
1.0000 mg | INTRAVENOUS | Status: DC | PRN
  Administered 2022-05-15: 2 mg via INTRAVENOUS
  Filled 2022-05-14 (×2): qty 1

## 2022-05-14 MED ORDER — TECHNETIUM TO 99M ALBUMIN AGGREGATED
4.2000 | Freq: Once | INTRAVENOUS | Status: AC | PRN
  Administered 2022-05-14: 4.2 via INTRAVENOUS

## 2022-05-14 MED ORDER — ENOXAPARIN SODIUM 60 MG/0.6ML IJ SOSY
50.0000 mg | PREFILLED_SYRINGE | INTRAMUSCULAR | Status: DC
  Administered 2022-05-15 – 2022-05-17 (×3): 50 mg via SUBCUTANEOUS
  Filled 2022-05-14: qty 0.5
  Filled 2022-05-14: qty 0.6
  Filled 2022-05-14: qty 0.5

## 2022-05-14 MED ORDER — POTASSIUM CHLORIDE 10 MEQ/100ML IV SOLN
10.0000 meq | INTRAVENOUS | Status: DC
  Administered 2022-05-14: 10 meq via INTRAVENOUS
  Filled 2022-05-14 (×2): qty 100

## 2022-05-14 MED ORDER — POTASSIUM CHLORIDE 10 MEQ/100ML IV SOLN
10.0000 meq | INTRAVENOUS | Status: AC
  Administered 2022-05-14 (×4): 10 meq via INTRAVENOUS
  Filled 2022-05-14 (×3): qty 100

## 2022-05-14 MED ORDER — ACETAMINOPHEN 500 MG PO TABS: 1000.0000 mg | ORAL_TABLET | ORAL | Status: DC | PRN

## 2022-05-14 MED ORDER — OXYCODONE HCL 5 MG PO TABS
5.0000 mg | ORAL_TABLET | ORAL | Status: AC | PRN
  Administered 2022-05-14 – 2022-05-17 (×12): 5 mg via ORAL
  Filled 2022-05-14 (×12): qty 1

## 2022-05-14 MED ORDER — ENOXAPARIN SODIUM 60 MG/0.6ML IJ SOSY: 50.0000 mg | PREFILLED_SYRINGE | INTRAMUSCULAR | Status: DC

## 2022-05-14 MED ORDER — ENOXAPARIN SODIUM 60 MG/0.6ML IJ SOSY
60.0000 mg | PREFILLED_SYRINGE | Freq: Once | INTRAMUSCULAR | Status: AC
  Administered 2022-05-14: 60 mg via SUBCUTANEOUS
  Filled 2022-05-14 (×2): qty 0.6

## 2022-05-14 MED ORDER — ENOXAPARIN SODIUM 100 MG/ML IJ SOSY
100.0000 mg | PREFILLED_SYRINGE | Freq: Two times a day (BID) | INTRAMUSCULAR | Status: DC
  Filled 2022-05-14: qty 1

## 2022-05-14 MED ORDER — ACETAMINOPHEN 325 MG PO TABS: 650.0000 mg | ORAL_TABLET | ORAL | Status: DC | PRN

## 2022-05-14 NOTE — Progress Notes (Signed)
eLink Physician-Brief Progress Note Patient Name: Kathryn Pena DOB: [birth year] MRN: [id number]   Date of Service  05/14/2022  HPI/Events of Note  Patient with sub-optimal analgesia, she also does not like the way  Dilaudid makes her feel.  eICU Interventions  Dilaudid discontinued, Oxycodone interval reduced to Q 4 hours PRN pain,        Taishawn Smaldone U Melyssa Signor 05/14/2022, 6:31 AM

## 2022-05-14 NOTE — Progress Notes (Addendum)
NAME:  Kathryn Pena, MRN:  [id number], DOB:  10-06-2001, LOS: 3 ADMISSION DATE:  05/10/2022, CONSULTATION DATE:  05/11/2022 REFERRING MD:  Dr. Allena Katz - [facility], CHIEF COMPLAINT:  sepsis   History of Present Illness:  Kathryn Pena is a 21 year old female with a past medical history for pregnancy-induced hypertension, recent group B streptococcus UTI, and is currently 1 month postpartum who presented to the emergency department 7/29 for complaints of acute onset back pain.  Per H&P patient was complaining of left-sided back pain that began 2 days prior to admission.  Back pain was also associated with dysuria and not relieved with over-the-counter medications.  Patient was admitted per hospitalist services for sepsis work-up where she was found to be E. coli bacteremia from likely pyelonephritis.  Work-up in ED included MRI cervical spine with possible left-sided pyelonephritis.   Afternoon of 7/30 PCCM called for worsening septic like picture.  Patient has had progressive hypotension with associated tachycardia and elevation and fever to Tmax 102.1.   Pertinent  Medical History  Pregnancy-induced hypertension, recent group B streptococcus UTI, and is currently 1 month postpartum  Significant Hospital Events: Including procedures, antibiotic start and stop dates in addition to other pertinent events   7/29 patient presented with left-sided back pain was found to have E. coli bacteremia likely in the setting of left-sided pyelonephritis seen on MRI 7/30 worsening septic shock including hypotension, tachycardia, and elevated fever  Interim History / Subjective:  No acute events overnight. Appetite has been poor. Nausea. Is tolerating fluids. No difficulty moving bowel or bladder. Endorses back pain. Has been able to get up to ambulate to restroom, but still having SOB.   Objective   Blood pressure (!) 96/58, pulse (!) 120, temperature (!) 101.1 F (38.4 C), temperature source Oral, resp.  rate (!) 25, weight 105.2 kg, last menstrual period 06/18/2021, SpO2 92 %, currently breastfeeding.        Intake/Output Summary (Last 24 hours) at 05/14/2022 3220 Last data filed at 05/14/2022 2542 Gross per 24 hour  Intake 960 ml  Output 3550 ml  Net -2590 ml   Filed Weights   05/13/22 0230  Weight: 105.2 kg    Examination: General: Non-toxic appearing, awake and NAD  HEENT: MMM, PERRL Neuro: A & O x3, No focal neuro deficit   CV: Regular Rhythm, Tachycardic, No murmurs PULM: CTAB, No wheezing or crackles  GI: soft, +BS, non-distended, CVA TTP Extremities: No BLE edema, well perfused  Skin: no rashes or lesions  Resolved Hospital Problem list   Lactic acidosis  Assessment & Plan:   Sepsis likely secondary to left pyelonephritis with known bacteremia Recent group B streptococcus UTI Blood culture positive for enterobacter and E.coli. Repeat Bcx NGTD. Shock has resolved. Intermittent fevers up to 102 yesterday evening and persistent tachycardia, but this does seem to be down-trending. No evidence of renal abscess or cholecystitis to explain recurrent fevers. P: - Continue antibiotics of CTX day 4/7 for bacteremia - Follow cultures - Monitor urine output - CXR to evaluate for pleural effusion given ongoing fever - VQ scan to rule out PE given tachycardia and fever. If VQ negative, consider ID consult  Acute kidney injury ATN due to sepsis L pyelonephritis R Hydronephrosis AKI likely prerenal with subsequent ATN due to sepsis. Sepsis has resolved and UOP, Cr, and GFR improving.  R hydronephrosis likely related to pregnancy, and not pathologic.  - Strict I+Os - daily BMP - Avoid nephrotoxic agents - encourage PO hydration -  consider nephro consult for hydronephrosis  Acute HFrEF, likely due to sepsis, less likely post-partum cardiomyopathy No evidence of ACS on EKG. Mild troponin elevation in the setting of sepsis. BNP elevation 2/2 acute cardiomyopathy.  -  periodically trend BNP - telemtry monitoring - may require GDMT for full return of cardiac function.   Chronic microcytic anemia Hgb stable. Asymptomatic  - daily CBC - Transfuse <7 per protocol  Hypocalcemia Stable - monitor - replete as necessary  Hypokalemia - replete - monitor daily   RUQ abd pain No evidence of cholecystitis on RUQ and negative murphys sign. Hepatomegaly seen on CT.  - continue to monitor.   1 month postpartum and breast-feeding - pump and dump until safe to use breastmilk from Advertising copywriter perspective - prenatal vitamins - maintain hydration   Hypomagnesemia Stable.  - Trend - supplement as necessary  Best Practice (right click and "Reselect all SmartList Selections" daily)   Diet/type: Regular consistency (see orders) DVT prophylaxis: other Lovenox GI prophylaxis: Zofran, protonix, miralax Lines: N/A Foley:  N/A Code Status:  full code Last date of multidisciplinary goals of care discussion [Patient updated bedside]  Labs   CBC: Recent Labs  Lab 05/10/22 2037 05/11/22 0610 05/12/22 0613 05/13/22 0103 05/14/22 0603  WBC 17.8* 15.7* 19.0* 13.1* 8.9  NEUTROABS 16.4* 14.7*  --   --   --   HGB 11.3* 10.8* 10.0* 10.3* 9.3*  HCT 35.6* 33.2* 30.3* 31.3* 28.3*  MCV 85.8 84.5 83.2 83.0 82.5  PLT 232 206 165 174 168    Basic Metabolic Panel: Recent Labs  Lab 05/11/22 0610 05/11/22 1500 05/12/22 0613 05/13/22 0103 05/14/22 0603  NA 136 136 139 136 136  K 3.9 3.7 4.0 3.5 3.4*  CL 105 104 108 105 106  CO2 23 21* 22 23 23   GLUCOSE 137* 112* 110* 100* 95  BUN 15 19 24* 17 11  CREATININE 1.90* 2.18* 2.44* 1.94* 1.61*  CALCIUM 8.2* 8.0* 8.0* 8.3* 8.2*  MG 1.2* 1.8 1.7 2.1 2.0  PHOS 2.5  --   --   --   --    GFR: Estimated Creatinine Clearance: 69.6 mL/min (A) (by C-G formula based on SCr of 1.61 mg/dL (H)). Recent Labs  Lab 05/10/22 2037 05/11/22 0610 05/11/22 1500 05/12/22 0613 05/12/22 1142 05/13/22 0103  05/14/22 0603  WBC 17.8* 15.7*  --  19.0*  --  13.1* 8.9  LATICACIDVEN 1.2  --  2.7*  --  2.1*  --   --     Liver Function Tests: Recent Labs  Lab 05/10/22 2037 05/11/22 0610  AST 18 19  ALT 17 17  ALKPHOS 68 62  BILITOT 1.2 0.6  PROT 6.5 6.0*  ALBUMIN 3.3* 2.8*   Recent Labs  Lab 05/11/22 1500 05/12/22 1142  LIPASE 20 17   No results for input(s): "AMMONIA" in the last 168 hours.  ABG No results found for: "PHART", "PCO2ART", "PO2ART", "HCO3", "TCO2", "ACIDBASEDEF", "O2SAT"   Coagulation Profile: Recent Labs  Lab 05/10/22 2037  INR 1.1    Cardiac Enzymes: No results for input(s): "CKTOTAL", "CKMB", "CKMBINDEX", "TROPONINI" in the last 168 hours.  HbA1C: No results found for: "HGBA1C"  CBG: Recent Labs  Lab 05/11/22 1621  GLUCAP 100*    Review of Systems:     Past Medical History:  She,  has a past medical history of Pregnancy induced hypertension.   Surgical History:   Past Surgical History:  Procedure Laterality Date   NO PAST SURGERIES  Social History:   reports that she quit smoking about 3 years ago. Her smoking use included cigars. She has never used smokeless tobacco. She reports that she does not drink alcohol and does not use drugs.   Family History:  Her family history includes Healthy in her mother; Hypertension in her maternal aunt. There is no history of Diabetes, Heart disease, or Stroke.   Allergies Allergies  Allergen Reactions   Dilaudid [Hydromorphone Hcl] Nausea Only, Anxiety and Other (See Comments)    Nightmares, feeling of heart 'racing'     Home Medications  Prior to Admission medications   Medication Sig Start Date End Date Taking? Authorizing Provider  acetaminophen (TYLENOL) 500 MG tablet Take 500 mg by mouth every 6 (six) hours as needed for moderate pain.   Yes [provider]  ibuprofen (ADVIL) 200 MG tablet Take 200 mg by mouth every 6 (six) hours as needed for moderate pain.   Yes [provider]  NIFEdipine (ADALAT CC) 30 MG 24 hr tablet Take 1 tablet (30 mg total) by mouth daily. 04/16/22  Yes Levie Heritage, DO  acetaminophen (TYLENOL) 500 MG tablet Take 2 tablets (1,000 mg total) by mouth every 6 (six) hours as needed for moderate pain. Patient not taking: Reported on 05/10/2022 09/10/21   Donette Larry, CNM  furosemide (LASIX) 20 MG tablet Take 1 tablet (20 mg total) by mouth daily for 4 days. 04/14/22 04/18/22  Carlynn Herald, CNM  ibuprofen (ADVIL) 600 MG tablet Take 1 tablet (600 mg total) by mouth every 6 (six) hours as needed. Patient not taking: Reported on 05/10/2022 04/14/22   Carlynn Herald, CNM  ibuprofen (ADVIL) 600 MG tablet Take 1 tablet (600 mg total) by mouth every 6 (six) hours. Patient not taking: Reported on 05/10/2022 04/15/22   Levie Heritage, DO  Prenatal Vit-Fe Phos-FA-Omega (VITAFOL GUMMIES) 3.33-0.333-34.8 MG CHEW Chew 1 tablet by mouth daily. Patient not taking: Reported on 05/10/2022 09/02/21   Constant, Peggy, MD  enalapril (VASOTEC) 5 MG tablet Take 1 tablet (5 mg total) by mouth daily. 02/24/19 12/21/19  Hermina Staggers, MD     Critical care time: 69

## 2022-05-14 NOTE — Progress Notes (Addendum)
eLink Physician-Brief Progress Note Patient Name: Kathryn Pena DOB: Jun 13, 2001 MRN: [id number]   Date of Service  05/14/2022  HPI/Events of Note  Patient with temperature of 102.8 and  pain not controlled by Robaxin and Oxycodone.  eICU Interventions  Tylenol 1 gm po Q 6 hours PRN pain and / or fever ordered. PRN iv Morphine ordered for breakthrough pain.        [name] Early Ord 05/14/2022, 8:52 PM

## 2022-05-15 ENCOUNTER — Inpatient Hospital Stay (HOSPITAL_COMMUNITY): Payer: Medicaid Other

## 2022-05-15 DIAGNOSIS — R7881 Bacteremia: Secondary | ICD-10-CM | POA: Diagnosis not present

## 2022-05-15 DIAGNOSIS — B9629 Other Escherichia coli [E. coli] as the cause of diseases classified elsewhere: Secondary | ICD-10-CM

## 2022-05-15 DIAGNOSIS — N179 Acute kidney failure, unspecified: Secondary | ICD-10-CM

## 2022-05-15 DIAGNOSIS — R6520 Severe sepsis without septic shock: Secondary | ICD-10-CM

## 2022-05-15 DIAGNOSIS — I5021 Acute systolic (congestive) heart failure: Secondary | ICD-10-CM | POA: Diagnosis not present

## 2022-05-15 DIAGNOSIS — N12 Tubulo-interstitial nephritis, not specified as acute or chronic: Secondary | ICD-10-CM | POA: Diagnosis not present

## 2022-05-15 LAB — CBC
HCT: 26.5 % — ABNORMAL LOW (ref 36.0–46.0)
Hemoglobin: 8.8 g/dL — ABNORMAL LOW (ref 12.0–15.0)
MCH: 27.6 pg (ref 26.0–34.0)
MCHC: 33.2 g/dL (ref 30.0–36.0)
MCV: 83.1 fL (ref 80.0–100.0)
Platelets: 184 10*3/uL (ref 150–400)
RBC: 3.19 MIL/uL — ABNORMAL LOW (ref 3.87–5.11)
RDW: 22.7 % — ABNORMAL HIGH (ref 11.5–15.5)
WBC: 8.8 10*3/uL (ref 4.0–10.5)
nRBC: 0 % (ref 0.0–0.2)

## 2022-05-15 LAB — BASIC METABOLIC PANEL
Anion gap: 8 (ref 5–15)
BUN: 10 mg/dL (ref 6–20)
CO2: 23 mmol/L (ref 22–32)
Calcium: 8.3 mg/dL — ABNORMAL LOW (ref 8.9–10.3)
Chloride: 106 mmol/L (ref 98–111)
Creatinine, Ser: 1.45 mg/dL — ABNORMAL HIGH (ref 0.44–1.00)
GFR, Estimated: 52 mL/min — ABNORMAL LOW (ref >60)
Glucose, Bld: 100 mg/dL — ABNORMAL HIGH (ref 70–99)
Potassium: 3.6 mmol/L (ref 3.5–5.1)
Sodium: 137 mmol/L (ref 135–145)

## 2022-05-15 LAB — LACTATE DEHYDROGENASE: LDH: 261 U/L — ABNORMAL HIGH (ref 98–192)

## 2022-05-15 MED ORDER — LEVOFLOXACIN IN D5W 750 MG/150ML IV SOLN
750.0000 mg | INTRAVENOUS | Status: DC
  Administered 2022-05-15 – 2022-05-17 (×3): 750 mg via INTRAVENOUS
  Filled 2022-05-15 (×4): qty 150

## 2022-05-15 MED ORDER — POTASSIUM CHLORIDE CRYS ER 20 MEQ PO TBCR
40.0000 meq | EXTENDED_RELEASE_TABLET | Freq: Once | ORAL | Status: AC
Start: 2022-05-15 — End: 2022-05-15
  Administered 2022-05-15: 40 meq via ORAL
  Filled 2022-05-15: qty 2

## 2022-05-15 MED ORDER — FENTANYL CITRATE PF 50 MCG/ML IJ SOSY
12.5000 ug | PREFILLED_SYRINGE | INTRAMUSCULAR | Status: DC | PRN
  Administered 2022-05-17 (×2): 12.5 ug via INTRAVENOUS
  Filled 2022-05-15 (×2): qty 1

## 2022-05-15 MED ORDER — SENNA 8.6 MG PO TABS
1.0000 | ORAL_TABLET | Freq: Every day | ORAL | Status: DC
  Administered 2022-05-15 – 2022-05-18 (×4): 8.6 mg via ORAL
  Filled 2022-05-15 (×4): qty 1

## 2022-05-15 MED ORDER — ACETAMINOPHEN 500 MG PO TABS
1000.0000 mg | ORAL_TABLET | Freq: Three times a day (TID) | ORAL | Status: DC
  Administered 2022-05-15 – 2022-05-17 (×6): 1000 mg via ORAL
  Filled 2022-05-15 (×6): qty 2

## 2022-05-15 NOTE — Progress Notes (Signed)
Pharmacy Electrolyte Replacement  Recent Labs:  Recent Labs    05/14/22 0603 05/15/22 0733  K 3.4* 3.6  MG 2.0  --   CREATININE 1.61* 1.45*    Low Critical Values (K </= 2.5, Phos </= 1, Mg </= 1) Present: None  MD Contacted: N/A  Plan: Ordered KCl oral x 1, will recheck in the AM   Blane Ohara, PharmD  PGY1 Pharmacy Resident

## 2022-05-15 NOTE — Progress Notes (Addendum)
NAME:  Kathryn Pena, MRN:  [id number], DOB:  08-07-2001, LOS: 4 ADMISSION DATE:  05/10/2022, CONSULTATION DATE:  05/11/2022 REFERRING MD:  Dr. Allena Katz - [facility], CHIEF COMPLAINT:  sepsis   History of Present Illness:  Kathryn Pena is a 21 year old female with a past medical history for pregnancy-induced hypertension, recent group B streptococcus UTI, and is currently 1 month postpartum who presented to the emergency department 7/29 for complaints of acute onset back pain.  Per H&P patient was complaining of left-sided back pain that began 2 days prior to admission.  Back pain was also associated with dysuria and not relieved with over-the-counter medications.  Patient was admitted per hospitalist services for sepsis work-up where she was found to be E. coli bacteremia from likely pyelonephritis.  Work-up in ED included MRI cervical spine with possible left-sided pyelonephritis.   Afternoon of 7/30 PCCM called for worsening septic like picture.  Patient has had progressive hypotension with associated tachycardia and elevation and fever to Tmax 102.1.   Pertinent  Medical History  Pregnancy-induced hypertension, recent group B streptococcus UTI, and is currently 1 month postpartum  Significant Hospital Events: Including procedures, antibiotic start and stop dates in addition to other pertinent events   7/29 patient presented with left-sided back pain was found to have E. coli bacteremia likely in the setting of left-sided pyelonephritis seen on MRI 7/30 worsening septic shock including hypotension, tachycardia, and elevated fever  Interim History / Subjective:  VQ scan negative. Continues to fever with tachycardia.  No BM yesterday. Appetite improving, ate chic-fil-a. Drinking fluids. Able to ambulate around unit yesterday, SOB improving. Back pain with movement. RUQ pain 5/10, LUQ pain 10/10.   Objective   Blood pressure (!) 100/59, pulse (!) 111, temperature (!) 102.6 F (39.2 C),  temperature source Oral, resp. rate 20, weight 105.2 kg, last menstrual period 06/18/2021, SpO2 100 %, currently breastfeeding.        Intake/Output Summary (Last 24 hours) at 05/15/2022 0958 Last data filed at 05/15/2022 2993 Gross per 24 hour  Intake 988.27 ml  Output 2900 ml  Net -1911.73 ml   Filed Weights   05/13/22 0230  Weight: 105.2 kg   Examination: General: Non-toxic appearing, awake and NAD  HEENT: MMM, PERRL Neuro: A & O x3, No focal neuro deficit   CV: Regular Rhythm, Tachycardic, No murmurs PULM: CTAB, No wheezing or crackles  GI: soft, decreased BS, non-distended, CVA TTP Extremities: No BLE edema, well perfused  Skin: no rashes or lesions  Resolved Hospital Problem list   Lactic acidosis  Assessment & Plan:   Sepsis due to E coli bacteremia and pyelonephritis L pyelonephritis Shock has resolved. Continues to have intermittent fevers and persistent tachycardia. Abd [place], CXR, and VQ scan negative. Requiring higher doses of tylenol. Repeat Cx NGTD - CTX being discontinued per ID, suspect this may be contributing to ongoing fevers - start Fluoroquinolone  - Follow cultures - scheduled tylenol 1g Q6 hr for pain and fever - Monitor urine output  Persistent tachycardia - VQ scan negative r/o  PE. Heart failure is possibility but this does not explain her fevers. - ECHO - telemetry   Acute kidney injury ATN due to sepsis AKI likely prerenal with subsequent ATN due to sepsis. Sepsis has resolved and UOP, Cr, and GFR improving.   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 Mild troponin elevation in the setting of sepsis. BNP elevation 2/2 acute cardiomyopathy.  - repeat ECHO, if no improvement in EF may require GDMT for full return of cardiac function. - telemtry monitoring   Chronic microcytic anemia Down-trending, may be  related to infection, menstrual losses and post-partum. - check LDH and Haptoglobin - daily CBC - Transfuse <7 per protocol - could check iron studies as outpatient  Abdominal and flank pain RUQ, LUQ, left flank. Pyelonephritis and hepatosplenomegaly. Morphine less desirable with impaired renal function.  Patient did not tolerate dilaudid.  - scheduled tylenol, oxycodone - dc morphine - start fentanyl  Constipation Immobility, pain regimen, and poor PO intake likely cause. Decreased bowel sounds on exam. - adding scheduled Senakot to bowel regimen  Hypokalemia - replete - monitor daily  Hypocalcemia Stable - monitor - replete as necessary  Hypomagnesemia Stable.  - Trend - supplement as necessary  1 month postpartum and breast-feeding - pump and dump until safe to use breastmilk from Science writer perspective - prenatal vitamins - maintain hydration  RUQ abd pain No evidence of cholecystitis on RUQ and negative murphys sign. Hepatomegaly seen on CT.  - continue to monitor.   R Hydronephrosis R hydronephrosis likely related to pregnancy, and not pathologic.   Best Practice (right click and "Reselect all SmartList Selections" daily)   Diet/type: Regular consistency (see orders) DVT prophylaxis: other Lovenox GI prophylaxis: Zofran, protonix, miralax Lines: N/A Foley:  N/A Code Status:  full code Last date of multidisciplinary goals of care discussion [Patient updated bedside]  Labs   CBC: Recent Labs  Lab 05/10/22 2037 05/11/22 0610 05/12/22 0613 05/13/22 0103 05/14/22 0603 05/15/22 0733  WBC 17.8* 15.7* 19.0* 13.1* 8.9 8.8  NEUTROABS 16.4* 14.7*  --   --   --   --   HGB 11.3* 10.8* 10.0* 10.3* 9.3* 8.8*  HCT 35.6* 33.2* 30.3* 31.3* 28.3* 26.5*  MCV 85.8 84.5 83.2 83.0 82.5 83.1  PLT 232 206 165 174 168 [id number]    Basic Metabolic Panel: Recent Labs  Lab 05/11/22 0610 05/11/22 1500 05/12/22 0613 05/13/22 0103 05/14/22 0603 05/15/22 0733   NA 136 136 139 136 136 137  K 3.9 3.7 4.0 3.5 3.4* 3.6  CL 105 104 108 105 106 106  CO2 23 21* 22 23 23 23   GLUCOSE 137* 112* 110* 100* 95 100*  BUN 15 19 24* 17 11 10   CREATININE 1.90* 2.18* 2.44* 1.94* 1.61* 1.45*  CALCIUM 8.2* 8.0* 8.0* 8.3* 8.2* 8.3*  MG 1.2* 1.8 1.7 2.1 2.0  --   PHOS 2.5  --   --   --   --   --    GFR: Estimated Creatinine Clearance: 77.2 mL/min (A) (by C-G formula based on SCr of 1.45 mg/dL (H)). Recent Labs  Lab 05/10/22 2037 05/11/22 0610 05/11/22 1500 05/12/22 0613 05/12/22 1142 05/13/22 0103 05/14/22 0603 05/15/22 0733  WBC 17.8*   < >  --  19.0*  --  13.1* 8.9 8.8  LATICACIDVEN 1.2  --  2.7*  --  2.1*  --   --   --    < > = values in this interval not displayed.    Liver Function Tests: Recent Labs  Lab 05/10/22 2037 05/11/22 0610  AST 18 19  ALT 17 17  ALKPHOS 68 62  BILITOT 1.2 0.6  PROT 6.5 6.0*  ALBUMIN 3.3* 2.8*   Recent Labs  Lab 05/11/22 1500 05/12/22 1142  LIPASE 20 17   No  results for input(s): "AMMONIA" in the last 168 hours.  ABG No results found for: "PHART", "PCO2ART", "PO2ART", "HCO3", "TCO2", "ACIDBASEDEF", "O2SAT"   Coagulation Profile: Recent Labs  Lab 05/10/22 2037  INR 1.1    Cardiac Enzymes: No results for input(s): "CKTOTAL", "CKMB", "CKMBINDEX", "TROPONINI" in the last 168 hours.  HbA1C: No results found for: "HGBA1C"  CBG: Recent Labs  Lab 05/11/22 1621  GLUCAP 100*    Review of Systems:     Past Medical History:  She,  has a past medical history of Pregnancy induced hypertension.   Surgical History:   Past Surgical History:  Procedure Laterality Date   NO PAST SURGERIES       Social History:   reports that she quit smoking about 3 years ago. Her smoking use included cigars. She has never used smokeless tobacco. She reports that she does not drink alcohol and does not use drugs.   Family History:  Her family history includes Healthy in her mother; Hypertension in her maternal  aunt. There is no history of Diabetes, Heart disease, or Stroke.   Allergies Allergies  Allergen Reactions   Dilaudid [Hydromorphone Hcl] Nausea Only, Anxiety and Other (See Comments)    Nightmares, feeling of heart 'racing'     Home Medications  Prior to Admission medications   Medication Sig Start Date End Date Taking? Authorizing Provider  acetaminophen (TYLENOL) 500 MG tablet Take 500 mg by mouth every 6 (six) hours as needed for moderate pain.   Yes [provider]  ibuprofen (ADVIL) 200 MG tablet Take 200 mg by mouth every 6 (six) hours as needed for moderate pain.   Yes [provider]  NIFEdipine (ADALAT CC) 30 MG 24 hr tablet Take 1 tablet (30 mg total) by mouth daily. 04/16/22  Yes Levie Heritage, DO  acetaminophen (TYLENOL) 500 MG tablet Take 2 tablets (1,000 mg total) by mouth every 6 (six) hours as needed for moderate pain. Patient not taking: Reported on 05/10/2022 09/10/21   Donette Larry, CNM  furosemide (LASIX) 20 MG tablet Take 1 tablet (20 mg total) by mouth daily for 4 days. 04/14/22 04/18/22  Carlynn Herald, CNM  ibuprofen (ADVIL) 600 MG tablet Take 1 tablet (600 mg total) by mouth every 6 (six) hours as needed. Patient not taking: Reported on 05/10/2022 04/14/22   Carlynn Herald, CNM  ibuprofen (ADVIL) 600 MG tablet Take 1 tablet (600 mg total) by mouth every 6 (six) hours. Patient not taking: Reported on 05/10/2022 04/15/22   Levie Heritage, DO  Prenatal Vit-Fe Phos-FA-Omega (VITAFOL GUMMIES) 3.33-0.333-34.8 MG CHEW Chew 1 tablet by mouth daily. Patient not taking: Reported on 05/10/2022 09/02/21   Constant, Peggy, MD  enalapril (VASOTEC) 5 MG tablet Take 1 tablet (5 mg total) by mouth daily. 02/24/19 12/21/19  Hermina Staggers, MD     Critical care time: 17

## 2022-05-15 NOTE — Progress Notes (Signed)
Spoke with patient about pumping and dumping breast milk due to increased dose of enoxaparin (>40 mg per day). Patient has not been breastfeeding or storing breast milk since admission, no concerns noted. Patient should continue to pump and dump while on the increased dose of enoxaparin and flouroquinolone antibiotic.

## 2022-05-15 NOTE — Consult Note (Signed)
[facility]  Total days of antibiotics 5         Reason for Consult: fever in setting of ecoli sepsis 2/2 pyelonephritis and secondary bacteremia  Referring Physician: Chestine Spore  Principal Problem:   Pyelonephritis    HPI: Kathryn Pena is a 21 y.o. female with history of gestational hypertension, gave birth 1 month ago, is BF. Admitted on 7/29 Admitted with leukocytosis, fever, and AKI.  Imaging of CT abd and mri T/L spine show signs of left pyelonephritis. Due to ongoing fevers VQ scan negative, renal U/S now commenting on right hydronephrosis but left kidney is WNL.   Due to ongoing high fevers, further testing was done to look for satelite infection or other causes of fever:  Leukocytosis improved however still ongoing fever of 102-103F.  U/S lower extremities ruled out DVT U/s abdomen ruled out of renal abscess VQ scan negative for PE CXR - no pneumonia MRI of spine = does not show any epidural abscess   Past Medical History:  Diagnosis Date   Pregnancy induced hypertension     Allergies:  Allergies  Allergen Reactions   Dilaudid [Hydromorphone Hcl] Nausea Only, Anxiety and Other (See Comments)    Nightmares, feeling of heart 'racing'    Current antibiotics:   MEDICATIONS:  Breast Milk   Feeding See admin instructions   Chlorhexidine Gluconate Cloth  6 each Topical Daily   enoxaparin (LOVENOX) injection  50 mg Subcutaneous Q24H   prenatal vitamin w/FE, FA  1 tablet Oral Q1200    Social History   Tobacco Use   Smoking status: Former    Types: Cigars    Quit date: 06/27/2018    Years since quitting: 3.8   Smokeless tobacco: Never  Vaping Use   Vaping Use: Never used  Substance Use Topics   Alcohol use: No   Drug use: No    Family History  Problem Relation Age of Onset   Healthy Mother    Hypertension Maternal Aunt    Diabetes Neg Hx    Heart disease Neg Hx    Stroke Neg Hx     Review of Systems -     OBJECTIVE: Temp:  [98.4 F (36.9 C)-102.8 F (39.3 C)] 102.6 F (39.2 C) (08/03 0744) Pulse Rate:  [104-132] 111 (08/03 0900) Resp:  [15-34] 20 (08/03 0900) BP: (100-126)/(58-102) 100/59 (08/03 0900) SpO2:  [93 %-100 %] 100 % (08/03 0900)   LABS: Results for orders placed or performed during the hospital encounter of 05/10/22 (from the past 48 hour(s))  Basic metabolic panel     Status: Abnormal   Collection Time: 05/14/22  6:03 AM  Result Value Ref Range   Sodium 136 135 - 145 mmol/L   Potassium 3.4 (L) 3.5 - 5.1 mmol/L   Chloride 106 98 - 111 mmol/L   CO2 23 22 - 32 mmol/L   Glucose, Bld 95 70 - 99 mg/dL    Comment: Glucose reference range applies only to samples taken after fasting for at least 8 hours.   BUN 11 6 - 20 mg/dL   Creatinine, Ser 5.46 (H) 0.44 - 1.00 mg/dL   Calcium 8.2 (L) 8.9 - 10.3 mg/dL   GFR, Estimated 46 (L) >60 mL/min    Comment: (NOTE) Calculated using the CKD-EPI Creatinine Equation (2021)    Anion gap 7 5 - 15    Comment: Performed at [facility], [address] [place], [place] [address]  CBC  Status: Abnormal   Collection Time: 05/14/22  6:03 AM  Result Value Ref Range   WBC 8.9 4.0 - 10.5 K/uL   RBC 3.43 (L) 3.87 - 5.11 MIL/uL   Hemoglobin 9.3 (L) 12.0 - 15.0 g/dL   HCT 41.6 (L) 60.6 - 30.1 %   MCV 82.5 80.0 - 100.0 fL   MCH 27.1 26.0 - 34.0 pg   MCHC 32.9 30.0 - 36.0 g/dL   RDW 60.1 (H) 09.3 - 23.5 %   Platelets 168 150 - 400 K/uL   nRBC 0.0 0.0 - 0.2 %    Comment: Performed at [facility], [address], [place], [place] [address]  Magnesium     Status: None   Collection Time: 05/14/22  6:03 AM  Result Value Ref Range   Magnesium 2.0 1.7 - 2.4 mg/dL    Comment: Performed at [facility], [address] [place], [place] [address]  CBC     Status: Abnormal   Collection Time: 05/15/22  7:33 AM  Result Value Ref Range   WBC 8.8 4.0 - 10.5 K/uL   RBC 3.19 (L) 3.87 - 5.11 MIL/uL   Hemoglobin 8.8 (L) 12.0 -  15.0 g/dL   HCT 70.6 (L) 23.7 - 62.8 %   MCV 83.1 80.0 - 100.0 fL   MCH 27.6 26.0 - 34.0 pg   MCHC 33.2 30.0 - 36.0 g/dL   RDW 31.5 (H) 17.6 - 16.0 %   Platelets 184 150 - 400 K/uL   nRBC 0.0 0.0 - 0.2 %    Comment: Performed at [facility], [address] [place], [place] [address]  Basic metabolic panel     Status: Abnormal   Collection Time: 05/15/22  7:33 AM  Result Value Ref Range   Sodium 137 135 - 145 mmol/L   Potassium 3.6 3.5 - 5.1 mmol/L   Chloride 106 98 - 111 mmol/L   CO2 23 22 - 32 mmol/L   Glucose, Bld 100 (H) 70 - 99 mg/dL    Comment: Glucose reference range applies only to samples taken after fasting for at least 8 hours.   BUN 10 6 - 20 mg/dL   Creatinine, Ser 6.26 (H) 0.44 - 1.00 mg/dL   Calcium 8.3 (L) 8.9 - 10.3 mg/dL   GFR, Estimated 52 (L) >60 mL/min    Comment: (NOTE) Calculated using the CKD-EPI Creatinine Equation (2021)    Anion gap 8 5 - 15    Comment: Performed at [facility], [address] [place], [place] [address]    MICRO: 7/29 blood cx ecoli 7/29 urine cx ecoli  7/31 blood cx NGTD IMAGING: NM Pulmonary Perfusion  Result Date: 05/14/2022 CLINICAL DATA:  Pulmonary embolus suspected EXAM: NUCLEAR MEDICINE PERFUSION LUNG SCAN TECHNIQUE: Perfusion images were obtained in multiple projections after intravenous injection of radiopharmaceutical. Ventilation scans intentionally deferred if perfusion scan and chest x-ray adequate for interpretation during COVID 19 epidemic. RADIOPHARMACEUTICALS:  4.2 mCi Tc-85m MAA IV COMPARISON:  None Available. FINDINGS: No segmental perfusion defects. IMPRESSION: Pulmonary embolus absent (normal). Electronically Signed   By: Allegra Lai M.D.   On: 05/14/2022 13:12   DG CHEST PORT 1 VIEW  Result Date: 05/14/2022 CLINICAL DATA:  Tachycardia, back pain. EXAM: PORTABLE CHEST 1 VIEW COMPARISON:  May 10, 2022. FINDINGS: The heart size and mediastinal contours are within normal limits. Both lungs are  clear. The visualized skeletal structures are unremarkable. IMPRESSION: No active disease. Electronically Signed  By: Lupita Raider M.D.   On: 05/14/2022 11:21   US Abdomen Complete  Result Date: 05/13/2022 CLINICAL DATA:  Right upper quadrant pain and pyelonephritis EXAM: ABDOMEN ULTRASOUND COMPLETE COMPARISON:  CT abdomen pelvis 05/11/2022 FINDINGS: Gallbladder: No gallstones or wall thickening visualized. No sonographic Murphy sign noted by sonographer. Common bile duct: Diameter: 4 mm. No intrahepatic biliary ductal dilatation. Liver: No focal lesion identified. The liver is somewhat heterogeneous but within normal limits in parenchymal echogenicity. Portal vein is patent on color Doppler imaging with normal direction of blood flow towards the liver. IVC: No abnormality visualized. Pancreas: Visualized portion unremarkable. Spleen: Mildly enlarged, similar to CT. Right Kidney: Length: 14.3 cm. Echogenicity within normal limits. Mild hydronephrosis. No mass identified. Left Kidney: Length: 14.3. Echogenicity within normal limits. No mass or hydronephrosis visualized. Abdominal aorta: No aneurysm visualized although evaluation of the distal aorta and bifurcation is precluded by overlying bowel gas. Other findings: None. IMPRESSION: 1. Mild right hydronephrosis. 2. Mild splenomegaly. 3. No other acute finding in the abdomen. Electronically Signed   By: Wiliam Ke M.D.   On: 05/13/2022 17:23   VAS [place] LOWER EXTREMITY VENOUS (DVT)  Result Date: 05/13/2022  Lower Venous DVT Study Patient Name:  OPALINE REYBURN  Date of Exam:   05/13/2022 Medical Rec #: [id number]           Accession #:    [id number] Date of Birth: 02/15/2001           Patient Gender: F Patient Age:   21 years Exam Location:  [facility] Procedure:      VAS [place] LOWER EXTREMITY VENOUS (DVT) Referring Phys: PRANAV PATEL --------------------------------------------------------------------------------  Indications: Edema.  Risk Factors:  Post partum. Limitations: Size and depth of vessels. Comparison Study: No prior studies. Performing Technologist: Jean Rosenthal RDMS, RVT  Examination Guidelines: A complete evaluation includes B-mode imaging, spectral Doppler, color Doppler, and power Doppler as needed of all accessible portions of each vessel. Bilateral testing is considered an integral part of a complete examination. Limited examinations for reoccurring indications may be performed as noted. The reflux portion of the exam is performed with the patient in reverse Trendelenburg.  +---------+---------------+---------+-----------+----------+--------------+ RIGHT    CompressibilityPhasicitySpontaneityPropertiesThrombus Aging +---------+---------------+---------+-----------+----------+--------------+ CFV      Full           Yes      Yes                                 +---------+---------------+---------+-----------+----------+--------------+ SFJ      Full                                                        +---------+---------------+---------+-----------+----------+--------------+ FV Prox  Full                                                        +---------+---------------+---------+-----------+----------+--------------+ FV Mid   Full                                                        +---------+---------------+---------+-----------+----------+--------------+  FV DistalFull                                                        +---------+---------------+---------+-----------+----------+--------------+ PFV      Full                                                        +---------+---------------+---------+-----------+----------+--------------+ POP      Full           Yes      Yes                                 +---------+---------------+---------+-----------+----------+--------------+ PTV      Full                                                         +---------+---------------+---------+-----------+----------+--------------+ PERO     Full                                                        +---------+---------------+---------+-----------+----------+--------------+   +---------+---------------+---------+-----------+----------+--------------+ LEFT     CompressibilityPhasicitySpontaneityPropertiesThrombus Aging +---------+---------------+---------+-----------+----------+--------------+ CFV      Full           Yes      Yes                                 +---------+---------------+---------+-----------+----------+--------------+ SFJ      Full                                                        +---------+---------------+---------+-----------+----------+--------------+ FV Prox  Full                                                        +---------+---------------+---------+-----------+----------+--------------+ FV Mid   Full                                                        +---------+---------------+---------+-----------+----------+--------------+ FV DistalFull                                                        +---------+---------------+---------+-----------+----------+--------------+  PFV      Full                                                        +---------+---------------+---------+-----------+----------+--------------+ POP      Full           Yes      Yes                                 +---------+---------------+---------+-----------+----------+--------------+ PTV      Full                                                        +---------+---------------+---------+-----------+----------+--------------+ PERO     Full                                                        +---------+---------------+---------+-----------+----------+--------------+     Summary: RIGHT: - There is no evidence of deep vein thrombosis in the lower extremity.  - No cystic structure found in  the popliteal fossa.  LEFT: - There is no evidence of deep vein thrombosis in the lower extremity.  - No cystic structure found in the popliteal fossa.  *See table(s) above for measurements and observations. Electronically signed by Waverly Ferrari MD on 05/13/2022 at 1:34:49 PM.    Final     Thoracic MRI: IMPRESSION: 1. No MRI evidence for acute infection within the cervical, thoracic, or lumbar spine. 2. Asymmetric enlargement with somewhat striated appearance of the left kidney with associated perinephric fat stranding. Finding raises the possibility for acute pyelonephritis. Correlation with laboratory values and urinalysis recommended. 3. Mild noncompressive disc bulging at C4-5 and C5-6 without stenosis. 4. Mild chronic compression deformities at the superior endplates of T3 and T4. No retropulsion or stenosis.   HISTORICAL MICRO/IMAGING  Assessment/Plan:  21yo F with Ecoli bacteremia/pyelonephritis - presented with severe sepsis  - will change abtx to FQ due to concern for beta lactam causing fever - will need to pump/dump breast milk in the meantime - follow fever curve since we are switching to a different abtx - if not improved in 24hr, recommend repeat abd/pelvis CT to see if renal abscess is developing that may not have been seen on U/S. - will add differential  No other imaging recommended at this time

## 2022-05-16 ENCOUNTER — Inpatient Hospital Stay (HOSPITAL_COMMUNITY): Payer: Medicaid Other

## 2022-05-16 ENCOUNTER — Encounter (HOSPITAL_COMMUNITY): Payer: Self-pay | Admitting: Internal Medicine

## 2022-05-16 DIAGNOSIS — R6520 Severe sepsis without septic shock: Secondary | ICD-10-CM | POA: Diagnosis not present

## 2022-05-16 DIAGNOSIS — R008 Other abnormalities of heart beat: Secondary | ICD-10-CM | POA: Diagnosis not present

## 2022-05-16 DIAGNOSIS — N12 Tubulo-interstitial nephritis, not specified as acute or chronic: Secondary | ICD-10-CM | POA: Diagnosis not present

## 2022-05-16 DIAGNOSIS — N179 Acute kidney failure, unspecified: Secondary | ICD-10-CM | POA: Diagnosis not present

## 2022-05-16 DIAGNOSIS — R7881 Bacteremia: Secondary | ICD-10-CM | POA: Diagnosis not present

## 2022-05-16 DIAGNOSIS — I5021 Acute systolic (congestive) heart failure: Secondary | ICD-10-CM | POA: Diagnosis not present

## 2022-05-16 DIAGNOSIS — B9629 Other Escherichia coli [E. coli] as the cause of diseases classified elsewhere: Secondary | ICD-10-CM | POA: Diagnosis not present

## 2022-05-16 LAB — CBC
HCT: 24.9 % — ABNORMAL LOW (ref 36.0–46.0)
Hemoglobin: 8.3 g/dL — ABNORMAL LOW (ref 12.0–15.0)
MCH: 27.4 pg (ref 26.0–34.0)
MCHC: 33.3 g/dL (ref 30.0–36.0)
MCV: 82.2 fL (ref 80.0–100.0)
Platelets: 219 K/uL (ref 150–400)
RBC: 3.03 MIL/uL — ABNORMAL LOW (ref 3.87–5.11)
RDW: 22.8 % — ABNORMAL HIGH (ref 11.5–15.5)
WBC: 10.4 K/uL (ref 4.0–10.5)
nRBC: 0 % (ref 0.0–0.2)

## 2022-05-16 LAB — IRON AND TIBC
Iron: 8 ug/dL — ABNORMAL LOW (ref 28–170)
Saturation Ratios: 4 % — ABNORMAL LOW (ref 10.4–31.8)
TIBC: 211 ug/dL — ABNORMAL LOW (ref 250–450)
UIBC: 203 ug/dL

## 2022-05-16 LAB — BASIC METABOLIC PANEL
Anion gap: 10 (ref 5–15)
BUN: 9 mg/dL (ref 6–20)
CO2: 20 mmol/L — ABNORMAL LOW (ref 22–32)
Calcium: 8.3 mg/dL — ABNORMAL LOW (ref 8.9–10.3)
Chloride: 106 mmol/L (ref 98–111)
Creatinine, Ser: 1.18 mg/dL — ABNORMAL HIGH (ref 0.44–1.00)
GFR, Estimated: >60 mL/min (ref >60)
Glucose, Bld: 128 mg/dL — ABNORMAL HIGH (ref 70–99)
Potassium: 3.6 mmol/L (ref 3.5–5.1)
Sodium: 136 mmol/L (ref 135–145)

## 2022-05-16 LAB — ECHOCARDIOGRAM LIMITED
Area-P 1/2: 5.62 cm2
S' Lateral: 3.7 cm
Weight: 3710.78 oz

## 2022-05-16 LAB — LACTATE DEHYDROGENASE: LDH: 242 U/L — ABNORMAL HIGH (ref 98–192)

## 2022-05-16 LAB — PROCALCITONIN: Procalcitonin: 4.7 ng/mL

## 2022-05-16 LAB — HAPTOGLOBIN: Haptoglobin: 578 mg/dL — ABNORMAL HIGH (ref 33–278)

## 2022-05-16 MED ORDER — METOPROLOL TARTRATE 12.5 MG HALF TABLET
12.5000 mg | ORAL_TABLET | Freq: Two times a day (BID) | ORAL | Status: DC
  Administered 2022-05-16 – 2022-05-18 (×5): 12.5 mg via ORAL
  Filled 2022-05-16 (×5): qty 1

## 2022-05-16 MED ORDER — POTASSIUM CHLORIDE CRYS ER 20 MEQ PO TBCR
40.0000 meq | EXTENDED_RELEASE_TABLET | Freq: Once | ORAL | Status: AC
  Administered 2022-05-16: 40 meq via ORAL
  Filled 2022-05-16: qty 2

## 2022-05-16 MED ORDER — BISACODYL 5 MG PO TBEC: 5.0000 mg | DELAYED_RELEASE_TABLET | Freq: Every day | ORAL | Status: DC | PRN

## 2022-05-16 MED ORDER — BISACODYL 5 MG PO TBEC
5.0000 mg | DELAYED_RELEASE_TABLET | Freq: Once | ORAL | Status: AC
Start: 2022-05-16 — End: 2022-05-16
  Administered 2022-05-16: 5 mg via ORAL
  Filled 2022-05-16: qty 1

## 2022-05-16 NOTE — Progress Notes (Signed)
[facility] ADULT ICU REPLACEMENT PROTOCOL   The patient does apply for the [facility] Adult ICU Electrolyte Replacment Protocol based on the criteria listed below:   1.Exclusion criteria: TCTS patients, ECMO patients, and Dialysis patients 2. Is GFR >/= 30 ml/min? Yes.    Patient's GFR today is >60 3. Is SCr </= 2? Yes Patient's SCr is 1.18 mg/dL 4. Did SCr increase >/= 0.5 in 24 hours? No. 5.Pt's weight >40kg  Yes.   6. Abnormal electrolyte(s): K+ 3.6  7. Electrolytes replaced per protocol 8.  Call MD STAT for K+ </= 2.5, Phos </= 1, or Mag </= 1 Physician:  Thresa Ross 05/16/2022 2:24 AM

## 2022-05-16 NOTE — Consult Note (Addendum)
Cardiology Consultation:   Patient ID: Kathryn Pena MRN: [id number]; DOB: [birth year]  Admit date: 05/10/2022 Date of Consult: 05/16/2022  PCP:  Patient, No Pcp Per   [facility] Providers Cardiologist:  None     Patient Profile:   Kathryn Pena is a 21 y.o. female with a hx of gestational HTN, recent group B streptococcal UTI, obesity, postpartum approx 1 month ago who is being seen 05/16/2022 for the evaluation of systolic heart failure at the request of Dr. Elsworth Soho.  History of Present Illness:   Ms. Kathryn Pena is a 21 year old female with above medical history. Per chart review, patient does not have any cardiac history and does not follow with cardiology.   Patient presented to the ED on 7/29. She was 1 month post partum, complaining of left-sided back pain, dysuria, and pelvic pain prior to urination. Patient was febrile in the ED with a temp of 101.52F. Due to patient's complaints of back pain near her epidural site, ED ordered an MRI of the spine that was negative for acute spinal processes, but did showed left perinephritic fat stranding consistent with left pyelonephritis. UA was positive for pyuria. Patient was febrile with temp 101.52F, tachycardic with HR 125 BPM, tachypneic with RR 32 breaths per minute, and had a WBC of 17.5 on presentation. Blood cultures were obtained, grew E.Coli. Urine cultures also positive for E.Coli.   Patient was given IV fluids and started on broad spectrum IV antibiotics, however was noted to have progressive hypotension, worsening fever, and tachycardia on 7/30 and patient was was transferred to the ICU. She went on to develop AKI, suspected to be prerenal in the setting of ongoing sepsis. Creatinine peaked at 2.44, now improving with IV hydration.  Echocardiogram was initially completed on 7/30 while patient was acutely ill. Showed EF 30-35% with global hypokinesis, indeterminate diastolic filling, normal RV systolic function.  Question her  reduction in EF was due to sepsis or post-partum cardiomyopathy. Repeat Echo is pending.   Now, patient is no longer in shock. Continues to have intermittent fevers and tachycardia. Abd [place], CXR, Vq2 scan negative. ID consulted, changed her antibiotics to FQ due to concern for beta lactam causing fever.   Patient did have a Science writer, patient is breastfeeding. For now, patient is pumping and dumping until safe to use breastmilk from lactation consultant perspective.   Labs today showed Na 136, K 3.6, creatinine 1.18, procalcitonin 4.70, WBC 10.4, hemoglobin 8.3, platelets 219. hsTn 10>>82 on 7/31. BNP elevated to 451.6 on 7/31.   On interview, patient denied having any past cardiac history. She has a history of preeclampsia with a previous pregnancy, but she only had gestational hypertension with her most recent pregnancy. Was treated with nifedipine. She delivered her daughter approx 1 month ago. Denies any known complications during her delivery. After her delivery, she denied having any chest pain, palpitations, sob, dizziness, ankle edema, headaches, syncope/presyncope. She believed that she was recovering well from her delivery and was in her usual state of health.   Around 7/28, patient noticed some left-sided back pain and painful urination which prompted her to come to the ED. While admitted to the hospital, she had worsening sepsis and was admitted to the ICU. While in the ICU, patient did start to feel some chest tightness that was located in the middle of her chest. Tightness was only present when she would take a deep breath, cough, or laugh. She did have some SOB as well, but denies  orthopnea. Denies any ankle edema throughout her stay in ICU. Did have some palpitations, denied dizziness.   Today, after being given IV fluids and ABX, patient is feeling much better. She denies any sob, palpitations, ankle edema, orthopnea. Continues to have some mild chest tightness on deep  inhalation.   Patient was breastfeeding prior to her admission. Currently pumping and dumping. Does not think that she will resume breastfeeding at discharge. Admits to infrequent cigarette use, denies family history of cardiac problems or history of DM.    Past Medical History:  Diagnosis Date   Pregnancy induced hypertension     Past Surgical History:  Procedure Laterality Date   NO PAST SURGERIES       Home Medications:  Prior to Admission medications   Medication Sig Start Date End Date Taking? Authorizing Provider  acetaminophen (TYLENOL) 500 MG tablet Take 500 mg by mouth every 6 (six) hours as needed for moderate pain.   Yes [provider]  ibuprofen (ADVIL) 200 MG tablet Take 200 mg by mouth every 6 (six) hours as needed for moderate pain.   Yes [provider]  NIFEdipine (ADALAT CC) 30 MG 24 hr tablet Take 1 tablet (30 mg total) by mouth daily. 04/16/22  Yes Truett Mainland, DO  acetaminophen (TYLENOL) 500 MG tablet Take 2 tablets (1,000 mg total) by mouth every 6 (six) hours as needed for moderate pain. Patient not taking: Reported on 05/10/2022 09/10/21   Julianne Handler, CNM  furosemide (LASIX) 20 MG tablet Take 1 tablet (20 mg total) by mouth daily for 4 days. 04/14/22 04/18/22  Deloris Ping, CNM  ibuprofen (ADVIL) 600 MG tablet Take 1 tablet (600 mg total) by mouth every 6 (six) hours as needed. Patient not taking: Reported on 05/10/2022 04/14/22   Deloris Ping, CNM  ibuprofen (ADVIL) 600 MG tablet Take 1 tablet (600 mg total) by mouth every 6 (six) hours. Patient not taking: Reported on 05/10/2022 04/15/22   Truett Mainland, DO  Prenatal Vit-Fe Phos-FA-Omega (VITAFOL GUMMIES) 3.33-0.333-34.8 MG CHEW Chew 1 tablet by mouth daily. Patient not taking: Reported on 05/10/2022 09/02/21   Constant, Peggy, MD  enalapril (VASOTEC) 5 MG tablet Take 1 tablet (5 mg total) by mouth daily. 02/24/19 12/21/19  Chancy Milroy, MD    Inpatient  Medications: Scheduled Meds:  acetaminophen  1,000 mg Oral TID   Breast Milk   Feeding See admin instructions   Chlorhexidine Gluconate Cloth  6 each Topical Daily   enoxaparin (LOVENOX) injection  50 mg Subcutaneous Q24H   prenatal vitamin w/FE, FA  1 tablet Oral Q1200   senna  1 tablet Oral Daily   Continuous Infusions:  sodium chloride Stopped (05/12/22 1124)   levofloxacin (LEVAQUIN) IV Stopped (05/16/22 0025)   PRN Meds: bisacodyl, fentaNYL (SUBLIMAZE) injection, melatonin, methocarbamol, ondansetron (ZOFRAN) IV, oxyCODONE, polyethylene glycol, prochlorperazine  Allergies:    Allergies  Allergen Reactions   Dilaudid [Hydromorphone Hcl] Nausea Only, Anxiety and Other (See Comments)    Nightmares, feeling of heart 'racing'    Social History:   Social History   Socioeconomic History   Marital status: Single    Spouse name: Not on file   Number of children: Not on file   Years of education: Not on file   Highest education level: Not on file  Occupational History   Not on file  Tobacco Use   Smoking status: Former    Types: Cigars    Quit date: 06/27/2018  Years since quitting: 3.8   Smokeless tobacco: Never  Vaping Use   Vaping Use: Never used  Substance and Sexual Activity   Alcohol use: No   Drug use: No   Sexual activity: Yes  Other Topics Concern   Not on file  Social History Narrative   Not on file   Social Determinants of Health   Financial Resource Strain: Low Risk  (02/03/2019)   Overall Financial Resource Strain (CARDIA)    Difficulty of Paying Living Expenses: Not hard at all  Food Insecurity: No Food Insecurity (04/10/2022)   Hunger Vital Sign    Worried About Running Out of Food in the Last Year: Never true    Ran Out of Food in the Last Year: Never true  Transportation Needs: No Transportation Needs (04/10/2022)   PRAPARE - Hydrologist (Medical): No    Lack of Transportation (Non-Medical): No  Physical Activity:  Not on file  Stress: No Stress Concern Present (02/03/2019)   [place]    Feeling of Stress : Only a little  Social Connections: Not on file  Intimate Partner Violence: Not At Risk (02/03/2019)   Humiliation, Afraid, Rape, and Kick questionnaire    Fear of Current or Ex-Partner: No    Emotionally Abused: No    Physically Abused: No    Sexually Abused: No    Family History:    Family History  Problem Relation Age of Onset   Healthy Mother    Hypertension Maternal Aunt    Diabetes Neg Hx    Heart disease Neg Hx    Stroke Neg Hx      ROS:  Please see the history of present illness.   All other ROS reviewed and negative.     Physical Exam/Data:   Vitals:   05/16/22 1000 05/16/22 1100 05/16/22 1103 05/16/22 1200  BP: 107/69 (!) 94/51  (!) 120/91  Pulse:      Resp: (!) 24 (!) 22  17  Temp:   99.1 F (37.3 C)   TempSrc:   Oral   SpO2:      Weight:        Intake/Output Summary (Last 24 hours) at 05/16/2022 1220 Last data filed at 05/16/2022 0533 Gross per 24 hour  Intake 899.96 ml  Output 2075 ml  Net -1175.04 ml      05/13/2022    2:30 AM 04/21/2022    1:35 PM 04/12/2022    4:47 PM  Last 3 Weights  Weight (lbs) 231 lb 14.8 oz 237 lb 259 lb 6.4 oz  Weight (kg) 105.2 kg 107.502 kg 117.663 kg     Body mass index is 35.26 kg/m.  General:  Well nourished, well developed, in no acute distress. Sitting comfortably on the side of the bed eating lunch  HEENT: normal Neck: no JVD Vascular: Radial pulses 2+ bilaterally Cardiac:  normal S1, S2; RRR; no murmur  Lungs:  clear to auscultation bilaterally, no wheezing, rhonchi or rales  Abd: soft, nontender Ext: no edema Musculoskeletal:  No deformities, BUE and BLE strength normal and equal Skin: warm and dry  Neuro:  CNs 2-12 intact, no focal abnormalities noted Psych:  Normal affect   EKG:  The EKG was personally reviewed and demonstrates:  Sinus  tachycardia, HR 130, small Q waves (<1/3 of QRS complex height) in inferior leads. Present on past EKG from 09/2020  Telemetry:  Telemetry was personally reviewed and demonstrates:  Sinus tachycardia, HR in the 100s  Relevant CV Studies:  Echocardiogram 05/11/22 1. Left ventricular ejection fraction, by estimation, is 30 to 35%. The  left ventricle has moderately decreased function. The left ventricle  demonstrates global hypokinesis. The left ventricular internal cavity size  was mildly dilated. Indeterminate  diastolic filling due to E-A fusion.   2. Right ventricular systolic function is normal. The right ventricular  size is normal.   3. Left atrial size was mildly dilated.   4. The mitral valve is normal in structure. No evidence of mitral valve  regurgitation. No evidence of mitral stenosis.   5. The aortic valve is normal in structure. Aortic valve regurgitation is  not visualized. No aortic stenosis is present.   6. The inferior vena cava is normal in size with greater than 50%  respiratory variability, suggesting right atrial pressure of 3 mmHg.   Laboratory Data:  High Sensitivity Troponin:   Recent Labs  Lab 05/11/22 1500 05/12/22 1142  TROPONINIHS 10 82*     Chemistry Recent Labs  Lab 05/12/22 0613 05/13/22 0103 05/14/22 0603 05/15/22 0733 05/16/22 0029  NA 139 136 136 137 136  K 4.0 3.5 3.4* 3.6 3.6  CL 108 105 106 106 106  CO2 22 23 23 23  20*  GLUCOSE 110* 100* 95 100* 128*  BUN 24* 17 11 10 9   CREATININE 2.44* 1.94* 1.61* 1.45* 1.18*  CALCIUM 8.0* 8.3* 8.2* 8.3* 8.3*  MG 1.7 2.1 2.0  --   --   GFRNONAA 28* 37* 46* 52* >60  ANIONGAP 9 8 7 8 10     Recent Labs  Lab 05/10/22 2037 05/11/22 0610  PROT 6.5 6.0*  ALBUMIN 3.3* 2.8*  AST 18 19  ALT 17 17  ALKPHOS 68 62  BILITOT 1.2 0.6   Lipids No results for input(s): "CHOL", "TRIG", "HDL", "LABVLDL", "LDLCALC", "CHOLHDL" in the last 168 hours.  Hematology Recent Labs  Lab 05/14/22 0603  05/15/22 0733 05/16/22 0029  WBC 8.9 8.8 10.4  RBC 3.43* 3.19* 3.03*  HGB 9.3* 8.8* 8.3*  HCT 28.3* 26.5* 24.9*  MCV 82.5 83.1 82.2  MCH 27.1 27.6 27.4  MCHC 32.9 33.2 33.3  RDW 22.8* 22.7* 22.8*  PLT 168 184 219   Thyroid  Recent Labs  Lab 05/11/22 0610  TSH 2.420    BNP Recent Labs  Lab 05/12/22 0613  BNP 451.6*    DDimer  Recent Labs  Lab 05/11/22 1500 05/12/22 0613  DDIMER 3.44* 6.77*     Radiology/Studies:  NM Pulmonary Perfusion  Result Date: 05/14/2022 CLINICAL DATA:  Pulmonary embolus suspected EXAM: NUCLEAR MEDICINE PERFUSION LUNG SCAN TECHNIQUE: Perfusion images were obtained in multiple projections after intravenous injection of radiopharmaceutical. Ventilation scans intentionally deferred if perfusion scan and chest x-ray adequate for interpretation during COVID 19 epidemic. RADIOPHARMACEUTICALS:  4.2 mCi Tc-30m MAA IV COMPARISON:  None Available. FINDINGS: No segmental perfusion defects. IMPRESSION: Pulmonary embolus absent (normal). Electronically Signed   By: 05/14/22 M.D.   On: 05/14/2022 13:12   DG CHEST PORT 1 VIEW  Result Date: 05/14/2022 CLINICAL DATA:  Tachycardia, back pain. EXAM: PORTABLE CHEST 1 VIEW COMPARISON:  May 10, 2022. FINDINGS: The heart size and mediastinal contours are within normal limits. Both lungs are clear. The visualized skeletal structures are unremarkable. IMPRESSION: No active disease. Electronically Signed   By: 07/14/2022 M.D.   On: 05/14/2022 11:21   May 12, 2022 Abdomen Complete  Result Date: 05/13/2022 CLINICAL DATA:  Right upper quadrant pain  and pyelonephritis EXAM: ABDOMEN ULTRASOUND COMPLETE COMPARISON:  CT abdomen pelvis 05/11/2022 FINDINGS: Gallbladder: No gallstones or wall thickening visualized. No sonographic Murphy sign noted by sonographer. Common bile duct: Diameter: 4 mm. No intrahepatic biliary ductal dilatation. Liver: No focal lesion identified. The liver is somewhat heterogeneous but within normal limits  in parenchymal echogenicity. Portal vein is patent on color Doppler imaging with normal direction of blood flow towards the liver. IVC: No abnormality visualized. Pancreas: Visualized portion unremarkable. Spleen: Mildly enlarged, similar to CT. Right Kidney: Length: 14.3 cm. Echogenicity within normal limits. Mild hydronephrosis. No mass identified. Left Kidney: Length: 14.3. Echogenicity within normal limits. No mass or hydronephrosis visualized. Abdominal aorta: No aneurysm visualized although evaluation of the distal aorta and bifurcation is precluded by overlying bowel gas. Other findings: None. IMPRESSION: 1. Mild right hydronephrosis. 2. Mild splenomegaly. 3. No other acute finding in the abdomen. Electronically Signed   By: Merilyn Baba M.D.   On: 05/13/2022 17:23   VAS [place] LOWER EXTREMITY VENOUS (DVT)  Result Date: 05/13/2022  Lower Venous DVT Study Patient Name:  ZARAIAH BURNARD  Date of Exam:   05/13/2022 Medical Rec #: [id number]           Accession #:    [id number] Date of Birth: Nov 17, 2000           Patient Gender: F Patient Age:   83 years Exam Location:  [facility] Procedure:      VAS [place] LOWER EXTREMITY VENOUS (DVT) Referring Phys: PRANAV PATEL --------------------------------------------------------------------------------  Indications: Edema.  Risk Factors: Post partum. Limitations: Size and depth of vessels. Comparison Study: No prior studies. Performing Technologist: Darlin Coco RDMS, RVT  Examination Guidelines: A complete evaluation includes B-mode imaging, spectral Doppler, color Doppler, and power Doppler as needed of all accessible portions of each vessel. Bilateral testing is considered an integral part of a complete examination. Limited examinations for reoccurring indications may be performed as noted. The reflux portion of the exam is performed with the patient in reverse Trendelenburg.  +---------+---------------+---------+-----------+----------+--------------+ RIGHT     CompressibilityPhasicitySpontaneityPropertiesThrombus Aging +---------+---------------+---------+-----------+----------+--------------+ CFV      Full           Yes      Yes                                 +---------+---------------+---------+-----------+----------+--------------+ SFJ      Full                                                        +---------+---------------+---------+-----------+----------+--------------+ FV Prox  Full                                                        +---------+---------------+---------+-----------+----------+--------------+ FV Mid   Full                                                        +---------+---------------+---------+-----------+----------+--------------+ FV DistalFull                                                        +---------+---------------+---------+-----------+----------+--------------+  PFV      Full                                                        +---------+---------------+---------+-----------+----------+--------------+ POP      Full           Yes      Yes                                 +---------+---------------+---------+-----------+----------+--------------+ PTV      Full                                                        +---------+---------------+---------+-----------+----------+--------------+ PERO     Full                                                        +---------+---------------+---------+-----------+----------+--------------+   +---------+---------------+---------+-----------+----------+--------------+ LEFT     CompressibilityPhasicitySpontaneityPropertiesThrombus Aging +---------+---------------+---------+-----------+----------+--------------+ CFV      Full           Yes      Yes                                 +---------+---------------+---------+-----------+----------+--------------+ SFJ      Full                                                         +---------+---------------+---------+-----------+----------+--------------+ FV Prox  Full                                                        +---------+---------------+---------+-----------+----------+--------------+ FV Mid   Full                                                        +---------+---------------+---------+-----------+----------+--------------+ FV DistalFull                                                        +---------+---------------+---------+-----------+----------+--------------+ PFV      Full                                                        +---------+---------------+---------+-----------+----------+--------------+  POP      Full           Yes      Yes                                 +---------+---------------+---------+-----------+----------+--------------+ PTV      Full                                                        +---------+---------------+---------+-----------+----------+--------------+ PERO     Full                                                        +---------+---------------+---------+-----------+----------+--------------+     Summary: RIGHT: - There is no evidence of deep vein thrombosis in the lower extremity.  - No cystic structure found in the popliteal fossa.  LEFT: - There is no evidence of deep vein thrombosis in the lower extremity.  - No cystic structure found in the popliteal fossa.  *See table(s) above for measurements and observations. Electronically signed by Deitra Mayo MD on 05/13/2022 at 1:34:49 PM.    Final      Assessment and Plan:   Acute Systolic Heart Failure  - Echo from 7/30 showed EF 30-35%, global hypokinesis, normal RV systolic function, no hemodynamically significant valvular abnormalities  - Of note, this echo was taken when patient was acutely septic and in shock (hypotensive and requiring pressors, tachycardic)  - Now, patient is significantly improved--  HR in the 100s, BNP stable off pressors  - Question whether reduced EF was caused by sepsis or represented postpartum cardiomyopathy. Prior to this admission, patient denies having any chest pain, palpitations, sob, ankle edema, orthopnea. When septic, did have some sob, chest tightness on deep inspiration.  - Repeat limited echo today pending  - If EF continues to be reduced, suggest repeating echo in 3 months. Low suspicion for ischemic cardiomyopathy as patient is very young and has limited risk factors for CAD  - Start metoprolol 12.5 mg BID-- discussed with pharmacy, metoprolol does enter the breast milk, but has one of the lowest transfers into the breast milk of all the BB. Believed to be compatible with breast feeding if patient continues to do so  - Additional GDMT may be limited by BP and renal function for now. Also, as patient is young and breast feeding/may have additional pregnancies in the future, would prefer to limit the amount of medications she is on   1 month post partum, breast-feeding  - Patient told me that she does not think she will continue breastfeeding after this admission  - Continue to pump and dump for now   AKI, ATN due to sepsis  - Creatinine peaked at 2.44 on 7/31, AKI felt to be prerenal in the setting of sepsis  - Now, creatinine improved to 1.18  - Avoid nephrotoxic medications until fully resolved   Persistent tachycardia  - Per telemetry, patient continues to be tachycardic with HR in the 90s-110s - Start low dose BB as above to help slow HR   Otherwise per primary - Sepesis due  to E. Coli bacteremia, pyelonephritis  - Chronic microcytic anemia - Abdominal and flank pain  - Constipation - Electrolyte abnormalities     Risk Assessment/Risk Scores:    New York Heart Association (NYHA) Functional Class NYHA Class III        For questions or updates, please contact [facility] Please consult [email or web address] for contact info under     Signed, Jonita Albee, PA-C  05/16/2022 12:20 PM  History and all data above reviewed.  Patient examined.  I agree with the findings as above.  The patient has no prior cardiac history.  She did relatively well with her pregnancy though she had some hypertension.  She has been home on nifedipine for control of her blood pressure.  She says her blood pressure is controlled when she takes this.  She has not had any cardiac work-up or history.  She is active and lives on [place] and has to carry groceries and now her 86-month-old up and down the stairs.  She denies any prior cardiovascular symptoms such as chest pressure, neck or arm discomfort.  She does not have any shortness of breath, PND or orthopnea.  She is never had palpitations, presyncope or syncope.  She was noted to have an ejection fraction was reduced as above.  I do note that the echo currently being done demonstrates ejection fraction seems to be back to normal.  The patient exam reveals COR: Regular rate and rhythm, no rubs, 2 out of 6 left upper sternal border systolic murmur nonradiating, no diastolic,  Lungs: Clear to auscultation bilaterally,  Abd: Positive bowel sounds normal frequency and pitch, Ext no edema, 2+ pulses.  All available labs, radiology testing, previous records reviewed. Agree with documented assessment and plan.  Cardiomyopathy: Suspect this was related to sepsis and her tachycardia.  Seems to have resolved.  She has had some hypertension and given this recent cardiomyopathy I would like to try a very low-dose beta-blocker in place of nifedipine.  I will start 12.5 mg twice daily as above given the fact that she may or may not be breast-feeding.  Otherwise I do not think further cardiovascular testing will be indicated.  We will set her up for follow-up in our cardio OB clinic.  Fayrene Fearing Juventino Pavone  3:15 PM  05/16/2022

## 2022-05-16 NOTE — Progress Notes (Addendum)
NAME:  Kathryn Pena, MRN:  [id number], DOB:  10/16/2000, LOS: 5 ADMISSION DATE:  05/10/2022, CONSULTATION DATE:  05/11/2022 REFERRING MD:  Dr. Allena Katz - [facility], CHIEF COMPLAINT:  sepsis   History of Present Illness:  Kathryn Pena is a 21 year old female with a past medical history for pregnancy-induced hypertension, recent group B streptococcus UTI, and is currently 1 month postpartum who presented to the emergency department 7/29 for complaints of acute onset back pain.  Per H&P patient was complaining of left-sided back pain that began 2 days prior to admission.  Back pain was also associated with dysuria and not relieved with over-the-counter medications.  Patient was admitted per hospitalist services for sepsis work-up where she was found to be E. coli bacteremia from likely pyelonephritis.  Work-up in ED included MRI cervical spine with possible left-sided pyelonephritis.   Afternoon of 7/30 PCCM called for worsening septic like picture.  Patient has had progressive hypotension with associated tachycardia and elevation and fever to Tmax 102.1.   Pertinent  Medical History  Pregnancy-induced hypertension, recent group B streptococcus UTI, and is currently 1 month postpartum  Significant Hospital Events: Including procedures, antibiotic start and stop dates in addition to other pertinent events   7/29 patient presented with left-sided back pain was found to have E. coli bacteremia likely in the setting of left-sided pyelonephritis seen on MRI 7/30 worsening septic shock including hypotension, tachycardia, and elevated fever  Interim History / Subjective:  VQ scan negative. Continues to fever with tachycardia.  No BM yesterday. Appetite improving, ate [employer]. Drinking fluids. Able to ambulate around unit yesterday, SOB improving. Back pain with movement. RUQ pain 5/10, LUQ pain 10/10.   Objective   Blood pressure 110/62, pulse (!) 107, temperature 99.8 F (37.7 C), temperature  source Oral, resp. rate (!) 24, weight 105.2 kg, last menstrual period 06/18/2021, SpO2 98 %, currently breastfeeding.        Intake/Output Summary (Last 24 hours) at 05/16/2022 1601 Last data filed at 05/16/2022 0533 Gross per 24 hour  Intake 899.96 ml  Output 2075 ml  Net -1175.04 ml    Filed Weights   05/13/22 0230  Weight: 105.2 kg   Examination: General: Non-toxic appearing, awake and NAD  HEENT: MMM, PERRL Neuro: A & O x3, No focal neuro deficit   CV: Regular Rhythm, Tachycardic, No murmurs PULM: CTAB, No wheezing or crackles  GI: soft, decreased BS, non-distended, CVA TTP Extremities: No BLE edema, well perfused  Skin: no rashes or lesions  Resolved Hospital Problem list   Lactic acidosis  Assessment & Plan:   Sepsis due to E coli bacteremia and pyelonephritis L pyelonephritis Shock has resolved. Continues to have persistent tachycardia. Abd [place], CXR, and VQ scan negative. Requiring higher doses of tylenol. Repeat Cx NGTD. Fevers much improved since stopping CTX. - continue FQ - Follow cultures - scheduled tylenol 1g Q6 hr for pain and fever - Monitor urine output  Acute kidney injury ATN due to sepsis AKI likely prerenal with subsequent ATN due to sepsis. Sepsis has resolved and UOP, Cr, and GFR improving.   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 Mild troponin elevation in the setting of sepsis. BNP elevation 2/2 acute cardiomyopathy. HF and recent infection likely explain tachycardia. - repeat ECHO.  - cardiology consulted for initiation of GDMT - telemtry monitoring   Chronic  microcytic anemia Continue to down-trend, may be related to infection, menstrual losses and post-partum. May also have component of hypersplenism. LDH elevated. - f/u haptoglobin - daily CBC - Transfuse <7 per protocol - could check iron  studies as outpatient  Constipation Immobility, pain regimen, and poor PO intake likely cause. Decreased bowel sounds on exam. -  scheduled Sen[place]ot to bowel regimen - added dulcolax  Abdominal and flank pain RUQ, LUQ, left flank. Pyelonephritis and hepatosplenomegaly. Morphine less desirable with impaired renal function.  Patient did not tolerate dilaudid.  - scheduled tylenol, oxycodone - continue fentanyl for breakthrough  Hypokalemia - replete - monitor daily  Hypocalcemia Stable - monitor - replete as necessary  Hypomagnesemia Stable.  - Trend - supplement as necessary  1 month postpartum and breast-feeding - pump and dump breastmilk while on FQ - prenatal vitamins - maintain hydration  RUQ abd pain No evidence of cholecystitis on RUQ and negative murphys sign. Hepatomegaly seen on CT.  - continue to monitor.   R Hydronephrosis R hydronephrosis likely related to pregnancy, and not pathologic.   Best Practice (right click and "Reselect all SmartList Selections" daily)   Diet/type: Regular consistency (see orders) DVT prophylaxis: other Lovenox GI prophylaxis: Zofran, protonix, miralax Lines: N/A Foley:  N/A Code Status:  full code Last date of multidisciplinary goals of care discussion [Patient updated bedside]  Labs   CBC: Recent Labs  Lab 05/10/22 2037 05/11/22 0610 05/12/22 [id number] 05/13/22 0103 05/14/22 0603 05/15/22 0733 05/16/22 0029  WBC 17.8* 15.7* 19.0* 13.1* 8.9 8.8 10.4  NEUTROABS 16.4* 14.7*  --   --   --   --   --   HGB 11.3* 10.8* 10.0* 10.3* 9.3* 8.8* 8.3*  HCT 35.6* 33.2* 30.3* 31.3* 28.3* 26.5* 24.9*  MCV 85.8 84.5 83.2 83.0 82.5 83.1 82.2  PLT 232 206 165 174 168 184 219     Basic Metabolic Panel: Recent Labs  Lab 05/11/22 0610 05/11/22 1500 05/12/22 0613 05/13/22 0103 05/14/22 0603 05/15/22 0733 05/16/22 0029  NA 136 136 139 136 136 137 136  K 3.9 3.7 4.0 3.5 3.4* 3.6 3.6  CL 105 104 108 105 106 106 106  CO2 23 21* 22  23 23 23  20*  GLUCOSE 137* 112* 110* 100* 95 100* 128*  BUN 15 19 24* 17 11 10 9   CREATININE 1.90* 2.18* 2.44* 1.94* 1.61* 1.45* 1.18*  CALCIUM 8.2* 8.0* 8.0* 8.3* 8.2* 8.3* 8.3*  MG 1.2* 1.8 1.7 2.1 2.0  --   --   PHOS 2.5  --   --   --   --   --   --     GFR: Estimated Creatinine Clearance: 94.9 mL/min (A) (by C-G formula based on SCr of 1.18 mg/dL (H)). Recent Labs  Lab 05/10/22 2037 05/11/22 0610 05/11/22 1500 05/12/22 0613 05/12/22 1142 05/13/22 0103 05/14/22 0603 05/15/22 0733 05/16/22 0029  WBC 17.8*   < >  --    < >  --  13.1* 8.9 8.8 10.4  LATICACIDVEN 1.2  --  2.7*  --  2.1*  --   --   --   --    < > = values in this interval not displayed.     Liver Function Tests: Recent Labs  Lab 05/10/22 2037 05/11/22 0610  AST 18 19  ALT 17 17  ALKPHOS 68 62  BILITOT 1.2 0.6  PROT 6.5 6.0*  ALBUMIN 3.3* 2.8*    Recent Labs  Lab 05/11/22 1500 05/12/22 1142  LIPASE 20 17    No results for input(s): "AMMONIA" in the last 168 hours.  ABG No results found for: "PHART", "PCO2ART", "PO2ART", "HCO3", "TCO2", "ACIDBASEDEF", "O2SAT"   Coagulation Profile: Recent Labs  Lab 05/10/22 2037  INR 1.1     Cardiac Enzymes: No results for input(s): "CKTOTAL", "CKMB", "CKMBINDEX", "TROPONINI" in the last 168 hours.  HbA1C: No results found for: "HGBA1C"  CBG: Recent Labs  Lab 05/11/22 1621  GLUCAP 100*     Review of Systems:     Past Medical History:  She,  has a past medical history of Pregnancy induced hypertension.   Surgical History:   Past Surgical History:  Procedure Laterality Date   NO PAST SURGERIES       Social History:   reports that she quit smoking about 3 years ago. Her smoking use included cigars. She has never used smokeless tobacco. She reports that she does not drink alcohol and does not use drugs.   Family History:  Her family history includes Healthy in her mother; Hypertension in her maternal aunt. There is no history of  Diabetes, Heart disease, or Stroke.   Allergies Allergies  Allergen Reactions   Dilaudid [Hydromorphone Hcl] Nausea Only, Anxiety and Other (See Comments)    Nightmares, feeling of heart 'racing'     Home Medications  Prior to Admission medications   Medication Sig Start Date End Date Taking? Authorizing Provider  acetaminophen (TYLENOL) 500 MG tablet Take 500 mg by mouth every 6 (six) hours as needed for moderate pain.   Yes [provider]  ibuprofen (ADVIL) 200 MG tablet Take 200 mg by mouth every 6 (six) hours as needed for moderate pain.   Yes [provider]  NIFEdipine (ADALAT CC) 30 MG 24 hr tablet Take 1 tablet (30 mg total) by mouth daily. 04/16/22  Yes Levie Heritage, DO  acetaminophen (TYLENOL) 500 MG tablet Take 2 tablets (1,000 mg total) by mouth every 6 (six) hours as needed for moderate pain. Patient not taking: Reported on 05/10/2022 09/10/21   Donette Larry, CNM  furosemide (LASIX) 20 MG tablet Take 1 tablet (20 mg total) by mouth daily for 4 days. 04/14/22 04/18/22  Carlynn Herald, CNM  ibuprofen (ADVIL) 600 MG tablet Take 1 tablet (600 mg total) by mouth every 6 (six) hours as needed. Patient not taking: Reported on 05/10/2022 04/14/22   Carlynn Herald, CNM  ibuprofen (ADVIL) 600 MG tablet Take 1 tablet (600 mg total) by mouth every 6 (six) hours. Patient not taking: Reported on 05/10/2022 04/15/22   Levie Heritage, DO  Prenatal Vit-Fe Phos-FA-Omega (VITAFOL GUMMIES) 3.33-0.333-34.8 MG CHEW Chew 1 tablet by mouth daily. Patient not taking: Reported on 05/10/2022 09/02/21   Constant, Peggy, MD  enalapril (VASOTEC) 5 MG tablet Take 1 tablet (5 mg total) by mouth daily. 02/24/19 12/21/19  Hermina Staggers, MD     Critical care time: 34      I personally saw the patient and performed a substantive portion of this encounter, including a complete performance of at least one of the key components (MDM, Hx and/or Exam), in conjunction with the  resident.  Fever curve trending down, Tmax of 100.5 this morning, now defervesced. Remains tachycardic low 100s, blood pressure improved, 99% on room air. States left flank pain is improved to 6/10  On exam -no accessory muscle use, clear breath sounds bilateral, soft nontender abdomen , no edema, S1-S2 regular.  Labs show mild hypokalemia, creatinine decreasing to  1.2, procalcitonin 4.7, mild leukocytosis, hemoglobin trending down to 8.3.  Impression/plan Acute E. coli pyelonephritis -persistent fevers but now trending down, antibiotics changed from ceftriaxone to Levaquin -No other cause of fever identified, no evidence of mastitis , she is not breast-feeding but continues to pump and dump. -Hydronephrosis is mild and does not need intervention, no evidence of perinephric abscess  Postpartum cardiomyopathy, HFrEF -no evidence of overt failure but will involve cardiology to initiate goal-directed therapy  Anemia -no evidence of blood loss, normochromic, slight high LDH raises question of hemolysis, haptoglobin pending, will check iron panel.  Okay to transfer to telemetry and to triad 8/5  Consuelo Thayne V. AlvaM D

## 2022-05-16 NOTE — Progress Notes (Signed)
    [facility]    Date of Admission:  05/10/2022   Total days of antibiotics 7/ day 2 of levofloxacin           ID: Kathryn Pena is a 21 y.o. female with  severe sepsis from ecoli pyelonephritis and bacteremia  Principal Problem:   Pyelonephritis    Subjective: Afebrile, but some drenching sweats. No flank pain today, overall feeling improved. Having decrease breastmilk production  Medications:   acetaminophen  1,000 mg Oral TID   Breast Milk   Feeding See admin instructions   Chlorhexidine Gluconate Cloth  6 each Topical Daily   enoxaparin (LOVENOX) injection  50 mg Subcutaneous Q24H   prenatal vitamin w/FE, FA  1 tablet Oral Q1200   senna  1 tablet Oral Daily    Objective: Vital signs in last 24 hours: Temp:  [98.4 F (36.9 C)-100.5 F (38.1 C)] 99.1 F (37.3 C) (08/04 1103) Pulse Rate:  [84-140] 104 (08/04 0800) Resp:  [16-43] 17 (08/04 1200) BP: (92-127)/(50-96) 120/91 (08/04 1200) SpO2:  [23 %-100 %] 96 % (08/04 0800) Physical Exam  Constitutional:  oriented to person, place, and time. appears well-developed and well-nourished. No distress.  HENT: [place]/AT, PERRLA, no scleral icterus Mouth/Throat: Oropharynx is clear and moist. No oropharyngeal exudate.  Cardiovascular: Normal rate, regular rhythm and normal heart sounds. Exam reveals no gallop and no friction rub.  Pulm = no acc muscle use, no tachypnea Ext: no c/c/e Neurological: alert and oriented to person, place, and time.  Skin: Skin is warm and dry. No rash noted. No erythema.  Psychiatric: a normal mood and affect.  behavior is normal.    Lab Results Recent Labs    05/15/22 0733 05/16/22 0029  WBC 8.8 10.4  HGB 8.8* 8.3*  HCT 26.5* 24.9*  NA 137 136  K 3.6 3.6  CL 106 106  CO2 23 20*  BUN 10 9  CREATININE 1.45* 1.18*     Microbiology: Escherichia coli      MIC    AMPICILLIN <=2 SENSITIVE  Sensitive    AMPICILLIN/SULBACTAM <=2 SENSITIVE  Sensitive     CEFAZOLIN <=4 SENSITIVE  Sensitive    CEFEPIME <=0.12 SENS... Sensitive    CEFTRIAXONE <=0.25 SENS... Sensitive    CIPROFLOXACIN <=0.25 SENS... Sensitive    GENTAMICIN <=1 SENSITIVE  Sensitive    IMIPENEM <=0.25 SENS... Sensitive    NITROFURANTOIN <=16 SENSIT... Sensitive    PIP/TAZO <=4 SENSITIVE  Sensitive    TRIMETH/SULFA <=20 SENSIT... Sensitive    Studies/Results: No results found.   Assessment/Plan: Fevers while on adequate treatment for ecoli pyelonephritis and bacteremia = appears to be trending downward. Unclear if she is slow to clear infection vs. Drug fever with beta-lactams. Continue with levofloxacin. Recommend to continue for 6 more days. Using yesterday, 8/3 as day 1 since afebrile, trending downward. Continue with supportive care with anti-pyretics  Breast feeding = recommend to pump-dump breastmilk while on levofloxacin then can resume breastmilk feeding to her newborn  Pyelonephritis-ecoli bacteremia = will be treated with levo through 05/21/22  Will sign off.  [facility] Pager: [phone]  05/16/2022, 1:16 PM

## 2022-05-16 NOTE — Progress Notes (Signed)
  Echocardiogram 2D Echocardiogram has been performed.  Delcie Roch 05/16/2022, 3:12 PM

## 2022-05-17 DIAGNOSIS — D508 Other iron deficiency anemias: Secondary | ICD-10-CM | POA: Diagnosis not present

## 2022-05-17 DIAGNOSIS — N12 Tubulo-interstitial nephritis, not specified as acute or chronic: Secondary | ICD-10-CM | POA: Diagnosis not present

## 2022-05-17 DIAGNOSIS — I5021 Acute systolic (congestive) heart failure: Secondary | ICD-10-CM | POA: Insufficient documentation

## 2022-05-17 DIAGNOSIS — I5022 Chronic systolic (congestive) heart failure: Secondary | ICD-10-CM

## 2022-05-17 DIAGNOSIS — D509 Iron deficiency anemia, unspecified: Secondary | ICD-10-CM

## 2022-05-17 DIAGNOSIS — N179 Acute kidney failure, unspecified: Secondary | ICD-10-CM | POA: Diagnosis not present

## 2022-05-17 LAB — CULTURE, BLOOD (ROUTINE X 2)
Culture: NO GROWTH
Culture: NO GROWTH
Special Requests: ADEQUATE
Special Requests: ADEQUATE

## 2022-05-17 LAB — CBC
HCT: 22.8 % — ABNORMAL LOW (ref 36.0–46.0)
Hemoglobin: 7.7 g/dL — ABNORMAL LOW (ref 12.0–15.0)
MCH: 27.7 pg (ref 26.0–34.0)
MCHC: 33.8 g/dL (ref 30.0–36.0)
MCV: 82 fL (ref 80.0–100.0)
Platelets: 333 10*3/uL (ref 150–400)
RBC: 2.78 MIL/uL — ABNORMAL LOW (ref 3.87–5.11)
RDW: 22.1 % — ABNORMAL HIGH (ref 11.5–15.5)
WBC: 11.5 10*3/uL — ABNORMAL HIGH (ref 4.0–10.5)
nRBC: 0 % (ref 0.0–0.2)

## 2022-05-17 LAB — BASIC METABOLIC PANEL
Anion gap: 8 (ref 5–15)
BUN: 5 mg/dL — ABNORMAL LOW (ref 6–20)
CO2: 22 mmol/L (ref 22–32)
Calcium: 8.2 mg/dL — ABNORMAL LOW (ref 8.9–10.3)
Chloride: 106 mmol/L (ref 98–111)
Creatinine, Ser: 1.09 mg/dL — ABNORMAL HIGH (ref 0.44–1.00)
GFR, Estimated: >60 mL/min (ref >60)
Glucose, Bld: 110 mg/dL — ABNORMAL HIGH (ref 70–99)
Potassium: 3.7 mmol/L (ref 3.5–5.1)
Sodium: 136 mmol/L (ref 135–145)

## 2022-05-17 MED ORDER — IBUPROFEN 600 MG PO TABS
600.0000 mg | ORAL_TABLET | Freq: Four times a day (QID) | ORAL | Status: DC | PRN
Start: 2022-05-17 — End: 2022-05-18
  Administered 2022-05-17 – 2022-05-18 (×2): 600 mg via ORAL
  Filled 2022-05-17 (×2): qty 1

## 2022-05-17 MED ORDER — OXYCODONE HCL 5 MG PO TABS
5.0000 mg | ORAL_TABLET | Freq: Four times a day (QID) | ORAL | Status: DC | PRN
  Administered 2022-05-17: 5 mg via ORAL
  Filled 2022-05-17: qty 1

## 2022-05-17 MED ORDER — FENTANYL CITRATE PF 50 MCG/ML IJ SOSY
25.0000 ug | PREFILLED_SYRINGE | Freq: Once | INTRAMUSCULAR | Status: AC
  Administered 2022-05-17: 25 ug via INTRAVENOUS
  Filled 2022-05-17: qty 1

## 2022-05-17 MED ORDER — ACETAMINOPHEN 325 MG PO TABS
650.0000 mg | ORAL_TABLET | Freq: Four times a day (QID) | ORAL | Status: DC
  Administered 2022-05-17 – 2022-05-18 (×3): 650 mg via ORAL
  Filled 2022-05-17 (×3): qty 2

## 2022-05-17 MED ORDER — FERROUS SULFATE 325 (65 FE) MG PO TABS
325.0000 mg | ORAL_TABLET | Freq: Every day | ORAL | Status: DC
  Administered 2022-05-18: 325 mg via ORAL
  Filled 2022-05-17: qty 1

## 2022-05-17 NOTE — Hospital Course (Addendum)
Kathryn Pena was admitted to the hospital with the working diagnosis of severe sepsis due to pyelonephritis present on admission.   21 yo female with the past medical history of hypertension, and obesity who presented with left lower back pain. Reported 2 days of back pain associated with dysuria. She is post partum less than 1 mo ago. On her initial physical examination her temp was 102,1, BP 115/73, HR 124 RR 22 and 02 saturation 100%, lungs with no wheezing or rales, heart with S1 and S2 present and tachycardic, abdomen not tender, positive left flank tenderness, no lower extremity edema.   Na 139, K 3,5 Cl 108, bicarbonate 23, glucose 101 bun 12 cr 1,25 Wbc 17,8 hgb 11.3 plt 232  Urine analysis SG 1,013, >300 protein, >50 wbc  Urine culture and blood culture positive for E coli sensitive to cephalosporins.   Spine MRI with no signs of acute infection in the cervical, thoracic or lumbar spine.  Left kidney with perinephric stranding.   Chest radiograph with no infiltrates, no cardiomegaly  EKG with 130 bpm, normal axis, normal intervals, sinus rhythm with q wave in lead II, III, AvF with no significant ST segment or T wave changes.   Patient was placed on IV fluids and broad spectrum antibiotic therapy.   07/30 she had worsening hypotension and tachycardia, along with persistent fever. Transferred to the ICU  Echocardiogram with reduced LV systolic function.   08/04 repeat echocardiogram with recovery of LV systolic function.  08/05 transferred to [facility].  08/06 she has been 24 hrs afebrile, plan to continue oral antibiotic therapy with levofloxacin and follow up as outpatient.

## 2022-05-17 NOTE — Assessment & Plan Note (Addendum)
Severe sepsis due to pyelonephritis.  E coli bacteremia, with end organ failure hypotension. Present on admission.   Positive urine and blood cultures to E coli, sensitive to cephalosporins, ampicillin and flouroquinolones.   Patient initially treated with ceftriaxone due to persistent fever it was changed to levofloxacin with good toleration.  At the time of her discharge she has been 24 hrs with no fever.   Plan to continue antibiotic therapy with levofloxacin. Continue as needed acetaminophen and low dose of ibuprofen.

## 2022-05-17 NOTE — Assessment & Plan Note (Addendum)
ATN due to sepsis.  Hypokalemia, hypocalcemia, hypomagnesemia  Renal function with serum cr at 1,0 with K at 3,7 and serum bicarbonate at 22. Renal failure has resolved, patient is tolerating po diet well.

## 2022-05-17 NOTE — Progress Notes (Addendum)
MD notified of increase temp. Pt on scheduled tylenol 1000mg . No PO meds given at this time. Ice packs applied to posterior neck, under bilateral arms, and groin. Pt socks were removed and fan angled at feet.    K pad was ordered for constant back pain, but has not been applied d/t temp.

## 2022-05-17 NOTE — Assessment & Plan Note (Addendum)
Echocardiogram with EF 30 to 35% with global hypokinesis, due to sepsis.  Consistent with acute systolic heart failure (not chronic).   Positive acute exacerbation with hypotension.  Follow up echocardiogram with improved LV EF 55 to 60%m with no wall motion abnormalities. RV systolic function preserved. RVSP 23,8 mmHg. No significant valvular disease.   Systolic blood pressure is 127 mmHg.  Plan to continue with metoprolol 12,5 mg bid.  Follow up with cardiology as outpatient.

## 2022-05-17 NOTE — Progress Notes (Signed)
Progress Note   Patient: Kathryn Pena QZR:[id number] DOB: 12-13-2000 DOA: 05/10/2022     6 DOS: the patient was seen and examined on 05/17/2022   Brief hospital course: Mrs. Klett was admitted to the hospital with the working diagnosis of severe sepsis due to pyelonephritis present on admission.   21 yo female with the past medical history of hypertension, and obesity who presented with left lower back pain. Reported 2 days of back pain associated with dysuria. She is post partum less than 1 mo ago. On her initial physical examination her temp was 102,1, BP 115/73, HR 124 RR 22 and 02 saturation 100%, lungs with no wheezing or rales, heart with S1 and S2 present and tachycardic, abdomen not tender, positive left flank tenderness, no lower extremity edema.   Na 139, K 3,5 Cl 108, bicarbonate 23, glucose 101 bun 12 cr 1,25 Wbc 17,8 hgb 11.3 plt 232  Urine analysis SG 1,013, >300 protein, >50 wbc  Urine culture and blood culture positive for E coli sensitive to cephalosporins.   Spine MRI with no signs of acute infection in the cervical, thoracic or lumbar spine.  Left kidney with perinephric stranding.   Chest radiograph with no infiltrates, no cardiomegaly  EKG with 130 bpm, normal axis, normal intervals, sinus rhythm with q wave in lead II, III, AvF with no significant ST segment or T wave changes.   Patient was placed on IV fluids and broad spectrum antibiotic therapy.   07/30 she had worsening hypotension and tachycardia, along with persistent fever. Transferred to the ICU   08/05 transferred to [facility]   Assessment and Plan: * Pyelonephritis Severe sepsis due to pyelonephritis.  E coli bacteremia, with end organ failure hypotension. Present on admission.   Wbc is trending down today is 11,5.  Temp 102.1 max  Plan to continue antibiotic therapy with levofloxacin. Possible antibiotic fever due to cephalosporins.  Continue with as needed ibuprofen, change acetaminophen to  scheduled.  Fever possible from breast milk production.      AKI (acute kidney injury) (HCC) ATN due to sepsis.  Hypokalemia, hypocalcemia, hypomagnesemia  Renal function with serum cr at 1,0 with K at 3,7 and serum bicarbonate at 22.  Plan to continue close follow up renal function and electrolytes.   Chronic systolic CHF (congestive heart failure) (HCC) Echocardiogram with EF 30 to 35% with global hypokinesis, due to sepsis.  Follow up echocardiogram with improved LV EF 55 to 60%m with no wall motion abnormalities. RV systolic function preserved. RVSP 23,8 mmHg. No significant valvular disease.   Systolic blood pressure is 127 mmHg.  Plan to continue close blood pressure monitoring Patient is euvolemic.  Continue with metoprolol 12,5 mg bid.   Iron deficiency anemia Iron panel with profound iron deficiency anemia with serum iron 8, tibc 211 and transferrin saturation 4.  Plan to continue with oral iron supplementation for now Hold on IV iron until sepsis resolved.         Subjective: Patient is feeling better, she continue to have pain and fever, no chest pain or dyspnea.   Physical Exam: Vitals:   05/16/22 2134 05/17/22 0515 05/17/22 0655 05/17/22 0954  BP: 128/74 126/73  127/65  Pulse: 100 (!) 104  100  Resp: 18 20  18   Temp: (!) 101.1 F (38.4 C) (S) (!) 101.6 F (38.7 C) (!) 102.1 F (38.9 C) 98.8 F (37.1 C)  TempSrc: Oral  Oral Oral  SpO2: 100% 100%  99%  Weight:  Height:       Neurology awake and alert ENT with mild pallor Cardiovascular with S1 and S2 present and rhythmic with no gallops Respiratory with no raled or wheezing Abdomen not distended  No lower extremity edema  Data Reviewed:    Family Communication: no family at the bedside   Disposition: Status is: Inpatient Remains inpatient appropriate because: fever   Planned Discharge Destination: Home     Author: Coralie Keens, MD 05/17/2022 2:53 PM  For on call review  [email or web address].

## 2022-05-17 NOTE — Progress Notes (Signed)
MD paged for temp 102.1. Prn order for ibuprofen placed.

## 2022-05-17 NOTE — Assessment & Plan Note (Addendum)
Iron panel with profound iron deficiency anemia with serum iron 8, tibc 211 and transferrin saturation 4.  Patient will continue with oral iron supplementation, follow up iron panel in 2 to 3 weeks if persistent low Iron stores to consider IV iron.

## 2022-05-18 DIAGNOSIS — D508 Other iron deficiency anemias: Secondary | ICD-10-CM | POA: Diagnosis not present

## 2022-05-18 DIAGNOSIS — N179 Acute kidney failure, unspecified: Secondary | ICD-10-CM | POA: Diagnosis not present

## 2022-05-18 DIAGNOSIS — I5022 Chronic systolic (congestive) heart failure: Secondary | ICD-10-CM | POA: Diagnosis not present

## 2022-05-18 DIAGNOSIS — N12 Tubulo-interstitial nephritis, not specified as acute or chronic: Secondary | ICD-10-CM | POA: Diagnosis not present

## 2022-05-18 LAB — HEMOGLOBIN AND HEMATOCRIT, BLOOD
HCT: 25.3 % — ABNORMAL LOW (ref 36.0–46.0)
Hemoglobin: 8.2 g/dL — ABNORMAL LOW (ref 12.0–15.0)

## 2022-05-18 MED ORDER — COMPLETENATE 29-1 MG PO CHEW: 1.0000 | CHEWABLE_TABLET | Freq: Every day | ORAL | 0 refills | Status: DC

## 2022-05-18 MED ORDER — LEVOFLOXACIN 750 MG PO TABS: 750.0000 mg | ORAL_TABLET | Freq: Every day | ORAL | 0 refills | Status: DC

## 2022-05-18 MED ORDER — LEVOFLOXACIN 750 MG PO TABS
750.0000 mg | ORAL_TABLET | Freq: Every day | ORAL | Status: DC
  Administered 2022-05-18: 750 mg via ORAL
  Filled 2022-05-18: qty 1
  Filled 2022-05-18: qty 2

## 2022-05-18 MED ORDER — FERROUS SULFATE 325 (65 FE) MG PO TABS: 325.0000 mg | ORAL_TABLET | Freq: Every day | ORAL | 0 refills | Status: DC

## 2022-05-18 MED ORDER — METOPROLOL TARTRATE 25 MG PO TABS: 12.5000 mg | ORAL_TABLET | Freq: Two times a day (BID) | ORAL | 0 refills | Status: DC

## 2022-05-18 MED ORDER — LEVOFLOXACIN 750 MG PO TABS: 750.0000 mg | ORAL_TABLET | Freq: Every day | ORAL | 0 refills | Status: AC

## 2022-05-18 NOTE — Progress Notes (Signed)
DISCHARGE NOTE HOME Kathryn Pena to be discharged Home per MD order. Discussed prescriptions and follow up appointments with the patient. Prescriptions given to patient; medication list explained in detail. Patient verbalized understanding.  Skin clean, dry and intact without evidence of skin break down, no evidence of skin tears noted. IV catheter discontinued intact. Site without signs and symptoms of complications. Dressing and pressure applied. Pt denies pain at the site currently. No complaints noted.  Patient free of lines, drains, and wounds.   An After Visit Summary (AVS) was printed and given to the patient. Patient escorted via wheelchair, and discharged home via private auto.  Lorine Bears, RN

## 2022-05-18 NOTE — Discharge Summary (Addendum)
Physician Discharge Summary   Patient: Kathryn Pena MRN: [id number] DOB: April 03, 2001  Admit date:     05/10/2022  Discharge date: 05/18/22  Discharge Physician: York Ram Kiet Geer   PCP: Patient, No Pcp Per   Recommendations at discharge:    Patient will continue antibiotic therapy with oral levofloxacin 750 mg until 05/21/22. Continue with as needed acetaminophen and ibuprofen.  Continue metoprolol 12,5 mg bid for cardiomyopathy Follow up with cardiology as outpatient.  Breast feeding = recommend to pump-dump breastmilk while on levofloxacin then can resume breastmilk feeding to her newborn  Discharge Diagnoses: Principal Problem:   Pyelonephritis Active Problems:   AKI (acute kidney injury) (HCC)   Chronic systolic CHF (congestive heart failure) (HCC)   Iron deficiency anemia  Resolved Problems:   * No resolved hospital problems. [facility] Course: Mrs. Eddington was admitted to the hospital with the working diagnosis of severe sepsis due to pyelonephritis present on admission.   21 yo female with the past medical history of hypertension, and obesity who presented with left lower back pain. Reported 2 days of back pain associated with dysuria. She is post partum less than 1 mo ago. On her initial physical examination her temp was 102,1, BP 115/73, HR 124 RR 22 and 02 saturation 100%, lungs with no wheezing or rales, heart with S1 and S2 present and tachycardic, abdomen not tender, positive left flank tenderness, no lower extremity edema.   Na 139, K 3,5 Cl 108, bicarbonate 23, glucose 101 bun 12 cr 1,25 Wbc 17,8 hgb 11.3 plt 232  Urine analysis SG 1,013, >300 protein, >50 wbc  Urine culture and blood culture positive for E coli sensitive to cephalosporins.   Spine MRI with no signs of acute infection in the cervical, thoracic or lumbar spine.  Left kidney with perinephric stranding.   Chest radiograph with no infiltrates, no cardiomegaly  EKG with 130 bpm, normal  axis, normal intervals, sinus rhythm with q wave in lead II, III, AvF with no significant ST segment or T wave changes.   Patient was placed on IV fluids and broad spectrum antibiotic therapy.   07/30 she had worsening hypotension and tachycardia, along with persistent fever. Transferred to the ICU  Echocardiogram with reduced LV systolic function.   08/04 repeat echocardiogram with recovery of LV systolic function.  08/05 transferred to [facility].  08/06 she has been 24 hrs afebrile, plan to continue oral antibiotic therapy with levofloxacin and follow up as outpatient.   Assessment and Plan: * Pyelonephritis Severe sepsis due to pyelonephritis.  E coli bacteremia, with end organ failure hypotension. Present on admission.   Positive urine and blood cultures to E coli, sensitive to cephalosporins, ampicillin and flouroquinolones.   Patient initially treated with ceftriaxone due to persistent fever it was changed to levofloxacin with good toleration.  At the time of her discharge she has been 24 hrs with no fever.   Plan to continue antibiotic therapy with levofloxacin. Continue as needed acetaminophen and low dose of ibuprofen.      AKI (acute kidney injury) (HCC) ATN due to sepsis.  Hypokalemia, hypocalcemia, hypomagnesemia  Renal function with serum cr at 1,0 with K at 3,7 and serum bicarbonate at 22. Renal failure has resolved, patient is tolerating po diet well.   Acute clinical systolic heart failure (HCC) Echocardiogram with EF 30 to 35% with global hypokinesis, due to sepsis.  Consistent with acute systolic heart failure (not chronic).   Positive acute exacerbation with hypotension.  Follow  up echocardiogram with improved LV EF 55 to 60%m with no wall motion abnormalities. RV systolic function preserved. RVSP 23,8 mmHg. No significant valvular disease.   Systolic blood pressure is 127 mmHg.  Plan to continue with metoprolol 12,5 mg bid.  Follow up with cardiology as  outpatient.   Iron deficiency anemia Iron panel with profound iron deficiency anemia with serum iron 8, tibc 211 and transferrin saturation 4.  Patient will continue with oral iron supplementation, follow up iron panel in 2 to 3 weeks if persistent low Iron stores to consider IV iron.          Consultants: cardiology, ID Procedures performed: none   Disposition: Home Diet recommendation: regular  DISCHARGE MEDICATION: Allergies as of 05/18/2022       Reactions   Dilaudid [hydromorphone Hcl] Nausea Only, Anxiety, Other (See Comments)   Nightmares, feeling of heart 'racing'        Medication List     STOP taking these medications    furosemide 20 MG tablet Commonly known as: Lasix   NIFEdipine 30 MG 24 hr tablet Commonly known as: ADALAT CC   Vitafol Gummies 3.33-0.333-34.8 MG Chew       TAKE these medications    acetaminophen 500 MG tablet Commonly known as: TYLENOL Take 500 mg by mouth every 6 (six) hours as needed for moderate pain. What changed: Another medication with the same name was removed. Continue taking this medication, and follow the directions you see here.   ferrous sulfate 325 (65 FE) MG tablet Take 1 tablet (325 mg total) by mouth daily with breakfast. Start taking on: May 19, 2022   ibuprofen 200 MG tablet Commonly known as: ADVIL Take 200 mg by mouth every 6 (six) hours as needed for moderate pain. What changed: Another medication with the same name was removed. Continue taking this medication, and follow the directions you see here.   levofloxacin 750 MG tablet Commonly known as: LEVAQUIN Take 1 tablet (750 mg total) by mouth daily for 3 days. Start taking on: May 19, 2022   metoprolol tartrate 25 MG tablet Commonly known as: LOPRESSOR Take 0.5 tablets (12.5 mg total) by mouth 2 (two) times daily.   prenatal vitamin w/FE, FA 29-1 MG Chew chewable tablet Chew 1 tablet by mouth daily at 12 noon.        Discharge  Exam: Filed Weights   05/13/22 0230 05/16/22 1542  Weight: 105.2 kg 101 kg   BP 119/75 (BP Location: Right Arm)   Pulse 88   Temp 98.4 F (36.9 C) (Oral)   Resp 18   Ht 5\' 7"  (1.702 m)   Wt 101 kg   LMP 06/18/2021 (Approximate)   SpO2 96%   Breastfeeding Yes   BMI 34.87 kg/m   Patient is feeling better, pain is well controlled, with no dyspnea or edema, she has been afebrile.   Neurology awake and alert ENT with mild pallor Cardiovascular with S1 and S2 present and rhythmic with no gallops, rubs or murmurs Respiratory with no rales or wheezing Abdomen not distended No lower extremity edema   Condition at discharge: stable  The results of significant diagnostics from this hospitalization (including imaging, microbiology, ancillary and laboratory) are listed below for reference.   Imaging Studies: ECHOCARDIOGRAM LIMITED  Result Date: 05/16/2022    ECHOCARDIOGRAM LIMITED REPORT   Patient Name:   Kathryn Pena Date of Exam: 05/16/2022 Medical Rec #:  [id number]  Height:       68.0 in Accession #:    [id number]         Weight:       231.9 lb Date of Birth:  04-Sep-2001          BSA:          2.177 m Patient Age:    21 years           BP:           124/79 mmHg Patient Gender: F                  HR:           97 bpm. Exam Location:  Inpatient Procedure: Limited Echo, Cardiac Doppler and Limited Color Doppler Indications:    other abnormalities of the heart  History:        Patient has prior history of Echocardiogram examinations, most                 recent 05/11/2022. Sepsis, Signs/Symptoms:Bacteremia; Risk                 Factors:Hypertension.  Sonographer:    Delcie Roch RDCS Referring Phys: [id number] LAURA P CLARK IMPRESSIONS  1. Left ventricular ejection fraction, by estimation, is 55 to 60%. The left ventricle has normal function. The left ventricle has no regional wall motion abnormalities. Left ventricular diastolic parameters were normal.  2. Right ventricular systolic  function is normal. The right ventricular size is normal. There is normal pulmonary artery systolic pressure. The estimated right ventricular systolic pressure is 23.8 mmHg.  3. The mitral valve is normal in structure. Trivial mitral valve regurgitation. No evidence of mitral stenosis. No mitral valve vegetation.  4. The aortic valve is tricuspid. Aortic valve regurgitation is not visualized. No aortic stenosis is present. No aortic valve vegetation.  5. No tricuspid valve vegetation. Tricuspid valve regurgitation is trivial.  6. No pulmonary valve vegetation.  7. The inferior vena cava is normal in size with greater than 50% respiratory variability, suggesting right atrial pressure of 3 mmHg. FINDINGS  Left Ventricle: Left ventricular ejection fraction, by estimation, is 55 to 60%. The left ventricle has normal function. The left ventricle has no regional wall motion abnormalities. The left ventricular internal cavity size was normal in size. There is  no left ventricular hypertrophy. Left ventricular diastolic parameters were normal. Right Ventricle: The right ventricular size is normal. No increase in right ventricular wall thickness. Right ventricular systolic function is normal. There is normal pulmonary artery systolic pressure. The tricuspid regurgitant velocity is 2.28 m/s, and  with an assumed right atrial pressure of 3 mmHg, the estimated right ventricular systolic pressure is 23.8 mmHg. Pericardium: Trivial pericardial effusion is present. Mitral Valve: The mitral valve is normal in structure. Trivial mitral valve regurgitation. No evidence of mitral valve stenosis. Tricuspid Valve: No tricuspid valve vegetation. The tricuspid valve is normal in structure. Tricuspid valve regurgitation is trivial. Aortic Valve: The aortic valve is tricuspid. Aortic valve regurgitation is not visualized. No aortic stenosis is present. Pulmonic Valve: No pulmonary valve vegetation. The pulmonic valve was normal in  structure. Pulmonic valve regurgitation is trivial. Venous: The inferior vena cava is normal in size with greater than 50% respiratory variability, suggesting right atrial pressure of 3 mmHg. LEFT VENTRICLE PLAX 2D LVIDd:         5.40 cm Diastology LVIDs:         3.70 cm LV e' medial:  12.60 cm/s LV PW:         1.10 cm LV E/e' medial:  8.7 LV IVS:        0.80 cm LV e' lateral:   13.20 cm/s                        LV E/e' lateral: 8.3  IVC IVC diam: 2.30 cm LEFT ATRIUM         Index LA diam:    3.80 cm 1.75 cm/m  MITRAL VALVE                TRICUSPID VALVE MV Area (PHT): 5.62 cm     TR Peak grad:   20.8 mmHg MV Decel Time: 135 msec     TR Vmax:        228.00 cm/s MV E velocity: 110.00 cm/s MV A velocity: 75.70 cm/s MV E/A ratio:  1.45 Dalton McleanMD Electronically signed by Wilfred Lacy Signature Date/Time: 05/16/2022/3:50:46 PM    Final    NM Pulmonary Perfusion  Result Date: 05/14/2022 CLINICAL DATA:  Pulmonary embolus suspected EXAM: NUCLEAR MEDICINE PERFUSION LUNG SCAN TECHNIQUE: Perfusion images were obtained in multiple projections after intravenous injection of radiopharmaceutical. Ventilation scans intentionally deferred if perfusion scan and chest x-ray adequate for interpretation during COVID 19 epidemic. RADIOPHARMACEUTICALS:  4.2 mCi Tc-53m MAA IV COMPARISON:  None Available. FINDINGS: No segmental perfusion defects. IMPRESSION: Pulmonary embolus absent (normal). Electronically Signed   By: Allegra Lai M.D.   On: 05/14/2022 13:12   DG CHEST PORT 1 VIEW  Result Date: 05/14/2022 CLINICAL DATA:  Tachycardia, back pain. EXAM: PORTABLE CHEST 1 VIEW COMPARISON:  May 10, 2022. FINDINGS: The heart size and mediastinal contours are within normal limits. Both lungs are clear. The visualized skeletal structures are unremarkable. IMPRESSION: No active disease. Electronically Signed   By: Lupita Raider M.D.   On: 05/14/2022 11:21   US Abdomen Complete  Result Date: 05/13/2022 CLINICAL DATA:   Right upper quadrant pain and pyelonephritis EXAM: ABDOMEN ULTRASOUND COMPLETE COMPARISON:  CT abdomen pelvis 05/11/2022 FINDINGS: Gallbladder: No gallstones or wall thickening visualized. No sonographic Murphy sign noted by sonographer. Common bile duct: Diameter: 4 mm. No intrahepatic biliary ductal dilatation. Liver: No focal lesion identified. The liver is somewhat heterogeneous but within normal limits in parenchymal echogenicity. Portal vein is patent on color Doppler imaging with normal direction of blood flow towards the liver. IVC: No abnormality visualized. Pancreas: Visualized portion unremarkable. Spleen: Mildly enlarged, similar to CT. Right Kidney: Length: 14.3 cm. Echogenicity within normal limits. Mild hydronephrosis. No mass identified. Left Kidney: Length: 14.3. Echogenicity within normal limits. No mass or hydronephrosis visualized. Abdominal aorta: No aneurysm visualized although evaluation of the distal aorta and bifurcation is precluded by overlying bowel gas. Other findings: None. IMPRESSION: 1. Mild right hydronephrosis. 2. Mild splenomegaly. 3. No other acute finding in the abdomen. Electronically Signed   By: Wiliam Ke M.D.   On: 05/13/2022 17:23   VAS [place] LOWER EXTREMITY VENOUS (DVT)  Result Date: 05/13/2022  Lower Venous DVT Study Patient Name:  VERNEICE CASPERS  Date of Exam:   05/13/2022 Medical Rec #: [id number]           Accession #:    [id number] Date of Birth: 26-Apr-2001           Patient Gender: F Patient Age:   21 years Exam Location:  [facility] Procedure:      VAS [place] LOWER  EXTREMITY VENOUS (DVT) Referring Phys: PRANAV PATEL --------------------------------------------------------------------------------  Indications: Edema.  Risk Factors: Post partum. Limitations: Size and depth of vessels. Comparison Study: No prior studies. Performing Technologist: Jean Rosenthal RDMS, RVT  Examination Guidelines: A complete evaluation includes B-mode imaging, spectral Doppler,  color Doppler, and power Doppler as needed of all accessible portions of each vessel. Bilateral testing is considered an integral part of a complete examination. Limited examinations for reoccurring indications may be performed as noted. The reflux portion of the exam is performed with the patient in reverse Trendelenburg.  +---------+---------------+---------+-----------+----------+--------------+ RIGHT    CompressibilityPhasicitySpontaneityPropertiesThrombus Aging +---------+---------------+---------+-----------+----------+--------------+ CFV      Full           Yes      Yes                                 +---------+---------------+---------+-----------+----------+--------------+ SFJ      Full                                                        +---------+---------------+---------+-----------+----------+--------------+ FV Prox  Full                                                        +---------+---------------+---------+-----------+----------+--------------+ FV Mid   Full                                                        +---------+---------------+---------+-----------+----------+--------------+ FV DistalFull                                                        +---------+---------------+---------+-----------+----------+--------------+ PFV      Full                                                        +---------+---------------+---------+-----------+----------+--------------+ POP      Full           Yes      Yes                                 +---------+---------------+---------+-----------+----------+--------------+ PTV      Full                                                        +---------+---------------+---------+-----------+----------+--------------+ PERO     Full                                                        +---------+---------------+---------+-----------+----------+--------------+    +---------+---------------+---------+-----------+----------+--------------+  LEFT     CompressibilityPhasicitySpontaneityPropertiesThrombus Aging +---------+---------------+---------+-----------+----------+--------------+ CFV      Full           Yes      Yes                                 +---------+---------------+---------+-----------+----------+--------------+ SFJ      Full                                                        +---------+---------------+---------+-----------+----------+--------------+ FV Prox  Full                                                        +---------+---------------+---------+-----------+----------+--------------+ FV Mid   Full                                                        +---------+---------------+---------+-----------+----------+--------------+ FV DistalFull                                                        +---------+---------------+---------+-----------+----------+--------------+ PFV      Full                                                        +---------+---------------+---------+-----------+----------+--------------+ POP      Full           Yes      Yes                                 +---------+---------------+---------+-----------+----------+--------------+ PTV      Full                                                        +---------+---------------+---------+-----------+----------+--------------+ PERO     Full                                                        +---------+---------------+---------+-----------+----------+--------------+     Summary: RIGHT: - There is no evidence of deep vein thrombosis in the lower extremity.  - No cystic structure found in the popliteal fossa.  LEFT: - There is no evidence of deep vein thrombosis in the lower extremity.  - No cystic  structure found in the popliteal fossa.  *See table(s) above for measurements and observations. Electronically signed  by Waverly Ferrari MD on 05/13/2022 at 1:34:49 PM.    Final    CT RENAL STONE STUDY  Result Date: 05/11/2022 CLINICAL DATA:  Pyelonephritis seen on MRI. EXAM: CT ABDOMEN AND PELVIS WITHOUT CONTRAST TECHNIQUE: Multidetector CT imaging of the abdomen and pelvis was performed following the standard protocol without IV contrast. RADIATION DOSE REDUCTION: This exam was performed according to the departmental dose-optimization program which includes automated exposure control, adjustment of the mA and/or kV according to patient size and/or use of iterative reconstruction technique. COMPARISON:  MRI lumbar spine 05/11/2022 FINDINGS: Lower chest: There is atelectasis in the bilateral lower lobes. Hepatobiliary: The liver is mildly enlarged. No focal liver lesions are seen. Gallbladder and bile ducts are within normal limits. Pancreas: Unremarkable. No pancreatic ductal dilatation or surrounding inflammatory changes. Spleen: Mildly enlarged. Adrenals/Urinary Tract: There is subtle heterogeneity throughout the left renal parenchyma. There is mild left perinephric fat stranding and fluid. No urinary tract calculi. No definite hydronephrosis. The adrenal glands and bladder are within normal limits. Stomach/Bowel: Stomach is within normal limits. Appendix appears normal. No evidence of bowel wall thickening, distention, or inflammatory changes. Vascular/Lymphatic: No significant vascular findings are present. No enlarged abdominal or pelvic lymph nodes. Reproductive: Uterus and bilateral adnexa are unremarkable. Other: There is a small amount of free fluid in the pelvis. There is also small amount of fluid tracking along the left retroperitoneum. No free air or focal abdominal wall hernia. Musculoskeletal: No acute or significant osseous findings. IMPRESSION: 1. Findings suspicious for infection/inflammation involving the left kidney and left ureter. 2. Small amount of free fluid in the pelvis is likely physiologic. 3.  Mild hepatosplenomegaly. Electronically Signed   By: Darliss Cheney M.D.   On: 05/11/2022 19:26   ECHOCARDIOGRAM COMPLETE  Result Date: 05/11/2022    ECHOCARDIOGRAM REPORT   Patient Name:   Kathryn Pena Date of Exam: 05/11/2022 Medical Rec #:  [id number]          Height:       68.0 in Accession #:    [id number]         Weight:       237.0 lb Date of Birth:  02-05-2001          BSA:          2.197 m Patient Age:    21 years           BP:           88/48 mmHg Patient Gender: F                  HR:           129 bpm. Exam Location:  Inpatient Procedure: 2D Echo Indications:    dyspnea  History:        Patient has no prior history of Echocardiogram examinations.                 Risk Factors:postpartum.  Sonographer:    Delcie Roch RDCS Referring Phys: [phone] WHITNEY D HARRIS IMPRESSIONS  1. Left ventricular ejection fraction, by estimation, is 30 to 35%. The left ventricle has moderately decreased function. The left ventricle demonstrates global hypokinesis. The left ventricular internal cavity size was mildly dilated. Indeterminate diastolic filling due to E-A fusion.  2. Right ventricular systolic function is normal. The right ventricular size is normal.  3. Left atrial size was mildly  dilated.  4. The mitral valve is normal in structure. No evidence of mitral valve regurgitation. No evidence of mitral stenosis.  5. The aortic valve is normal in structure. Aortic valve regurgitation is not visualized. No aortic stenosis is present.  6. The inferior vena cava is normal in size with greater than 50% respiratory variability, suggesting right atrial pressure of 3 mmHg. FINDINGS  Left Ventricle: Left ventricular ejection fraction, by estimation, is 30 to 35%. The left ventricle has moderately decreased function. The left ventricle demonstrates global hypokinesis. The left ventricular internal cavity size was mildly dilated. There is no left ventricular hypertrophy. Indeterminate diastolic filling due to E-A  fusion. Right Ventricle: The right ventricular size is normal. No increase in right ventricular wall thickness. Right ventricular systolic function is normal. Left Atrium: Left atrial size was mildly dilated. Right Atrium: Right atrial size was normal in size. Pericardium: There is no evidence of pericardial effusion. Mitral Valve: The mitral valve is normal in structure. No evidence of mitral valve regurgitation. No evidence of mitral valve stenosis. Tricuspid Valve: The tricuspid valve is normal in structure. Tricuspid valve regurgitation is not demonstrated. No evidence of tricuspid stenosis. Aortic Valve: The aortic valve is normal in structure. Aortic valve regurgitation is not visualized. No aortic stenosis is present. Pulmonic Valve: The pulmonic valve was normal in structure. Pulmonic valve regurgitation is not visualized. No evidence of pulmonic stenosis. Aorta: The aortic root is normal in size and structure. Venous: The inferior vena cava is normal in size with greater than 50% respiratory variability, suggesting right atrial pressure of 3 mmHg. IAS/Shunts: No atrial level shunt detected by color flow Doppler.  LEFT VENTRICLE PLAX 2D LVIDd:         5.10 cm LVIDs:         4.30 cm LV PW:         0.90 cm LV IVS:        0.90 cm LVOT diam:     2.20 cm LV SV:         50 LV SV Index:   23 LVOT Area:     3.80 cm  LV Volumes (MOD) LV vol d, MOD A2C: 115.0 ml LV vol d, MOD A4C: 111.0 ml LV vol s, MOD A2C: 77.6 ml LV vol s, MOD A4C: 72.1 ml LV SV MOD A2C:     37.4 ml LV SV MOD A4C:     111.0 ml LV SV MOD BP:      37.9 ml RIGHT VENTRICLE RV Basal diam:  2.70 cm RV S prime:     13.70 cm/s TAPSE (M-mode): 2.1 cm LEFT ATRIUM             Index        RIGHT ATRIUM           Index LA diam:        3.70 cm 1.68 cm/m   RA Area:     13.50 cm LA Vol (A2C):   31.7 ml 14.43 ml/m  RA Volume:   32.90 ml  14.98 ml/m LA Vol (A4C):   36.6 ml 16.66 ml/m LA Biplane Vol: 37.1 ml 16.89 ml/m  AORTIC VALVE LVOT Vmax:   99.80 cm/s  LVOT Vmean:  67.100 cm/s LVOT VTI:    0.131 m  AORTA Ao Root diam: 2.80 cm Ao Asc diam:  2.80 cm TRICUSPID VALVE TR Peak grad:   27.9 mmHg TR Vmax:        264.00 cm/s  SHUNTS  Systemic VTI:  0.13 m Systemic Diam: 2.20 cm Thurmon Fair MD Electronically signed by Thurmon Fair MD Signature Date/Time: 05/11/2022/5:45:24 PM    Final    MR Lumbar Spine W Wo Contrast  Result Date: 05/11/2022 CLINICAL DATA:  Initial evaluation for neck and back pain, infection suspected. EXAM: MRI CERVICAL, THORACIC AND LUMBAR SPINE WITHOUT AND WITH CONTRAST TECHNIQUE: Multiplanar and multiecho pulse sequences of the cervical spine, to include the craniocervical junction and cervicothoracic junction, and thoracic and lumbar spine, were obtained without and with intravenous contrast. CONTRAST:  10mL GADAVIST GADOBUTROL 1 MMOL/ML IV SOLN COMPARISON:  None Available. FINDINGS: MRI CERVICAL SPINE FINDINGS Alignment: Straightening of the normal cervical lordosis. No listhesis. Vertebrae: Chronic height loss noted at the superior endplate of T3. Otherwise, vertebral body height maintained. Bone marrow signal intensity diffusely decreased on T1 weighted imaging, which could be within normal limits in this young patient. No discrete or worrisome osseous lesions. No evidence for osteomyelitis discitis or septic arthritis. Cord: Normal signal and morphology. No abnormal enhancement. No epidural collections. Posterior Fossa, vertebral arteries, paraspinal tissues: Mild cerebellar tonsillar ectopia of up to approximately 4 mm at the foramen magnum without frank Chiari malformation. Visualized brain and posterior fossa otherwise unremarkable. Craniocervical junction otherwise unremarkable. Paraspinous soft tissues within normal limits. Normal flow voids seen within the vertebral arteries bilaterally. Disc levels: C2-C3: Unremarkable. C3-C4:  Unremarkable. C4-C5: Mild disc bulge. No significant canal or foraminal stenosis. C5-C6: Mild disc bulge.  No significant canal or foraminal stenosis. C6-C7:  Unremarkable. C7-T1:  Unremarkable. MRI THORACIC SPINE FINDINGS Alignment: Straightening of the normal midthoracic kyphosis. No listhesis. Vertebrae: Mild chronic compression deformities noted at the superior endplates of T3 and T4 without retropulsion. Vertebral body height otherwise maintained. Underlying bone marrow signal intensity somewhat diffusely decreased on T1 weighted imaging, which may be normal in this young patient. No discrete or worrisome osseous lesions. No evidence for osteomyelitis discitis or septic arthritis. Cord: Normal signal and morphology. No abnormal enhancement. No epidural abscess or other collection. Paraspinal and other soft tissues: Paraspinous soft tissues within normal limits. Trace layering bilateral pleural effusions noted. Asymmetric enlargement of the left kidney noted, partially visualized, better characterized on corresponding lumbar spine portion of this exam. Disc levels: No significant disc pathology seen within the thoracic spine. No disc bulge or focal disc herniation. No stenosis or impingement. MRI LUMBAR SPINE FINDINGS Segmentation: Standard. Lowest well-formed disc space labeled the L5-S1 level. Alignment: Physiologic with preservation of the normal lumbar lordosis. No listhesis. Vertebrae: Vertebral body height maintained without acute or chronic fracture. Bone marrow signal intensity somewhat diffusely decreased on T1 weighted imaging, which could be within normal limits in this young patient. No discrete or worrisome osseous lesions. No evidence for osteomyelitis discitis or septic arthritis. Conus medullaris and cauda equina: Conus extends to the L1-2 level. Conus and cauda equina appear normal. No abnormal enhancement. No epidural collections. Paraspinal and other soft tissues: Paraspinous soft tissues within normal limits. There is asymmetric enlargement with somewhat striated appearance of the left kidney,  with associated perinephric fat stranding. Finding raises the possibility for acute pyelonephritis. Mildly prominent retroperitoneal lymph nodes measure up to 9 mm, which could be reactive. Disc levels: No significant disc pathology seen within the lumbar spine. Intervertebral discs are well hydrated with preserved disc height. No disc bulge or focal disc herniation. No significant facet disease. No canal or neural foraminal stenosis or evidence for neural impingement. IMPRESSION: 1. No MRI evidence for acute infection within the cervical,  thoracic, or lumbar spine. 2. Asymmetric enlargement with somewhat striated appearance of the left kidney with associated perinephric fat stranding. Finding raises the possibility for acute pyelonephritis. Correlation with laboratory values and urinalysis recommended. 3. Mild noncompressive disc bulging at C4-5 and C5-6 without stenosis. 4. Mild chronic compression deformities at the superior endplates of T3 and T4. No retropulsion or stenosis. Electronically Signed   By: Rise MuBenjamin  McClintock M.D.   On: 05/11/2022 02:42   MR THORACIC SPINE W WO CONTRAST  Result Date: 05/11/2022 CLINICAL DATA:  Initial evaluation for neck and back pain, infection suspected. EXAM: MRI CERVICAL, THORACIC AND LUMBAR SPINE WITHOUT AND WITH CONTRAST TECHNIQUE: Multiplanar and multiecho pulse sequences of the cervical spine, to include the craniocervical junction and cervicothoracic junction, and thoracic and lumbar spine, were obtained without and with intravenous contrast. CONTRAST:  10mL GADAVIST GADOBUTROL 1 MMOL/ML IV SOLN COMPARISON:  None Available. FINDINGS: MRI CERVICAL SPINE FINDINGS Alignment: Straightening of the normal cervical lordosis. No listhesis. Vertebrae: Chronic height loss noted at the superior endplate of T3. Otherwise, vertebral body height maintained. Bone marrow signal intensity diffusely decreased on T1 weighted imaging, which could be within normal limits in this young  patient. No discrete or worrisome osseous lesions. No evidence for osteomyelitis discitis or septic arthritis. Cord: Normal signal and morphology. No abnormal enhancement. No epidural collections. Posterior Fossa, vertebral arteries, paraspinal tissues: Mild cerebellar tonsillar ectopia of up to approximately 4 mm at the foramen magnum without frank Chiari malformation. Visualized brain and posterior fossa otherwise unremarkable. Craniocervical junction otherwise unremarkable. Paraspinous soft tissues within normal limits. Normal flow voids seen within the vertebral arteries bilaterally. Disc levels: C2-C3: Unremarkable. C3-C4:  Unremarkable. C4-C5: Mild disc bulge. No significant canal or foraminal stenosis. C5-C6: Mild disc bulge. No significant canal or foraminal stenosis. C6-C7:  Unremarkable. C7-T1:  Unremarkable. MRI THORACIC SPINE FINDINGS Alignment: Straightening of the normal midthoracic kyphosis. No listhesis. Vertebrae: Mild chronic compression deformities noted at the superior endplates of T3 and T4 without retropulsion. Vertebral body height otherwise maintained. Underlying bone marrow signal intensity somewhat diffusely decreased on T1 weighted imaging, which may be normal in this young patient. No discrete or worrisome osseous lesions. No evidence for osteomyelitis discitis or septic arthritis. Cord: Normal signal and morphology. No abnormal enhancement. No epidural abscess or other collection. Paraspinal and other soft tissues: Paraspinous soft tissues within normal limits. Trace layering bilateral pleural effusions noted. Asymmetric enlargement of the left kidney noted, partially visualized, better characterized on corresponding lumbar spine portion of this exam. Disc levels: No significant disc pathology seen within the thoracic spine. No disc bulge or focal disc herniation. No stenosis or impingement. MRI LUMBAR SPINE FINDINGS Segmentation: Standard. Lowest well-formed disc space labeled the L5-S1  level. Alignment: Physiologic with preservation of the normal lumbar lordosis. No listhesis. Vertebrae: Vertebral body height maintained without acute or chronic fracture. Bone marrow signal intensity somewhat diffusely decreased on T1 weighted imaging, which could be within normal limits in this young patient. No discrete or worrisome osseous lesions. No evidence for osteomyelitis discitis or septic arthritis. Conus medullaris and cauda equina: Conus extends to the L1-2 level. Conus and cauda equina appear normal. No abnormal enhancement. No epidural collections. Paraspinal and other soft tissues: Paraspinous soft tissues within normal limits. There is asymmetric enlargement with somewhat striated appearance of the left kidney, with associated perinephric fat stranding. Finding raises the possibility for acute pyelonephritis. Mildly prominent retroperitoneal lymph nodes measure up to 9 mm, which could be reactive. Disc levels: No significant  disc pathology seen within the lumbar spine. Intervertebral discs are well hydrated with preserved disc height. No disc bulge or focal disc herniation. No significant facet disease. No canal or neural foraminal stenosis or evidence for neural impingement. IMPRESSION: 1. No MRI evidence for acute infection within the cervical, thoracic, or lumbar spine. 2. Asymmetric enlargement with somewhat striated appearance of the left kidney with associated perinephric fat stranding. Finding raises the possibility for acute pyelonephritis. Correlation with laboratory values and urinalysis recommended. 3. Mild noncompressive disc bulging at C4-5 and C5-6 without stenosis. 4. Mild chronic compression deformities at the superior endplates of T3 and T4. No retropulsion or stenosis. Electronically Signed   By: Rise Mu M.D.   On: 05/11/2022 02:42   MR Cervical Spine W or Wo Contrast  Result Date: 05/11/2022 CLINICAL DATA:  Initial evaluation for neck and back pain, infection  suspected. EXAM: MRI CERVICAL, THORACIC AND LUMBAR SPINE WITHOUT AND WITH CONTRAST TECHNIQUE: Multiplanar and multiecho pulse sequences of the cervical spine, to include the craniocervical junction and cervicothoracic junction, and thoracic and lumbar spine, were obtained without and with intravenous contrast. CONTRAST:  10mL GADAVIST GADOBUTROL 1 MMOL/ML IV SOLN COMPARISON:  None Available. FINDINGS: MRI CERVICAL SPINE FINDINGS Alignment: Straightening of the normal cervical lordosis. No listhesis. Vertebrae: Chronic height loss noted at the superior endplate of T3. Otherwise, vertebral body height maintained. Bone marrow signal intensity diffusely decreased on T1 weighted imaging, which could be within normal limits in this young patient. No discrete or worrisome osseous lesions. No evidence for osteomyelitis discitis or septic arthritis. Cord: Normal signal and morphology. No abnormal enhancement. No epidural collections. Posterior Fossa, vertebral arteries, paraspinal tissues: Mild cerebellar tonsillar ectopia of up to approximately 4 mm at the foramen magnum without frank Chiari malformation. Visualized brain and posterior fossa otherwise unremarkable. Craniocervical junction otherwise unremarkable. Paraspinous soft tissues within normal limits. Normal flow voids seen within the vertebral arteries bilaterally. Disc levels: C2-C3: Unremarkable. C3-C4:  Unremarkable. C4-C5: Mild disc bulge. No significant canal or foraminal stenosis. C5-C6: Mild disc bulge. No significant canal or foraminal stenosis. C6-C7:  Unremarkable. C7-T1:  Unremarkable. MRI THORACIC SPINE FINDINGS Alignment: Straightening of the normal midthoracic kyphosis. No listhesis. Vertebrae: Mild chronic compression deformities noted at the superior endplates of T3 and T4 without retropulsion. Vertebral body height otherwise maintained. Underlying bone marrow signal intensity somewhat diffusely decreased on T1 weighted imaging, which may be normal  in this young patient. No discrete or worrisome osseous lesions. No evidence for osteomyelitis discitis or septic arthritis. Cord: Normal signal and morphology. No abnormal enhancement. No epidural abscess or other collection. Paraspinal and other soft tissues: Paraspinous soft tissues within normal limits. Trace layering bilateral pleural effusions noted. Asymmetric enlargement of the left kidney noted, partially visualized, better characterized on corresponding lumbar spine portion of this exam. Disc levels: No significant disc pathology seen within the thoracic spine. No disc bulge or focal disc herniation. No stenosis or impingement. MRI LUMBAR SPINE FINDINGS Segmentation: Standard. Lowest well-formed disc space labeled the L5-S1 level. Alignment: Physiologic with preservation of the normal lumbar lordosis. No listhesis. Vertebrae: Vertebral body height maintained without acute or chronic fracture. Bone marrow signal intensity somewhat diffusely decreased on T1 weighted imaging, which could be within normal limits in this young patient. No discrete or worrisome osseous lesions. No evidence for osteomyelitis discitis or septic arthritis. Conus medullaris and cauda equina: Conus extends to the L1-2 level. Conus and cauda equina appear normal. No abnormal enhancement. No epidural collections. Paraspinal and other  soft tissues: Paraspinous soft tissues within normal limits. There is asymmetric enlargement with somewhat striated appearance of the left kidney, with associated perinephric fat stranding. Finding raises the possibility for acute pyelonephritis. Mildly prominent retroperitoneal lymph nodes measure up to 9 mm, which could be reactive. Disc levels: No significant disc pathology seen within the lumbar spine. Intervertebral discs are well hydrated with preserved disc height. No disc bulge or focal disc herniation. No significant facet disease. No canal or neural foraminal stenosis or evidence for neural  impingement. IMPRESSION: 1. No MRI evidence for acute infection within the cervical, thoracic, or lumbar spine. 2. Asymmetric enlargement with somewhat striated appearance of the left kidney with associated perinephric fat stranding. Finding raises the possibility for acute pyelonephritis. Correlation with laboratory values and urinalysis recommended. 3. Mild noncompressive disc bulging at C4-5 and C5-6 without stenosis. 4. Mild chronic compression deformities at the superior endplates of T3 and T4. No retropulsion or stenosis. Electronically Signed   By: Rise Mu M.D.   On: 05/11/2022 02:42   DG Chest Port 1 View  Result Date: 05/10/2022 CLINICAL DATA:  Questionable sepsis.  Chronic back pain. EXAM: PORTABLE CHEST 1 VIEW COMPARISON:  Chest radiograph 10/13/2017. FINDINGS: Of note, patient is mildly rotated on this portable exam. Mild enlargement of the cardiac silhouette, likely secondary to portable technique. No focal consolidation, pleural effusion, or pneumothorax. The visualized skeletal structures are unremarkable. IMPRESSION: No acute cardiopulmonary abnormality. Electronically Signed   By: Sherron Ales M.D.   On: 05/10/2022 20:38    Microbiology: Results for orders placed or performed during the hospital encounter of 05/10/22  Blood Culture (routine x 2)     Status: None   Collection Time: 05/10/22  8:37 PM   Specimen: BLOOD  Result Value Ref Range Status   Specimen Description BLOOD LEFT  Final   Special Requests   Final    BOTTLES DRAWN AEROBIC AND ANAEROBIC Blood Culture adequate volume   Culture  Setup Time   Final    GRAM NEGATIVE RODS IN BOTH AEROBIC AND ANAEROBIC BOTTLES    Culture   Final    GRAM NEGATIVE RODS SUSCEPTIBILITIES PERFORMED ON PREVIOUS CULTURE WITHIN THE LAST 5 DAYS. Performed at [facility], [address], [place], [place] [address]    Report Status 05/13/2022 FINAL  Final  Blood Culture (routine x 2)     Status: Abnormal   Collection Time:  05/10/22  8:37 PM   Specimen: BLOOD  Result Value Ref Range Status   Specimen Description BLOOD RIGHT  Final   Special Requests   Final    BOTTLES DRAWN AEROBIC AND ANAEROBIC Blood Culture adequate volume   Culture  Setup Time   Final    GRAM NEGATIVE RODS IN BOTH AEROBIC AND ANAEROBIC BOTTLES Organism ID to follow CRITICAL RESULT CALLED TO, READ BACK BY AND VERIFIED WITH: J. MILLEN PHARMD, AT 1038 05/11/22 D. VANHOOK Performed at [facility], [address], [place], [place] [address]    Culture ESCHERICHIA COLI (A)  Final   Report Status 05/13/2022 FINAL  Final   Organism ID, Bacteria ESCHERICHIA COLI  Final      Susceptibility   Escherichia coli - MIC*    AMPICILLIN <=2 SENSITIVE Sensitive     CEFAZOLIN <=4 SENSITIVE Sensitive     CEFEPIME <=0.12 SENSITIVE Sensitive     CEFTAZIDIME <=1 SENSITIVE Sensitive     CEFTRIAXONE <=0.25 SENSITIVE Sensitive     CIPROFLOXACIN <=0.25 SENSITIVE Sensitive  GENTAMICIN <=1 SENSITIVE Sensitive     IMIPENEM <=0.25 SENSITIVE Sensitive     TRIMETH/SULFA <=20 SENSITIVE Sensitive     AMPICILLIN/SULBACTAM <=2 SENSITIVE Sensitive     PIP/TAZO <=4 SENSITIVE Sensitive     * ESCHERICHIA COLI  Blood Culture ID Panel (Reflexed)     Status: Abnormal   Collection Time: 05/10/22  8:37 PM  Result Value Ref Range Status   Enterococcus faecalis NOT DETECTED NOT DETECTED Final   Enterococcus Faecium NOT DETECTED NOT DETECTED Final   Listeria monocytogenes NOT DETECTED NOT DETECTED Final   Staphylococcus species NOT DETECTED NOT DETECTED Final   Staphylococcus aureus (BCID) NOT DETECTED NOT DETECTED Final   Staphylococcus epidermidis NOT DETECTED NOT DETECTED Final   Staphylococcus lugdunensis NOT DETECTED NOT DETECTED Final   Streptococcus species NOT DETECTED NOT DETECTED Final   Streptococcus agalactiae NOT DETECTED NOT DETECTED Final   Streptococcus pneumoniae NOT DETECTED NOT DETECTED Final   Streptococcus pyogenes NOT DETECTED NOT DETECTED  Final   A.calcoaceticus-baumannii NOT DETECTED NOT DETECTED Final   Bacteroides fragilis NOT DETECTED NOT DETECTED Final   Enterobacterales DETECTED (A) NOT DETECTED Final    Comment: Enterobacterales represent a large order of gram negative bacteria, not a single organism. CRITICAL RESULT CALLED TO, READ BACK BY AND VERIFIED WITH: J. MILLEN PHARMD, AT 1038 05/11/22 D. VANHOOK    Enterobacter cloacae complex NOT DETECTED NOT DETECTED Final   Escherichia coli DETECTED (A) NOT DETECTED Final    Comment: CRITICAL RESULT CALLED TO, READ BACK BY AND VERIFIED WITH: J. MILLEN PHARMD, AT 1038 05/11/22 D. VANHOOK    Klebsiella aerogenes NOT DETECTED NOT DETECTED Final   Klebsiella oxytoca NOT DETECTED NOT DETECTED Final   Klebsiella pneumoniae NOT DETECTED NOT DETECTED Final   Proteus species NOT DETECTED NOT DETECTED Final   Salmonella species NOT DETECTED NOT DETECTED Final   Serratia marcescens NOT DETECTED NOT DETECTED Final   Haemophilus influenzae NOT DETECTED NOT DETECTED Final   Neisseria meningitidis NOT DETECTED NOT DETECTED Final   Pseudomonas aeruginosa NOT DETECTED NOT DETECTED Final   Stenotrophomonas maltophilia NOT DETECTED NOT DETECTED Final   Candida albicans NOT DETECTED NOT DETECTED Final   Candida auris NOT DETECTED NOT DETECTED Final   Candida glabrata NOT DETECTED NOT DETECTED Final   Candida krusei NOT DETECTED NOT DETECTED Final   Candida parapsilosis NOT DETECTED NOT DETECTED Final   Candida tropicalis NOT DETECTED NOT DETECTED Final   Cryptococcus neoformans/gattii NOT DETECTED NOT DETECTED Final   CTX-M ESBL NOT DETECTED NOT DETECTED Final   Carbapenem resistance IMP NOT DETECTED NOT DETECTED Final   Carbapenem resistance KPC NOT DETECTED NOT DETECTED Final   Carbapenem resistance NDM NOT DETECTED NOT DETECTED Final   Carbapenem resist OXA 48 LIKE NOT DETECTED NOT DETECTED Final   Carbapenem resistance VIM NOT DETECTED NOT DETECTED Final    Comment: Performed at  [facility], [address] [place], [place] [address]  Urine Culture     Status: Abnormal   Collection Time: 05/10/22 11:56 PM   Specimen: In/Out Cath Urine  Result Value Ref Range Status   Specimen Description IN/OUT CATH URINE  Final   Special Requests   Final    NONE Performed at [facility], [address], [place], [place] [address]    Culture >=100,000 COLONIES/mL ESCHERICHIA COLI (A)  Final   Report Status 05/13/2022 FINAL  Final   Organism ID, Bacteria ESCHERICHIA COLI (A)  Final  Susceptibility   Escherichia coli - MIC*    AMPICILLIN <=2 SENSITIVE Sensitive     CEFAZOLIN <=4 SENSITIVE Sensitive     CEFEPIME <=0.12 SENSITIVE Sensitive     CEFTRIAXONE <=0.25 SENSITIVE Sensitive     CIPROFLOXACIN <=0.25 SENSITIVE Sensitive     GENTAMICIN <=1 SENSITIVE Sensitive     IMIPENEM <=0.25 SENSITIVE Sensitive     NITROFURANTOIN <=16 SENSITIVE Sensitive     TRIMETH/SULFA <=20 SENSITIVE Sensitive     AMPICILLIN/SULBACTAM <=2 SENSITIVE Sensitive     PIP/TAZO <=4 SENSITIVE Sensitive     * >=100,000 COLONIES/mL ESCHERICHIA COLI  MRSA Next Gen by PCR, Nasal     Status: None   Collection Time: 05/11/22  8:19 PM   Specimen: Nasal Mucosa; Nasal Swab  Result Value Ref Range Status   MRSA by PCR Next Gen NOT DETECTED NOT DETECTED Final    Comment: (NOTE) The GeneXpert MRSA Assay (FDA approved for NASAL specimens only), is one component of a comprehensive MRSA colonization surveillance program. It is not intended to diagnose MRSA infection nor to guide or monitor treatment for MRSA infections. Test performance is not FDA approved in patients less than 31 years old. Performed at [facility], [address], [place], [place] [address]   Culture, blood (Routine X 2) w Reflex to ID Panel     Status: None   Collection Time: 05/12/22  6:13 AM   Specimen: BLOOD  Result Value Ref Range Status   Specimen Description BLOOD LEFT ANTECUBITAL  Final   Special Requests    Final    BOTTLES DRAWN AEROBIC AND ANAEROBIC Blood Culture adequate volume   Culture   Final    NO GROWTH 5 DAYS Performed at [facility], [address], [place], [place] [address]    Report Status 05/17/2022 FINAL  Final  Culture, blood (Routine X 2) w Reflex to ID Panel     Status: None   Collection Time: 05/12/22  6:18 AM   Specimen: BLOOD LEFT FOREARM  Result Value Ref Range Status   Specimen Description BLOOD LEFT FOREARM  Final   Special Requests   Final    BOTTLES DRAWN AEROBIC AND ANAEROBIC Blood Culture adequate volume   Culture   Final    NO GROWTH 5 DAYS Performed at [facility], [address] [place], [place] [address]    Report Status 05/17/2022 FINAL  Final    Labs: CBC: Recent Labs  Lab 05/13/22 0103 05/14/22 0603 05/15/22 4287 05/16/22 0029 05/17/22 0342 05/18/22 0835  WBC 13.1* 8.9 8.8 10.4 11.5*  --   HGB 10.3* 9.3* 8.8* 8.3* 7.7* 8.2*  HCT 31.3* 28.3* 26.5* 24.9* 22.8* 25.3*  MCV 83.0 82.5 83.1 82.2 82.0  --   PLT 174 168 184 219 333  --    Basic Metabolic Panel: Recent Labs  Lab 05/11/22 1500 05/12/22 0613 05/13/22 0103 05/14/22 0603 05/15/22 0733 05/16/22 0029 05/17/22 0342  NA 136 139 136 136 137 136 136  K 3.7 4.0 3.5 3.4* 3.6 3.6 3.7  CL 104 108 105 106 106 106 106  CO2 21* 22 23 23 23  20* 22  GLUCOSE 112* 110* 100* 95 100* 128* 110*  BUN 19 24* 17 11 10 9  5*  CREATININE 2.18* 2.44* 1.94* 1.61* 1.45* 1.18* 1.09*  CALCIUM 8.0* 8.0* 8.3* 8.2* 8.3* 8.3* 8.2*  MG 1.8 1.7 2.1 2.0  --   --   --    Liver Function Tests: No  results for input(s): "AST", "ALT", "ALKPHOS", "BILITOT", "PROT", "ALBUMIN" in the last 168 hours. CBG: Recent Labs  Lab 05/11/22 1621  GLUCAP 100*    Discharge time spent: greater than 30 minutes.  Signed: Coralie Keens, MD [facility] 05/18/2022

## 2022-05-21 ENCOUNTER — Ambulatory Visit: Payer: Medicaid Other | Admitting: Obstetrics and Gynecology

## 2022-05-30 ENCOUNTER — Ambulatory Visit (INDEPENDENT_AMBULATORY_CARE_PROVIDER_SITE_OTHER): Payer: Medicaid Other | Admitting: Cardiology

## 2022-05-30 ENCOUNTER — Encounter: Payer: Self-pay | Admitting: Cardiology

## 2022-05-30 VITALS — BP 122/96 | HR 89 | Ht 67.0 in | Wt 213.3 lb

## 2022-05-30 DIAGNOSIS — O903 Peripartum cardiomyopathy: Secondary | ICD-10-CM | POA: Diagnosis not present

## 2022-05-30 DIAGNOSIS — O165 Unspecified maternal hypertension, complicating the puerperium: Secondary | ICD-10-CM | POA: Diagnosis not present

## 2022-05-30 DIAGNOSIS — E669 Obesity, unspecified: Secondary | ICD-10-CM | POA: Diagnosis not present

## 2022-05-30 NOTE — Progress Notes (Signed)
Cardio-Obstetrics Clinic  New Evaluation  Date:  05/30/2022   ID:  Kathryn Pena, DOB 09-24-2001, MRN [id number]  PCP:  Hermina Staggers, MD   [facility] Providers Cardiologist:  Thomasene Ripple, DO  Electrophysiologist:  None       Referring MD: No ref. provider found   Chief Complaint: "I am doing ok"  History of Present Illness:    Kathryn Pena is a 21 y.o. female [G3P2012] who is being seen today for the evaluation of  at the request of No ref. provider found.   Medical history includes gestational hypertension, with postpartum hypertension and postpartum cardiomyopathy initial EF was 30 to 35% with recovery on May 16, 2022 with EF now 50 to 55%.  Currently is on Lopressor 12.5 mg twice daily.  She tells me she is doing wellShe was admitted to the [facility] from July 20 9-6 where she was treated for pyelonephritis With sepsis.  During her hospitalization she was transiently in the ICU.  Her echo initially showed depressed ejection fraction 30 to 35% and she was treated for heart failure for postpartum cardiomyopathy.  On June 11, 2022 showed EF of 55 to 60%.  Prior CV Studies Reviewed: The following studies were reviewed today:  Tte 05/16/2022  IMPRESSIONS   1. Left ventricular ejection fraction, by estimation, is 55 to 60%. The left ventricle has normal function. The left ventricle has no regional wall motion abnormalities. Left ventricular diastolic parameters were normal.   2. Right ventricular systolic function is normal. The right ventricular size is normal. There is normal pulmonary artery systolic pressure. The estimated right ventricular systolic pressure is 23.8 mmHg.   3. The mitral valve is normal in structure. Trivial mitral valve regurgitation. No evidence of mitral stenosis. No mitral valve vegetation.   4. The aortic valve is tricuspid. Aortic valve regurgitation is not visualized. No aortic stenosis is present. No aortic valve vegetation.    5. No tricuspid valve vegetation. Tricuspid valve regurgitation is trivial.   6. No pulmonary valve vegetation.   7. The inferior vena cava is normal in size with greater than 50% respiratory variability, suggesting right atrial pressure of 3 mmHg.   FINDINGS   Left Ventricle: Left ventricular ejection fraction, by estimation, is 55 to 60%. The left ventricle has normal function. The left ventricle has no regional wall motion abnormalities. The left ventricular internal cavity  size was normal in size. There is  no left ventricular hypertrophy. Left ventricular diastolic parameters  were normal.   Right Ventricle: The right ventricular size is normal. No increase in right ventricular wall thickness. Right ventricular systolic function is normal. There is normal pulmonary artery systolic pressure. The tricuspid  regurgitant velocity is 2.28 m/s, and  with an assumed right atrial pressure of 3 mmHg, the estimated right  ventricular systolic pressure is 23.8 mmHg.   Pericardium: Trivial pericardial effusion is present.   Mitral Valve: The mitral valve is normal in structure. Trivial mitral valve regurgitation. No evidence of mitral valve stenosis.   Tricuspid Valve: No tricuspid valve vegetation. The tricuspid valve is normal in structure. Tricuspid valve regurgitation is trivial.   Aortic Valve: The aortic valve is tricuspid. Aortic valve regurgitation is not visualized. No aortic stenosis is present.   Pulmonic Valve: No pulmonary valve vegetation. The pulmonic valve was  normal in structure. Pulmonic valve regurgitation is trivial.   Venous: The inferior vena cava is normal in size with greater than 50%  respiratory variability,  suggesting right atrial pressure of 3 mmHg.  Past Medical History:  Diagnosis Date   Preeclampsia 2020   Pregnancy induced hypertension     Past Surgical History:  Procedure Laterality Date   NO PAST SURGERIES        OB History     Gravida  3    Para  2   Term  2   Preterm      AB  1   Living  2      SAB      IAB  1   Ectopic      Multiple  0   Live Births  2               Current Medications: Current Meds  Medication Sig   acetaminophen (TYLENOL) 500 MG tablet Take 500 mg by mouth every 6 (six) hours as needed for moderate pain.   ferrous sulfate 325 (65 FE) MG tablet Take 1 tablet (325 mg total) by mouth daily with breakfast.   ibuprofen (ADVIL) 200 MG tablet Take 200 mg by mouth every 6 (six) hours as needed for moderate pain.   metoprolol tartrate (LOPRESSOR) 25 MG tablet Take 0.5 tablets (12.5 mg total) by mouth 2 (two) times daily.   prenatal vitamin w/FE, FA (NATACHEW) 29-1 MG CHEW chewable tablet Chew 1 tablet by mouth daily at 12 noon.     Allergies:   Dilaudid [hydromorphone hcl]   Social History   Socioeconomic History   Marital status: Single    Spouse name: Not on file   Number of children: Not on file   Years of education: Not on file   Highest education level: Not on file  Occupational History   Not on file  Tobacco Use   Smoking status: Every Day   Smokeless tobacco: Never   Tobacco comments:    Black N Milds   Vaping Use   Vaping Use: Never used  Substance and Sexual Activity   Alcohol use: No   Drug use: No   Sexual activity: Yes  Other Topics Concern   Not on file  Social History Narrative   Not on file   Social Determinants of Health   Financial Resource Strain: Low Risk  (02/03/2019)   Overall Financial Resource Strain (CARDIA)    Difficulty of Paying Living Expenses: Not hard at all  Food Insecurity: No Food Insecurity (04/10/2022)   Hunger Vital Sign    Worried About Running Out of Food in the Last Year: Never true    Ran Out of Food in the Last Year: Never true  Transportation Needs: No Transportation Needs (04/10/2022)   PRAPARE - Administrator, Civil Service (Medical): No    Lack of Transportation (Non-Medical): No  Physical Activity: Not on  file  Stress: No Stress Concern Present (02/03/2019)   [employer] of Occupational Health - Occupational Stress Questionnaire    Feeling of Stress : Only a little  Social Connections: Not on file      Family History  Problem Relation Age of Onset   Healthy Mother    Hypertension Maternal Aunt    Diabetes Neg Hx    Heart disease Neg Hx    Stroke Neg Hx       ROS:   Please see the history of present illness.     All other systems reviewed and are negative.   Labs/EKG Reviewed:    EKG:   EKG is was not ordered today.  Recent Labs: 05/11/2022: ALT 17; TSH 2.420 05/12/2022: B Natriuretic Peptide 451.6 05/14/2022: Magnesium 2.0 05/17/2022: BUN 5; Creatinine, Ser 1.09; Platelets 333; Potassium 3.7; Sodium 136 05/18/2022: Hemoglobin 8.2   Recent Lipid Panel No results found for: "CHOL", "TRIG", "HDL", "CHOLHDL", "LDLCALC", "LDLDIRECT"  Physical Exam:    VS:  BP (!) 122/96   Pulse 89   Ht 5\' 7"  (1.702 m)   Wt 213 lb 4.8 oz (96.8 kg)   LMP 05/13/2022 (Approximate)   SpO2 99%   Breastfeeding No   BMI 33.41 kg/m     Wt Readings from Last 3 Encounters:  05/30/22 213 lb 4.8 oz (96.8 kg)  05/16/22 222 lb 10.6 oz (101 kg)  04/21/22 237 lb (107.5 kg)     GEN:  Well nourished, well developed in no acute distress HEENT: Normal NECK: No JVD; No carotid bruits LYMPHATICS: No lymphadenopathy CARDIAC: RRR, no murmurs, rubs, gallops RESPIRATORY:  Clear to auscultation without rales, wheezing or rhonchi  ABDOMEN: Soft, non-tender, non-distended MUSCULOSKELETAL:  No edema; No deformity  SKIN: Warm and dry NEUROLOGIC:  Alert and oriented x 3 PSYCHIATRIC:  Normal affect    Risk Assessment/Risk Calculators:                 ASSESSMENT & PLAN:    Postpartum Cardiomyopathy Postpartum hypertension  Her blood pressure in the office is within target.  I have asked the patient to take her blood pressure daily continue to monitor.  She will continue on her Lopressor. The  patient understands the need to lose weight with diet and exercise. We have discussed specific strategies for this.  I will see the patient back in 12 weeks.   Patient Instructions  Medication Instructions:  Your physician recommends that you continue on your current medications as directed. Please refer to the Current Medication list given to you today.   Please take your blood pressure daily and send in a MyChart message on 8/28. Please include heart rates.   HOW TO TAKE YOUR BLOOD PRESSURE: Rest 5 minutes before taking your blood pressure. Don't smoke or drink caffeinated beverages for at least 30 minutes before. Take your blood pressure before (not after) you eat. Sit comfortably with your back supported and both feet on the floor (don't cross your legs). Elevate your arm to heart level on a table or a desk. Use the proper sized cuff. It should fit smoothly and snugly around your bare upper arm. There should be enough room to slip a fingertip under the cuff. The bottom edge of the cuff should be 1 inch above the crease of the elbow. Ideally, take 3 measurements at one sitting and record the average.  *If you need a refill on your cardiac medications before your next appointment, please call your pharmacy*   Lab Work: None If you have labs (blood work) drawn today and your tests are completely normal, you will receive your results only by: MyChart Message (if you have MyChart) OR A paper copy in the mail If you have any lab test that is abnormal or we need to change your treatment, we will call you to review the results.   Testing/Procedures: None   Follow-Up: At [facility], you and your health needs are our priority.  As part of our continuing mission to provide you with exceptional heart care, we have created designated Provider Care Teams.  These Care Teams include your primary Cardiologist (physician) and Advanced Practice Providers (APPs -  Physician Assistants and  Nurse  Practitioners) who all work together to provide you with the care you need, when you need it.  We recommend signing up for the patient portal called "MyChart".  Sign up information is provided on this After Visit Summary.  MyChart is used to connect with patients for Virtual Visits (Telemedicine).  Patients are able to view lab/test results, encounter notes, upcoming appointments, etc.  Non-urgent messages can be sent to your provider as well.   To learn more about what you can do with MyChart, go to [email or web address].    Your next appointment:   12 week(s)  The format for your next appointment:   In Person  Provider:   Thomasene Ripple  [facility] [address], [place], [place] [address]    Other Instructions   Important Information About Sugar         Dispo:  Return in about 12 weeks (around 08/22/2022).   Medication Adjustments/Labs and Tests Ordered: Current medicines are reviewed at length with the patient today.  Concerns regarding medicines are outlined above.  Tests Ordered: No orders of the defined types were placed in this encounter.  Medication Changes: No orders of the defined types were placed in this encounter.

## 2022-05-30 NOTE — Patient Instructions (Addendum)
Medication Instructions:  Your physician recommends that you continue on your current medications as directed. Please refer to the Current Medication list given to you today.   Please take your blood pressure daily and send in a MyChart message on 8/28. Please include heart rates.   HOW TO TAKE YOUR BLOOD PRESSURE: Rest 5 minutes before taking your blood pressure. Don't smoke or drink caffeinated beverages for at least 30 minutes before. Take your blood pressure before (not after) you eat. Sit comfortably with your back supported and both feet on the floor (don't cross your legs). Elevate your arm to heart level on a table or a desk. Use the proper sized cuff. It should fit smoothly and snugly around your bare upper arm. There should be enough room to slip a fingertip under the cuff. The bottom edge of the cuff should be 1 inch above the crease of the elbow. Ideally, take 3 measurements at one sitting and record the average.  *If you need a refill on your cardiac medications before your next appointment, please call your pharmacy*   Lab Work: None If you have labs (blood work) drawn today and your tests are completely normal, you will receive your results only by: MyChart Message (if you have MyChart) OR A paper copy in the mail If you have any lab test that is abnormal or we need to change your treatment, we will call you to review the results.   Testing/Procedures: None   Follow-Up: At [facility], you and your health needs are our priority.  As part of our continuing mission to provide you with exceptional heart care, we have created designated Provider Care Teams.  These Care Teams include your primary Cardiologist (physician) and Advanced Practice Providers (APPs -  Physician Assistants and Nurse Practitioners) who all work together to provide you with the care you need, when you need it.  We recommend signing up for the patient portal called "MyChart".  Sign up information is  provided on this After Visit Summary.  MyChart is used to connect with patients for Virtual Visits (Telemedicine).  Patients are able to view lab/test results, encounter notes, upcoming appointments, etc.  Non-urgent messages can be sent to your provider as well.   To learn more about what you can do with MyChart, go to [email or web address].    Your next appointment:   12 week(s)  The format for your next appointment:   In Person  Provider:   Thomasene Ripple  [facility] [address] [place], [place] [address]    Other Instructions   Important Information About Sugar

## 2022-06-05 ENCOUNTER — Telehealth: Payer: Self-pay | Admitting: Family Medicine

## 2022-06-05 NOTE — Telephone Encounter (Signed)
Called patient to schedule postpartum appointment, there was no answer to the phone call and the option to leave a voicemail was unavailable.

## 2022-06-09 ENCOUNTER — Telehealth: Payer: Self-pay | Admitting: *Deleted

## 2022-06-09 NOTE — Telephone Encounter (Signed)
Received message from Lanice Schwab - [facility] RN. She stated that she had seen pt in home on 8/25 and pt reported feelings of post partum depression and overwhelmed @ times. Pt's BP was 120/94 and pt is checking BP daily as well as taking BP med as prescribed. Olegario Messier advised pt that a nurse from our office would contact her.

## 2022-06-11 ENCOUNTER — Ambulatory Visit (INDEPENDENT_AMBULATORY_CARE_PROVIDER_SITE_OTHER): Payer: Medicaid Other | Admitting: Family Medicine

## 2022-06-11 ENCOUNTER — Encounter: Payer: Self-pay | Admitting: *Deleted

## 2022-06-11 DIAGNOSIS — N12 Tubulo-interstitial nephritis, not specified as acute or chronic: Secondary | ICD-10-CM | POA: Diagnosis not present

## 2022-06-11 DIAGNOSIS — R4586 Emotional lability: Secondary | ICD-10-CM

## 2022-06-11 DIAGNOSIS — O903 Peripartum cardiomyopathy: Secondary | ICD-10-CM

## 2022-06-11 NOTE — Progress Notes (Signed)
Post Partum Visit Note  Kathryn Pena is a 21 y.o. G10P2012 female who presents for a postpartum visit. She is 8 weeks postpartum following a normal spontaneous vaginal delivery.  I have fully reviewed the prenatal and intrapartum course. The delivery was at [redacted]w[redacted]d gestational weeks.  Anesthesia: epidural. Postpartum course has been very complicated with readmission for pyleo and PPCM. Baby is doing well. Baby is feeding by bottle - Similac Advance. Bleeding no bleeding. Bowel function is normal. Bladder function is normal. Patient is sexually active. Contraception method is none. Postpartum depression screening: POSITIVE   The pregnancy intention screening data noted above was reviewed. Potential methods of contraception were discussed. The patient elected to proceed with No data recorded.   Edinburgh Postnatal Depression Scale - 06/11/22 1619       Edinburgh Postnatal Depression Scale:  In the Past 7 Days   I have been able to laugh and see the funny side of things. 1    I have looked forward with enjoyment to things. 2    I have blamed myself unnecessarily when things went wrong. 2    I have been anxious or worried for no good reason. 0    I have felt scared or panicky for no good reason. 0    Things have been getting on top of me. 3    I have been so unhappy that I have had difficulty sleeping. 0    I have felt sad or miserable. 2    I have been so unhappy that I have been crying. 3    The thought of harming myself has occurred to me. 0    Edinburgh Postnatal Depression Scale Total 13             Health Maintenance Due  Topic Date Due   COVID-19 Vaccine (1) Never done   HPV VACCINES (1 - 2-dose series) Never done   INFLUENZA VACCINE  05/13/2022    The following portions of the patient's history were reviewed and updated as appropriate: allergies, current medications, past family history, past medical history, past social history, past surgical history, and problem  list.  Review of Systems Pertinent items are noted in HPI.  Objective:  BP 119/78   Pulse 75   Wt 217 lb 11.2 oz (98.7 kg)   LMP 05/13/2022 (Approximate)   Breastfeeding No   BMI 34.10 kg/m    General:  alert, cooperative, and appears stated age   Breasts:  not indicated  Lungs: clear to auscultation bilaterally  Heart:  regular rate and rhythm, S1, S2 normal, no murmur, click, rub or gallop  Abdomen: soft, non-tender; bowel sounds normal; no masses,  no organomegaly   Wound NA   GU exam:  not indicated       Assessment:   1. Mood altered - Ambulatory referral to Integrated Behavioral Health  2. Postpartum cardiomyopathy Resolved per visit with OB CARDS  3. Postpartum exam  4. Pyelonephritis Recommend urine sample   Normal postpartum exam.   Plan:   Essential components of care per ACOG recommendations:  1.  Mood and well being: Patient with positive depression screening today. Reviewed local resources for support. -- referred to [facility] - Patient tobacco use? No.   - hx of drug use? No.    2. Infant care and feeding:  -Patient currently breastmilk feeding? No.  -Social determinants of health (SDOH) reviewed in EPIC. No concerns  3. Sexuality, contraception and birth spacing - Patient does  not want a pregnancy in the next year.  Desired family size is 2 children.  - Reviewed reproductive life planning. Reviewed contraceptive methods based on pt preferences and effectiveness.  Patient desired IUD or IUS t-- uable to place because she recently had unprotected sex.  - Discussed birth spacing of 18 months  4. Sleep and fatigue -Encouraged family/partner/community support of 4 hrs of uninterrupted sleep to help with mood and fatigue  5. Physical Recovery  - Discussed patients delivery and complications. She describes her labor as good. - Patient had a VAVD with episiotomy.  Patient had a 2nd degree laceration. Perineal healing reviewed. Patient expressed  understanding - Patient has urinary incontinence? No. - Patient is safe to resume physical and sexual activity  6.  Health Maintenance - HM due items addressed Yes - Last pap smear  Diagnosis  Date Value Ref Range Status  08/05/2021   Final   - Negative for intraepithelial lesion or malignancy (NILM)   Pap smear not done at today's visit.  -Breast Cancer screening indicated? No.   7. Chronic Disease/Pregnancy Condition follow up: None  #Anemia-- get repeat CBC at IUD insertion visit   - PCP follow up  Future Appointments  Date Time Provider Department Center  07/02/2022  2:35 PM Federico Flake, MD [facility]  08/22/2022  9:20 AM Thomasene Ripple, DO [facility] None   Federico Flake, MD Center for [facility]

## 2022-06-12 ENCOUNTER — Telehealth: Payer: Self-pay | Admitting: Clinical

## 2022-06-12 NOTE — Telephone Encounter (Signed)
Attempt call regarding referral; Unable to leave voicemail as no voicemail available.

## 2022-06-13 ENCOUNTER — Encounter: Payer: Self-pay | Admitting: Family Medicine

## 2022-06-19 NOTE — BH Specialist Note (Unsigned)
Integrated Behavioral Health via Telemedicine Visit  06/19/2022 Kathryn Pena [id number]  Number of Integrated Behavioral Health Clinician visits: No data recorded Session Start time: No data recorded  Session End time: No data recorded Total time in minutes: No data recorded  Referring Provider: *** Patient/Family location: *** [facility] Provider location: *** All persons participating in visit: *** Types of Service: {CHL AMB TYPE OF SERVICE:[phone]}  I connected with [name] and/or Vincente Liberty A Romm's {family members:20773} via  Telephone or Engineer, civil (consulting)  (Video is Caregility application) and verified that I am speaking with the correct person using two identifiers. Discussed confidentiality: {YES/NO:21197}  I discussed the limitations of telemedicine and the availability of in person appointments.  Discussed there is a possibility of technology failure and discussed alternative modes of communication if that failure occurs.  I discussed that engaging in this telemedicine visit, they consent to the provision of behavioral healthcare and the services will be billed under their insurance.  Patient and/or legal guardian expressed understanding and consented to Telemedicine visit: {YES/NO:21197}  Presenting Concerns: Patient and/or family reports the following symptoms/concerns: *** Duration of problem: ***; Severity of problem: {Mild/Moderate/Severe:20260}  Patient and/or Family's Strengths/Protective Factors: {CHL AMB BH PROTECTIVE [phone]}  Goals Addressed: Patient will:  Reduce symptoms of: {IBH Symptoms:21014056}   Increase knowledge and/or ability of: {IBH Patient Tools:21014057}   Demonstrate ability to: {IBH Goals:21014053}  Progress towards Goals: {CHL AMB BH PROGRESS TOWARDS [phone]}  Interventions: Interventions utilized:  {IBH Interventions:21014054} Standardized Assessments completed: {IBH Screening  Tools:21014051}  Patient and/or Family Response: ***  Assessment: Patient currently experiencing ***.   Patient may benefit from ***.  Plan: Follow up with behavioral health clinician on : *** Behavioral recommendations: *** Referral(s): {IBH Referrals:21014055}  I discussed the assessment and treatment plan with the patient and/or parent/guardian. They were provided an opportunity to ask questions and all were answered. They agreed with the plan and demonstrated an understanding of the instructions.   They were advised to call back or seek an in-person evaluation if the symptoms worsen or if the condition fails to improve as anticipated.  Valetta Close Avina Eberle, LCSW

## 2022-06-25 ENCOUNTER — Ambulatory Visit (INDEPENDENT_AMBULATORY_CARE_PROVIDER_SITE_OTHER): Payer: Medicaid Other | Admitting: Clinical

## 2022-06-25 DIAGNOSIS — Z658 Other specified problems related to psychosocial circumstances: Secondary | ICD-10-CM

## 2022-06-25 DIAGNOSIS — F4323 Adjustment disorder with mixed anxiety and depressed mood: Secondary | ICD-10-CM | POA: Diagnosis not present

## 2022-06-25 NOTE — Patient Instructions (Signed)
Center for [facility] at [facility] [address] [place], [place] [address] [phone] (main office) [phone] (Eleanor Dimichele's office)  LIEAP (Low Income Energy Assistance Program) LowBlog.nl  [employer] (Low Income Automotive engineer) [email or web address]  New Parent Support Groups [email or web address] [email or web address]

## 2022-06-30 NOTE — BH Specialist Note (Unsigned)
Integrated Behavioral Health via Telemedicine Visit  07/09/2022 Kathryn Pena [id number]  Number of [place] Clinician visits: 2- Second Visit  Session Start time: 0350   Session End time: 0938  Total time in minutes: 21   Referring Provider: Caren Macadam, MD Patient/Family location: Home [facility] ([place]) Provider location: Center for [facility]  All persons participating in visit: Patient Kathryn Pena and [name]   Types of Service: Individual psychotherapy and Video visit  I connected with Kathryn Pena and/or Kathryn Pena's  n/a  via  Telephone or Geologist, engineering  (Video is Tree surgeon) and verified that I am speaking with the correct person using two identifiers. Discussed confidentiality: Yes   I discussed the limitations of telemedicine and the availability of in person appointments.  Discussed there is a possibility of technology failure and discussed alternative modes of communication if that failure occurs.  I discussed that engaging in this telemedicine visit, they consent to the provision of behavioral healthcare and the services will be billed under their insurance.  Patient and/or legal guardian expressed understanding and consented to Telemedicine visit: Yes   Presenting Concerns: Patient and/or family reports the following symptoms/concerns: Life stressors have decreased in past week and is "starting to be happy" again; using positive thoughts and self-coping strategies to manage emotions. Duration of problem: Increase postpartum; Severity of problem: moderate  Patient and/or Family's Strengths/Protective Factors: Social connections, Concrete supports in place (healthy food, safe environments, etc.), Sense of purpose, and Physical Health (exercise, healthy diet, medication compliance, etc.)  Goals Addressed: Patient will:  Reduce symptoms  of: anxiety, depression, and stress    Demonstrate ability to: Increase healthy adjustment to current life circumstances  Progress towards Goals: Ongoing  Interventions: Interventions utilized:  Supportive Reflection Standardized Assessments completed: Not Needed  Patient and/or Family Response: Patient agrees with treatment plan.   Assessment: Patient currently experiencing Adjustment disorder with mixed anxious and depressed mood and Psychosocial stress .   Patient may benefit from continued therapeutic interventions .  Plan: Follow up with behavioral health clinician on : Call Kathryn Pena at [phone], as needed. Behavioral recommendations:  -Continue using self-coping strategies daily as needed --Continue prioritizing healthy self-care (regular meals, adequate rest) -Consider new mom support group as needed at either [email or web address].net or [email or web address]  Referral(s): [place] (In Clinic)  I discussed the assessment and treatment plan with the patient and/or parent/guardian. They were provided an opportunity to ask questions and all were answered. They agreed with the plan and demonstrated an understanding of the instructions.   They were advised to call back or seek an in-person evaluation if the symptoms worsen or if the condition fails to improve as anticipated.  [place], LCSW     07/02/2022    5:04 PM 06/25/2022    2:57 PM 04/10/2022    1:36 PM 04/02/2022    5:01 PM 03/05/2022    4:59 PM  Depression screen PHQ 2/9  Decreased Interest 2 2 1 1 1   Down, Depressed, Hopeless 2 1 0 0 1  PHQ - 2 Score 4 3 1 1 2   Altered sleeping 2 0 0 1 2  Tired, decreased energy 2 1 1 1 1   Change in appetite 1 2 0 0 0  Feeling bad or failure about yourself  2 1 0 0 0  Trouble concentrating 0 0 0 0 0  Moving slowly or fidgety/restless 0 0 0  0 0  Suicidal thoughts 0 0 0 0 0  PHQ-9 Score 11 7 2 3 5       07/02/2022    5:04 PM 06/25/2022    2:59  PM 04/10/2022    1:36 PM 04/02/2022    5:01 PM  GAD 7 : Generalized Anxiety Score  Nervous, Anxious, on Edge 2 1 0 0  Control/stop worrying 3 2 0 0  Worry too much - different things 3 2 0 0  Trouble relaxing 2 0 0 0  Restless 1 0 0 0  Easily annoyed or irritable 3 2 1 1   Afraid - awful might happen 3 1 0 0  Total GAD 7 Score 17 8 1  1

## 2022-07-02 ENCOUNTER — Encounter: Payer: Self-pay | Admitting: Family Medicine

## 2022-07-02 ENCOUNTER — Other Ambulatory Visit: Payer: Self-pay

## 2022-07-02 ENCOUNTER — Ambulatory Visit (INDEPENDENT_AMBULATORY_CARE_PROVIDER_SITE_OTHER): Payer: Medicaid Other | Admitting: Family Medicine

## 2022-07-02 VITALS — BP 124/91 | HR 84 | Wt 212.5 lb

## 2022-07-02 DIAGNOSIS — Z30430 Encounter for insertion of intrauterine contraceptive device: Secondary | ICD-10-CM

## 2022-07-02 LAB — POCT PREGNANCY, URINE: Preg Test, Ur: NEGATIVE

## 2022-07-02 MED ORDER — LEVONORGESTREL 20.1 MCG/DAY IU IUD
1.0000 | INTRAUTERINE_SYSTEM | Freq: Once | INTRAUTERINE | Status: AC
  Administered 2022-07-02: 1 via INTRAUTERINE

## 2022-07-02 NOTE — Progress Notes (Signed)
   GYNECOLOGY PROBLEM  VISIT ENCOUNTER NOTE  Subjective:   Kathryn Pena is a 21 y.o. [phone] female here for a problem GYN visit.  Current complaints: here for IUD today. Has abstained from sex. UPT negative today. Saw Roselyn Reef and thought it was helpful..   Denies abnormal vaginal bleeding, discharge, pelvic pain, problems with intercourse or other gynecologic concerns.    Gynecologic History Patient's last menstrual period was 06/15/2022 (approximate).  Contraception: abstinence  Health Maintenance Due  Topic Date Due   COVID-19 Vaccine (1) Never done   HPV VACCINES (1 - 2-dose series) Never done   INFLUENZA VACCINE  05/13/2022    The following portions of the patient's history were reviewed and updated as appropriate: allergies, current medications, past family history, past medical history, past social history, past surgical history and problem list.  Review of Systems Pertinent items are noted in HPI.   Objective:  BP (!) 124/91   Pulse 84   Wt 212 lb 8 oz (96.4 kg)   LMP 06/15/2022 (Approximate)   Breastfeeding No   BMI 33.28 kg/m  Gen: well appearing, NAD HEENT: no scleral icterus CV: RR Lung: Normal WOB Ext: warm well perfused  PELVIC: Normal appearing external genitalia; normal appearing vaginal mucosa and cervix.  No abnormal discharge noted. Normal uterine size, no other palpable masses, no uterine or adnexal tenderness.  IUD Insertion Procedure Note Patient identified, informed consent performed, consent signed.   Discussed risks of irregular bleeding, cramping, infection, malpositioning or misplacement of the IUD outside the uterus which may require further procedure such as laparoscopy. Time out was performed.  Urine pregnancy test negative.  Speculum placed in the vagina.  Cervix visualized.  Cleaned with Betadine x 2.  Grasped anteriorly with a single tooth tenaculum.  Uterus sounded to 9 cm.  IUD placed per manufacturer's recommendations.  Strings trimmed  to 3 cm. Tenaculum was removed, good hemostasis noted.  Patient tolerated procedure well.     Assessment and Plan:  1. Encounter for IUD insertion Patient was given post-procedure instructions.  She was advised to have backup contraception for one week.  Patient was also asked to check IUD strings periodically and follow up in 4 weeks for IUD check. - Pregnancy, urine POC - levonorgestrel (LILETTA) 20.1 MCG/DAY IUD 1 each  Please refer to After Visit Summary for other counseling recommendations.   Return in about 4 weeks (around 07/30/2022) for IUD string check PRN if cannot feel strings.  Future Appointments  Date Time Provider [place]  07/09/2022  3:45 PM [facility]  08/22/2022  9:20 AM Tobb, Godfrey Pick, DO [facility] None    Caren Macadam, MD, MPH, ABFM Attending [place] for [facility]

## 2022-07-09 ENCOUNTER — Ambulatory Visit (INDEPENDENT_AMBULATORY_CARE_PROVIDER_SITE_OTHER): Payer: Medicaid Other | Admitting: Clinical

## 2022-07-09 DIAGNOSIS — F4323 Adjustment disorder with mixed anxiety and depressed mood: Secondary | ICD-10-CM

## 2022-07-09 DIAGNOSIS — Z658 Other specified problems related to psychosocial circumstances: Secondary | ICD-10-CM

## 2022-07-09 NOTE — Patient Instructions (Signed)
[facility] [address] [place], [place] [address] [phone] (main office) [phone] (Mycah Formica's office)   

## 2022-08-22 ENCOUNTER — Ambulatory Visit: Payer: Medicaid Other | Attending: Cardiology | Admitting: Cardiology

## 2022-08-28 ENCOUNTER — Encounter: Payer: Self-pay | Admitting: Cardiology

## 2022-11-15 ENCOUNTER — Encounter (HOSPITAL_COMMUNITY): Payer: Self-pay | Admitting: Emergency Medicine

## 2022-11-15 ENCOUNTER — Ambulatory Visit (HOSPITAL_COMMUNITY)
Admission: EM | Admit: 2022-11-15 | Discharge: 2022-11-15 | Disposition: A | Discharge status: Home / Self Care | Payer: Medicaid Other | Attending: Internal Medicine | Admitting: Internal Medicine

## 2022-11-15 DIAGNOSIS — J029 Acute pharyngitis, unspecified: Secondary | ICD-10-CM | POA: Insufficient documentation

## 2022-11-15 DIAGNOSIS — Z1152 Encounter for screening for COVID-19: Secondary | ICD-10-CM | POA: Insufficient documentation

## 2022-11-15 LAB — POCT RAPID STREP A, ED / UC: Streptococcus, Group A Screen (Direct): NEGATIVE

## 2022-11-15 MED ORDER — IBUPROFEN 800 MG PO TABS: 800.0000 mg | ORAL_TABLET | Freq: Three times a day (TID) | ORAL | 0 refills | Status: DC

## 2022-11-15 MED ORDER — IBUPROFEN 800 MG PO TABS
ORAL_TABLET | ORAL | Status: AC
  Filled 2022-11-15: qty 1

## 2022-11-15 MED ORDER — IBUPROFEN 800 MG PO TABS
800.0000 mg | ORAL_TABLET | Freq: Once | ORAL | Status: AC
  Administered 2022-11-15: 800 mg via ORAL

## 2022-11-15 NOTE — ED Provider Notes (Signed)
[place]    CSN: [id number] Arrival date & time: 11/15/22  1748      History   Chief Complaint Chief Complaint  Patient presents with   Sore Throat    HPI Kathryn Pena is a 22 y.o. female.   Patient presents to urgent care for evaluation of sore throat and bilateral neck pain for the last 4 days as well as chills without documented fever. Throat pain is currently an 8 on a scale of 0-10. She has been using ibuprofen 600mg  every 6 hours at home over the counter for throat pain and fever with some relief. She is a current everyday marijuana smoker, denies other drug use. Denies cough, nasal congestion/drainage, headache, vision changes, ear pain, and dizziness. No rash, nausea, vomiting, abdominal pain, or diarrhea. No recent antibiotics or steroids. No known sick contacts with similar symptoms. Last dose of ibuprofen was greater than 8 hours ago.    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 Authorizing Provider  ibuprofen (ADVIL) 800 MG tablet Take 1 tablet (800 mg total) by mouth 3 (three) times daily. 11/15/22  Yes Talbot Grumbling, FNP  acetaminophen (TYLENOL) 500 MG  tablet Take 500 mg by mouth every 6 (six) hours as needed for moderate pain. Patient not taking: Reported on 07/02/2022    [provider]  ferrous sulfate 325 (65 FE) MG tablet Take 1 tablet (325 mg total) by mouth daily with breakfast. 05/19/22 06/18/22  Arrien, Jimmy Picket, MD  metoprolol tartrate (LOPRESSOR) 25 MG tablet Take 0.5 tablets (12.5 mg total) by mouth 2 (two) times daily. 05/18/22 07/02/22  Arrien, Jimmy Picket, MD  prenatal vitamin w/FE, FA (NATACHEW) 29-1 MG CHEW chewable tablet Chew 1 tablet by mouth daily at 12 noon. Patient not taking: Reported on 07/02/2022 05/18/22   Arrien, Jimmy Picket, MD  enalapril (VASOTEC) 5 MG tablet Take 1 tablet (5 mg total) by mouth daily. 02/24/19 12/21/19  Chancy Milroy, MD    Family History Family History  Problem Relation Age of Onset   Healthy Mother    Hypertension Maternal Aunt    Diabetes Neg Hx    Heart disease Neg Hx    Stroke Neg Hx     Social History Social History   Tobacco Use   Smoking status: Every Day   Smokeless tobacco: Never   Tobacco comments:    Black N  Milds   Vaping Use   Vaping Use: Never used  Substance Use Topics   Alcohol use: No   Drug use: No     Allergies   Dilaudid [hydromorphone hcl]   Review of Systems Review of Systems Per HPI  Physical Exam Triage Vital Signs ED Triage Vitals  Enc Vitals Group     BP 11/15/22 1844 128/87     Pulse Rate 11/15/22 1844 (!) 107     Resp 11/15/22 1844 16     Temp 11/15/22 1844 100.3 F (37.9 C)     Temp Source 11/15/22 1844 Oral     SpO2 11/15/22 1844 99 %     Weight --      Height --      Head Circumference --      Peak Flow --      Pain Score 11/15/22 1843 8     Pain Loc --      Pain Edu? --      Excl. in [place]? --    No data found.  Updated Vital Signs BP 128/87 (BP Location: Right Arm)   Pulse (!) 107   Temp 100.3 F (37.9 C) (Oral)   Resp 16   SpO2 99%   Visual Acuity Right Eye Distance:   Left Eye Distance:    Bilateral Distance:    Right Eye Near:   Left Eye Near:    Bilateral Near:     Physical Exam Vitals and nursing note reviewed.  Constitutional:      Appearance: She is not ill-appearing or toxic-appearing.  HENT:     Head: Normocephalic and atraumatic.     Right Ear: Hearing, tympanic membrane, ear canal and external ear normal.     Left Ear: Hearing, tympanic membrane, ear canal and external ear normal.     Nose: Nose normal. No congestion.     Mouth/Throat:     Lips: Pink.     Mouth: Mucous membranes are moist. No oral lesions.     Pharynx: Pharyngeal swelling and posterior oropharyngeal erythema present. No oropharyngeal exudate or uvula swelling.  Eyes:     General: Lids are normal. Vision grossly intact. Gaze aligned appropriately.     Extraocular Movements: Extraocular movements intact.     Conjunctiva/sclera: Conjunctivae normal.  Cardiovascular:     Rate and Rhythm: Normal rate and regular rhythm.     Heart sounds: Normal heart sounds, S1 normal and S2 normal.  Pulmonary:     Effort: Pulmonary effort is normal. No respiratory distress.     Breath sounds: Normal breath sounds and air entry.  Musculoskeletal:     Cervical back: Neck supple.  Skin:    General: Skin is warm and dry.     Capillary Refill: Capillary refill takes less than 2 seconds.     Findings: No rash.  Neurological:     General: No focal deficit present.     Mental Status: She is alert and oriented to person, place, and time. Mental status is at baseline.     Cranial Nerves: No dysarthria or facial asymmetry.  Psychiatric:        Mood and Affect: Mood normal.        Speech: Speech normal.        Behavior: Behavior normal.        Thought Content: Thought content normal.        Judgment: Judgment normal.      UC Treatments / Results  Labs (all labs  ordered are listed, but only abnormal results are displayed) Labs Reviewed  SARS CORONAVIRUS 2 (TAT 6-24 HRS)  CULTURE, GROUP A STREP [facility])   POCT RAPID STREP A, ED / UC    EKG   Radiology No results found.  Procedures Procedures (including critical care time)  Medications Ordered in UC Medications  ibuprofen (ADVIL) tablet 800 mg (800 mg Oral Given 11/15/22 1917)    Initial Impression / Assessment and Plan / UC Course  I have reviewed the triage vital signs and the nursing notes.  Pertinent labs & imaging results that were available during my care of the patient were reviewed by me and considered in my medical decision making (see chart for details).   1. Acute viral pharyngitis Group A strep testing in clinic is negative, throat culture is pending. Will call patient with positive results and treat based on throat culture if necessary. Deferred monospot testing today due to timing of illness and clinical presentation. Low suspicion for peritonsillar abscess, HEENT exam is stable and without red flag signs. Patient to continue using over the counter medications for symptomatic relief. May use salt water gargles and honey in warm water/warm tea for further symptomatic relief.  Given ibuprofen 800mg  in clinic for symptoms. Patient requesting ibuprofen 800mg  prescription to be sent to pharmacy, this has been sent and may be taken every 8 hours as needed for throat pain and fever/chills.   Discussed physical exam and available lab work findings in clinic with patient.  Counseled patient regarding appropriate use of medications and potential side effects for all medications recommended or prescribed today. Discussed red flag signs and symptoms of worsening condition,when to call the PCP office, return to urgent care, and when to seek higher level of care in the emergency department. Patient verbalizes understanding and agreement with plan. All questions answered. Patient discharged in stable condition.    Final Clinical Impressions(s) / UC Diagnoses   Final diagnoses:  Acute viral pharyngitis     Discharge Instructions       Your strep testing of your throat in the clinic is negative. Throat culture has been sent to the lab to further check for bacteria to the back of your throat, staff will call you if this is positive in the next 2-3 days.   Use the following medicines to help with symptoms: - Ibuprofen 600mg  and/or Tylenol 1,000mg  every 6 hours with food as needed for aches/pains or fever/chills.  - 1 tablespoon of honey in warm water and/or salt water gargles may also help with symptoms.   If you develop any new or worsening symptoms, please return.  If your symptoms are severe, please go to the emergency room.  Follow-up with your primary care provider for further evaluation and management of your symptoms as well as ongoing wellness visits.  I hope you feel better!     ED Prescriptions     Medication Sig Dispense Auth. Provider   ibuprofen (ADVIL) 800 MG tablet Take 1 tablet (800 mg total) by mouth 3 (three) times daily. 21 tablet Talbot Grumbling, FNP      PDMP not reviewed this encounter.   Talbot Grumbling, [place] 11/15/22 2145

## 2022-11-15 NOTE — Discharge Instructions (Signed)
Your strep testing of your throat in the clinic is negative. Throat culture has been sent to the lab to further check for bacteria to the back of your throat, staff will call you if this is positive in the next 2-3 days.   Use the following medicines to help with symptoms: - Ibuprofen 600mg and/or Tylenol 1,000mg every 6 hours with food as needed for aches/pains or fever/chills.  - 1 tablespoon of honey in warm water and/or salt water gargles may also help with symptoms.   If you develop any new or worsening symptoms, please return.  If your symptoms are severe, please go to the emergency room.  Follow-up with your primary care provider for further evaluation and management of your symptoms as well as ongoing wellness visits.  I hope you feel better!   

## 2022-11-15 NOTE — ED Triage Notes (Signed)
Sore throat x 4 days, pain with swallowing. Denies fever, nasal congestion, abdominal pain, N/V

## 2022-11-16 ENCOUNTER — Encounter (HOSPITAL_COMMUNITY): Payer: Self-pay | Admitting: Emergency Medicine

## 2022-11-16 ENCOUNTER — Emergency Department (HOSPITAL_COMMUNITY): Payer: Medicaid Other

## 2022-11-16 ENCOUNTER — Emergency Department (HOSPITAL_COMMUNITY)
Admission: EM | Admit: 2022-11-16 | Discharge: 2022-11-16 | Disposition: A | Discharge status: Home / Self Care | Payer: Medicaid Other | Attending: Emergency Medicine | Admitting: Emergency Medicine

## 2022-11-16 DIAGNOSIS — Z20822 Contact with and (suspected) exposure to covid-19: Secondary | ICD-10-CM | POA: Diagnosis not present

## 2022-11-16 DIAGNOSIS — J039 Acute tonsillitis, unspecified: Secondary | ICD-10-CM | POA: Diagnosis not present

## 2022-11-16 DIAGNOSIS — J029 Acute pharyngitis, unspecified: Secondary | ICD-10-CM | POA: Diagnosis present

## 2022-11-16 LAB — RESP PANEL BY RT-PCR (RSV, FLU A&B, COVID)  RVPGX2
Influenza A by PCR: NEGATIVE
Influenza B by PCR: NEGATIVE
Resp Syncytial Virus by PCR: NEGATIVE
SARS Coronavirus 2 by RT PCR: NEGATIVE

## 2022-11-16 LAB — I-STAT CHEM 8, ED
BUN: 7 mg/dL (ref 6–20)
Calcium, Ion: 1.17 mmol/L (ref 1.15–1.40)
Chloride: 104 mmol/L (ref 98–111)
Creatinine, Ser: 0.9 mg/dL (ref 0.44–1.00)
Glucose, Bld: 90 mg/dL (ref 70–99)
HCT: 41 % (ref 36.0–46.0)
Hemoglobin: 13.9 g/dL (ref 12.0–15.0)
Potassium: 3.7 mmol/L (ref 3.5–5.1)
Sodium: 138 mmol/L (ref 135–145)
TCO2: 22 mmol/L (ref 22–32)

## 2022-11-16 LAB — I-STAT BETA HCG BLOOD, ED (MC, WL, AP ONLY): I-stat hCG, quantitative: <5 m[IU]/mL (ref <5)

## 2022-11-16 LAB — GROUP A STREP BY PCR: Group A Strep by PCR: NOT DETECTED

## 2022-11-16 LAB — MONONUCLEOSIS SCREEN: Mono Screen: NEGATIVE

## 2022-11-16 LAB — SARS CORONAVIRUS 2 (TAT 6-24 HRS): SARS Coronavirus 2: NEGATIVE

## 2022-11-16 MED ORDER — ACETAMINOPHEN 500 MG PO TABS
1000.0000 mg | ORAL_TABLET | Freq: Once | ORAL | Status: AC
  Administered 2022-11-16: 1000 mg via ORAL
  Filled 2022-11-16: qty 2

## 2022-11-16 MED ORDER — IOHEXOL 350 MG/ML SOLN
75.0000 mL | Freq: Once | INTRAVENOUS | Status: AC | PRN
  Administered 2022-11-16: 75 mL via INTRAVENOUS

## 2022-11-16 MED ORDER — PREDNISONE 10 MG (21) PO TBPK: ORAL_TABLET | Freq: Every day | ORAL | 0 refills | Status: DC

## 2022-11-16 MED ORDER — DEXAMETHASONE SODIUM PHOSPHATE 10 MG/ML IJ SOLN
10.0000 mg | Freq: Once | INTRAMUSCULAR | Status: AC
  Administered 2022-11-16: 10 mg via INTRAVENOUS
  Filled 2022-11-16: qty 1

## 2022-11-16 NOTE — ED Provider Notes (Signed)
[facility] Provider Note   CSN: [id number] Arrival date & time: 11/16/22  1640     History  Chief Complaint  Patient presents with   Sore Throat    Kathryn Pena is a 22 y.o. female.   Sore Throat     This is a 22 year old female presenting to the emergency department due to sore throat x 4 to 5 days.  She was seen in urgent care yesterday and had negative strep and COVID testing.  Despite that she has been feeling worse starting today, her voice sounds different to her and she is having difficulty swallowing.  She did have a fever on arrival, no antipyretics taken.  Home Medications Prior to Admission medications   Medication Sig Start Date End Date Taking? Authorizing Provider  predniSONE (STERAPRED UNI-PAK 21 TAB) 10 MG (21) TBPK tablet Take by mouth daily. Take 6 tabs by mouth daily  for 2 days, then 5 tabs for 2 days, then 4 tabs for 2 days, then 3 tabs for 2 days, 2 tabs for 2 days, then 1 tab by mouth daily for 2 days 11/16/22  Yes Sherrill Raring, PA-C  acetaminophen (TYLENOL) 500 MG tablet Take 500 mg by mouth every 6 (six) hours as needed for moderate pain. Patient not taking: Reported on 07/02/2022    [provider]  ferrous sulfate 325 (65 FE) MG tablet Take 1 tablet (325 mg total) by mouth daily with breakfast. 05/19/22 06/18/22  Arrien, Jimmy Picket, MD  ibuprofen (ADVIL) 800 MG tablet Take 1 tablet (800 mg total) by mouth 3 (three) times daily. 11/15/22   Talbot Grumbling, FNP  metoprolol tartrate (LOPRESSOR) 25 MG tablet Take 0.5 tablets (12.5 mg total) by mouth 2 (two) times daily. 05/18/22 07/02/22  Arrien, Jimmy Picket, MD  prenatal vitamin w/FE, FA (NATACHEW) 29-1 MG CHEW chewable tablet Chew 1 tablet by mouth daily at 12 noon. Patient not taking: Reported on 07/02/2022 05/18/22   Arrien, Jimmy Picket, MD  enalapril (VASOTEC) 5 MG tablet Take 1 tablet (5 mg total) by mouth daily. 02/24/19 12/21/19  Chancy Milroy, MD      Allergies    Dilaudid [hydromorphone hcl]    Review of Systems   Review of Systems  Physical Exam Updated Vital Signs BP (!) 158/73   Pulse (!) 122   Temp 99.8 F (37.7 C) (Oral)   Resp 18   SpO2 100%  Physical Exam Vitals and nursing note reviewed. Exam conducted with a chaperone present.  Constitutional:      Appearance: Normal appearance.  HENT:     Head: Normocephalic and atraumatic.     Mouth/Throat:     Comments: Erythema to the posterior oropharynx with anterior chain lymphadenopathy.  Slight dysphonia Eyes:     General: No scleral icterus.       Right eye: No discharge.        Left eye: No discharge.     Extraocular Movements: Extraocular movements intact.     Pupils: Pupils are equal, round, and reactive to light.  Cardiovascular:     Rate and Rhythm: Normal rate and regular rhythm.     Pulses: Normal pulses.     Heart sounds: Normal heart sounds. No murmur heard.    No friction rub. No gallop.  Pulmonary:     Effort: Pulmonary effort is normal. No respiratory distress.     Breath sounds: Normal breath sounds.  Abdominal:  General: Abdomen is flat. Bowel sounds are normal. There is no distension.     Palpations: Abdomen is soft.     Tenderness: There is no abdominal tenderness.  Skin:    General: Skin is warm and dry.     Coloration: Skin is not jaundiced.  Neurological:     Mental Status: She is alert. Mental status is at baseline.     Coordination: Coordination normal.     ED Results / Procedures / Treatments   Labs (all labs ordered are listed, but only abnormal results are displayed) Labs Reviewed  GROUP A STREP BY PCR  RESP PANEL BY RT-PCR (RSV, FLU A&B, COVID)  [id number]  MONONUCLEOSIS SCREEN  I-STAT CHEM 8, ED  I-STAT BETA HCG BLOOD, ED ([facility], [facility], [facility] ONLY)    EKG None  Radiology No results found.  Procedures Procedures    Medications Ordered in ED Medications  dexamethasone (DECADRON) injection 10 mg (10 mg  Intravenous Given 11/16/22 1802)  acetaminophen (TYLENOL) tablet 1,000 mg (1,000 mg Oral Given 11/16/22 1802)  iohexol (OMNIPAQUE) 350 MG/ML injection 75 mL (75 mLs Intravenous Contrast Given 11/16/22 1853)    ED Course/ Medical Decision Making/ A&P Clinical Course as of 11/16/22 1938  Sun Nov 16, 2022  1914 Resp panel by RT-PCR (RSV, Flu A&B, Covid) Anterior Nasal Swab Negative [HS]  1915 Group A Strep by PCR neg [HS]  1915 I-Stat beta hCG blood, ED Not pregnant  [HS]  1915 I-stat chem 8, ED Normal cr, okay for CT [HS]    Clinical Course User Index [HS] Sherrill Raring, PA-C                             Medical Decision Making Amount and/or Complexity of Data Reviewed Labs: ordered. Decision-making details documented in ED Course. Radiology: ordered.  Risk OTC drugs. Prescription drug management.   This is a 22 year old female presenting to the emergency department due to sore throat.  Differential includes but not limited to peritonsillar abscess, retropharyngeal abscess, viral tonsillitis, strep pharyngitis, mono.  No signs of Ludwig angina on exam.  She does not appear septic although she does have a fever and is tachycardic.  Will repeat viral panel, strep test and monotest.  Will also proceed with CT imaging of the neck given change in phonation.  I ordered Decadron for the patient.  On reevaluation patient's pain improved with the Decadron.  The CT of her soft tissue shows adenopathy and tonsillitis but no phlegmon or peritonsillar abscess or retropharyngeal abscess.  I agree with radiologist.  Discussed the workup with the patient, will send home with steroid pack and ENT follow-up as needed.  Return precaution discussed, stable for discharge at this time.  Do not see any indication for antibiotics suspect this most likely viral in etiology.        Final Clinical Impression(s) / ED Diagnoses Final diagnoses:  Tonsillitis    Rx / DC Orders ED Discharge Orders           Ordered    predniSONE (STERAPRED UNI-PAK 21 TAB) 10 MG (21) TBPK tablet  Daily        11/16/22 1938              Sherrill Raring, PA-C 11/16/22 1939    Fransico Meadow, MD 11/17/22 [phone]

## 2022-11-16 NOTE — Discharge Instructions (Addendum)
You can take Tylenol and Motrin as needed for pain.  Take the steroids as prescribed to help with inflammation.  Follow-up with ENT if no improvement in the next week or so.  Return to the ED if you have any difficulty swallowing, facial swelling, new or concerning symptoms.

## 2022-11-16 NOTE — ED Triage Notes (Signed)
Pt reports sore throat for 4 days. Denies any chills. Seen at [facility] yesterday and rapid strep and covid negative.

## 2022-11-18 LAB — CULTURE, GROUP A STREP (THRC)

## 2022-11-20 ENCOUNTER — Ambulatory Visit (INDEPENDENT_AMBULATORY_CARE_PROVIDER_SITE_OTHER): Payer: Medicaid Other

## 2022-11-20 ENCOUNTER — Other Ambulatory Visit (HOSPITAL_COMMUNITY)
Admission: RE | Admit: 2022-11-20 | Discharge: 2022-11-20 | Disposition: A | Discharge status: Home / Self Care | Payer: Medicaid Other | Source: Ambulatory Visit | Attending: Family Medicine | Admitting: Family Medicine

## 2022-11-20 ENCOUNTER — Other Ambulatory Visit: Payer: Self-pay

## 2022-11-20 VITALS — BP 125/88 | HR 69 | Wt 216.5 lb

## 2022-11-20 DIAGNOSIS — Z113 Encounter for screening for infections with a predominantly sexual mode of transmission: Secondary | ICD-10-CM | POA: Diagnosis not present

## 2022-11-20 DIAGNOSIS — R350 Frequency of micturition: Secondary | ICD-10-CM

## 2022-11-20 DIAGNOSIS — R339 Retention of urine, unspecified: Secondary | ICD-10-CM

## 2022-11-20 DIAGNOSIS — R300 Dysuria: Secondary | ICD-10-CM

## 2022-11-20 LAB — POCT URINALYSIS DIP (DEVICE)
Bilirubin Urine: NEGATIVE
Glucose, UA: NEGATIVE mg/dL
Hgb urine dipstick: NEGATIVE
Ketones, ur: NEGATIVE mg/dL
Leukocytes,Ua: NEGATIVE
Nitrite: NEGATIVE
Protein, ur: NEGATIVE mg/dL
Specific Gravity, Urine: 1.015 (ref 1.005–1.030)
Urobilinogen, UA: 0.2 mg/dL (ref 0.0–1.0)
pH: 7 (ref 5.0–8.0)

## 2022-11-20 MED ORDER — NITROFURANTOIN MONOHYD MACRO 100 MG PO CAPS: 100.0000 mg | ORAL_CAPSULE | Freq: Two times a day (BID) | ORAL | 0 refills | Status: DC

## 2022-11-20 NOTE — Progress Notes (Signed)
Kathryn Pena is here for STD screening. Vaginal swab collected by patient and labs drawn.  Also reports lower abdominal cramping, burning with urination, incomplete emptying of bladder, and frequency. Symptoms have been present for 5 days. UA is unremarkable. Reviewed with Dione Plover MD who recommends urine be sent for culture and presumptive treatment for UTI with Macrobid.  Annabell Howells, RN 11/20/2022  11:52 AM

## 2022-11-21 LAB — HIV ANTIBODY (ROUTINE TESTING W REFLEX): HIV Screen 4th Generation wRfx: NONREACTIVE

## 2022-11-21 LAB — RPR: RPR Ser Ql: NONREACTIVE

## 2022-11-21 LAB — HEPATITIS B SURFACE ANTIGEN: Hepatitis B Surface Ag: NEGATIVE

## 2022-11-22 LAB — URINE CULTURE

## 2022-11-24 LAB — CERVICOVAGINAL ANCILLARY ONLY
Chlamydia: NEGATIVE
Comment: NEGATIVE
Comment: NEGATIVE
Comment: NORMAL
Neisseria Gonorrhea: NEGATIVE
Trichomonas: NEGATIVE

## 2023-02-18 ENCOUNTER — Ambulatory Visit: Payer: Medicaid Other | Admitting: Obstetrics and Gynecology

## 2023-02-18 ENCOUNTER — Telehealth: Payer: Self-pay | Admitting: Obstetrics and Gynecology

## 2023-02-18 NOTE — Telephone Encounter (Signed)
Patient was schedule today and the provider is not in, called the pateintand was unable to get through, we have her rescheduled for tomorrow ( 02-19-2023) at 10:35

## 2023-02-19 ENCOUNTER — Ambulatory Visit: Payer: Medicaid Other | Admitting: Obstetrics and Gynecology

## 2023-07-25 IMAGING — US US MFM OB FOLLOW-UP
1 series · 13 of 28 positions shown · non-contrast
Comparison: none

[Series 1: us mfm ob follow-up · 31 acquisitions, 13 frames shown]
[im 2/31]
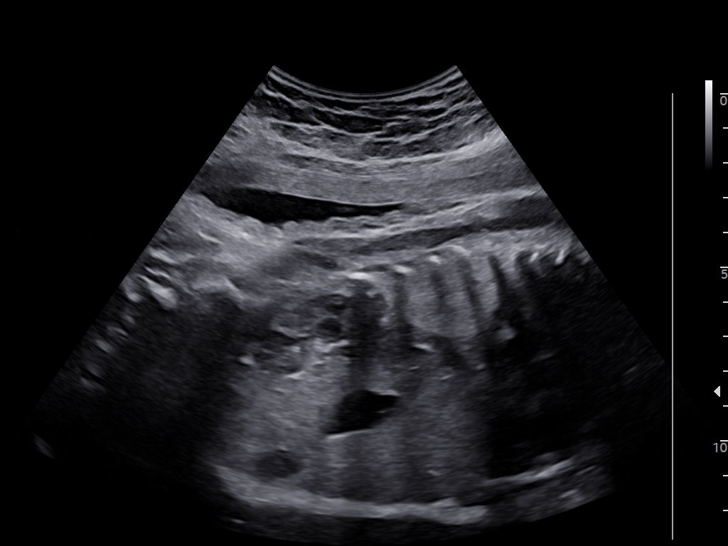
[im 4/31]
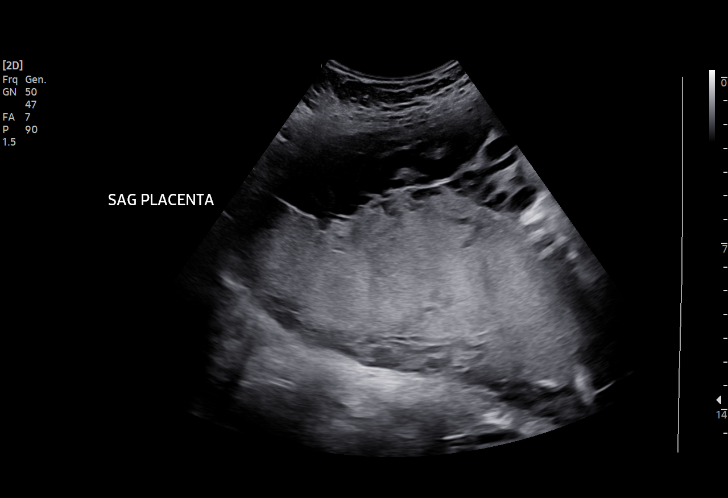
[im 6/31]
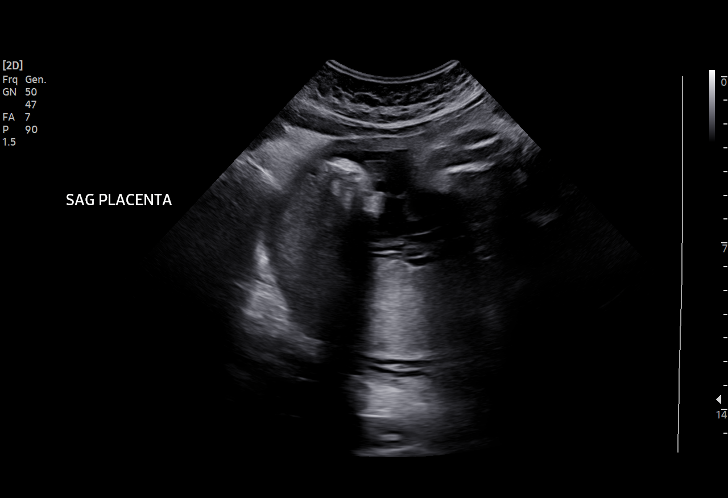
[im 8/31]
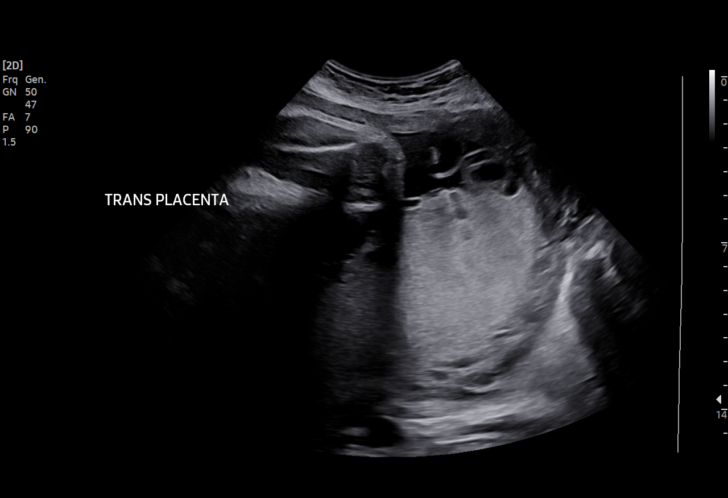
[im 11/31]
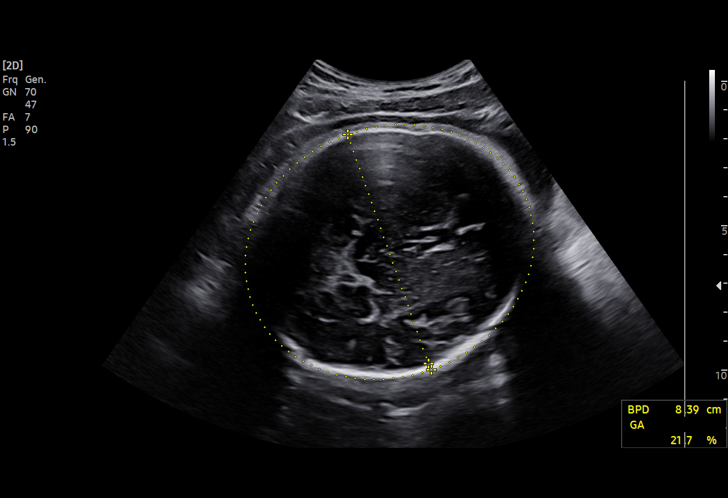
[im 13/31]
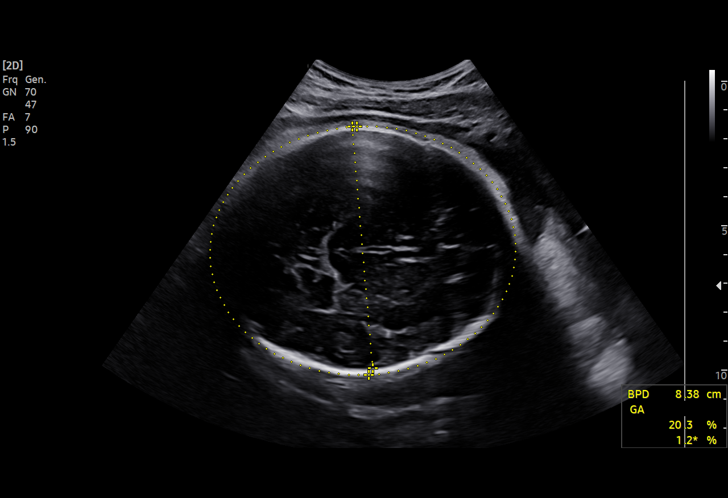
[im 16/31]
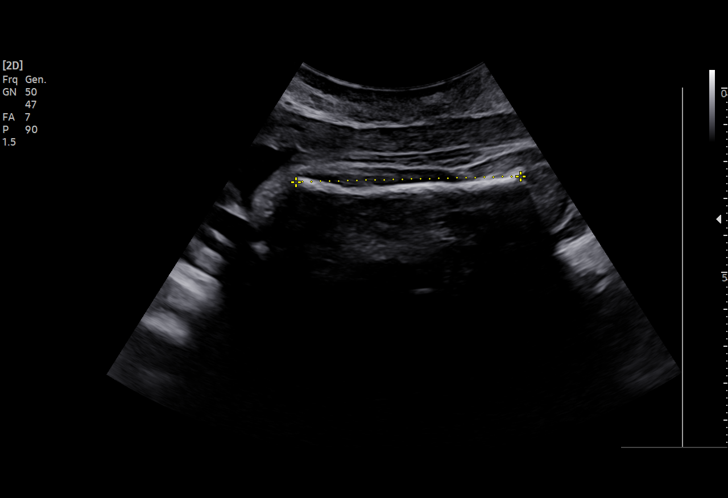
[im 18/31]
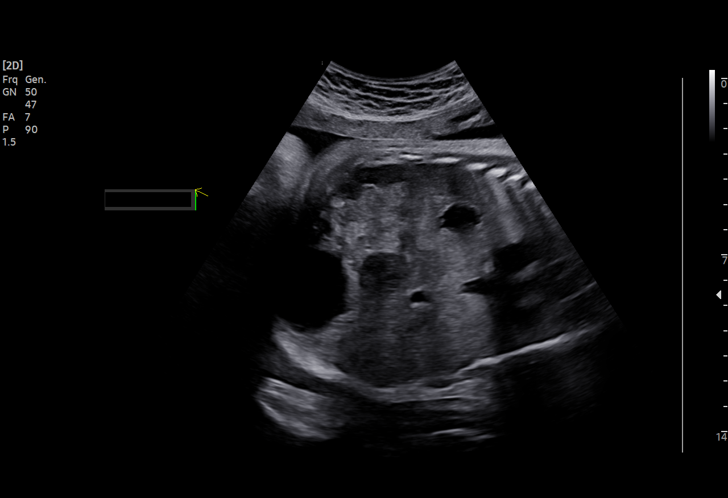
[im 21/31]
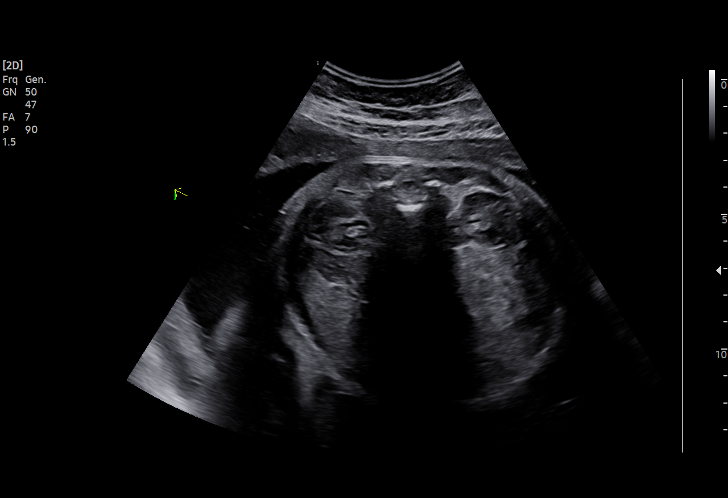
[im 23/31]
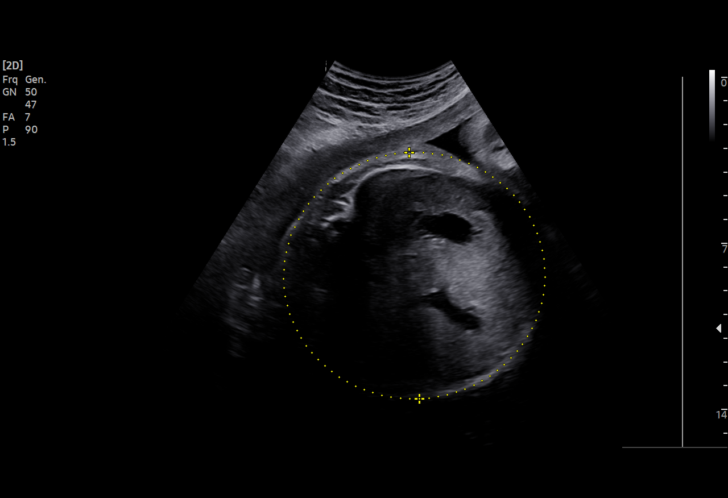
[im 25/31]
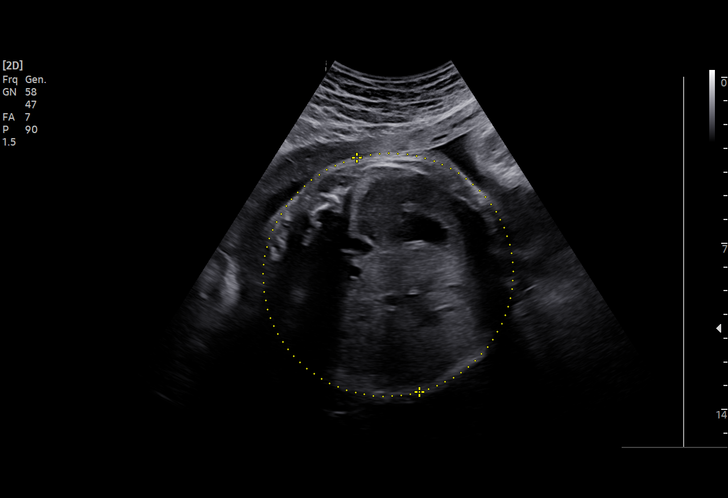
[im 27/31]
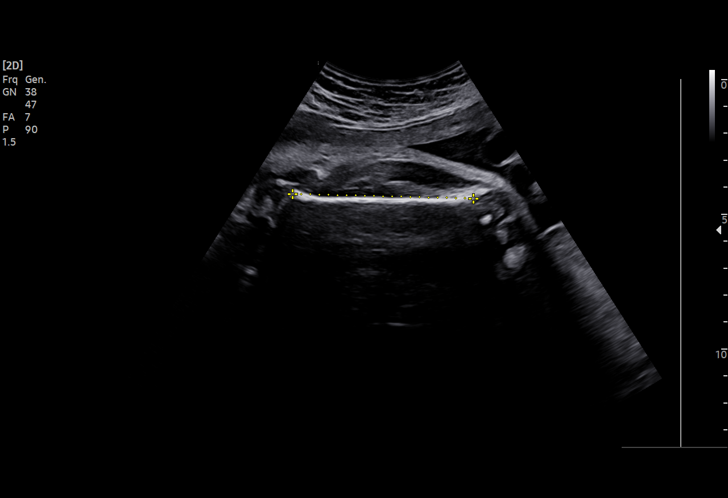
[im 29/31]
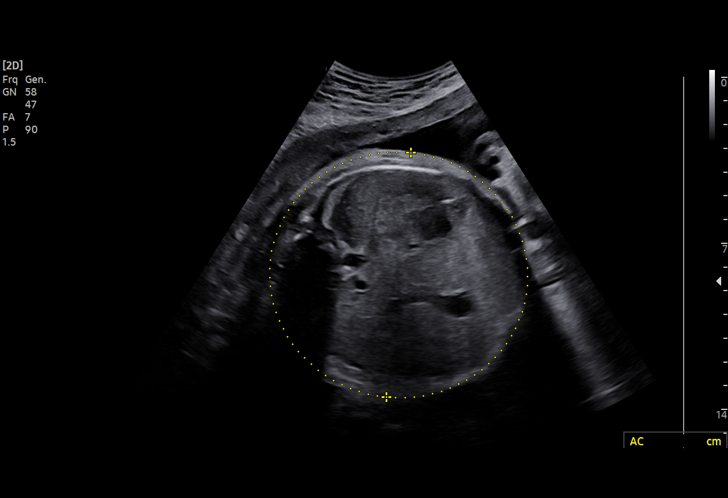

[13 of 28 positions shown; findings below may reference images not displayed]

Indications

 Poor obstetric history: Previous
 preeclampsia / eclampsia/gestational HTN
 34 weeks gestation of pregnancy
 LOW risk NIPS / Negative Horizon
 Encounter for other antenatal screening
 follow-up
Fetal Evaluation

 Num Of Fetuses:         1
 Fetal Heart Rate(bpm):  135
 Cardiac Activity:       Observed
 Presentation:           Cephalic
 Placenta:               Posterior
 P. Cord Insertion:      Previously Visualized

 Amniotic Fluid
 AFI FV:      Within normal limits

 AFI Sum(cm)     %Tile       Largest Pocket(cm)
 15.41           56

 RUQ(cm)       RLQ(cm)       LUQ(cm)        LLQ(cm)

Biometry
 BPD:        84  mm     G. Age:  33w 6d         22  %    CI:        78.26   %    70 - 86
                                                         FL/HC:      22.5   %    20.1 -
 HC:      300.4  mm     G. Age:  33w 2d        1.9  %    HC/AC:      0.91        0.93 -
 AC:      328.6  mm     G. Age:  36w 5d         95  %    FL/BPD:     80.4   %    71 - 87
 FL:       67.5  mm     G. Age:  34w 5d         38  %    FL/AC:      20.5   %    20 - 24
 HUM:      60.2  mm     G. Age:  35w 0d         65  %

 LV:        4.6  mm

 Est. FW:    2115  gm    5 lb 15 oz      67  %
OB History

 Gravidity:    3         Term:   1        Prem:   0        SAB:   0
 TOP:          1       Ectopic:  0        Living: 1
Gestational Age

 LMP:           38w 1d        Date:  06/18/21                   EDD:   03/25/22
 U/S Today:     34w 5d                                        EDD:   04/18/22
 Best:          34w 6d     Det. By:  Early Ultrasound         EDD:   04/17/22
                                     (09/10/21)
Anatomy

 Cranium:               Appears normal         LVOT:                   Previously seen
 Cavum:                 Previously seen        Aortic Arch:            Previously seen
 Ventricles:            Appears normal         Ductal Arch:            Previously seen
 Choroid Plexus:        Previously seen        Diaphragm:              Appears normal
 Cerebellum:            Previously seen        Stomach:                Appears normal, left
                                                                       sided
 Posterior Fossa:       Previously seen        Abdomen:                Previously seen
 Nuchal Fold:           Previously seen        Abdominal Wall:         Previously seen
 Face:                  Orbits and profile     Cord Vessels:           Previously seen
                        previously seen
 Lips:                  Previously seen        Kidneys:                Appear normal
 Palate:                Not well visualized    Bladder:                Appears normal
 Thoracic:              Appears normal         Spine:                  Previously seen
 Heart:                 Appears normal         Upper Extremities:      Previously seen
                        (4CH, axis, and
                        situs)
 RVOT:                  Previously seen        Lower Extremities:      Previously seen

 Other:  Female gender previously seen. VC, 3VV, 3VTV, Heels prev
         visualized.
Cervix Uterus Adnexa

 Cervix
 Not visualized (advanced GA >91wks)

 Right Ovary
 Previously seen

 Left Ovary
 Previously seen.
Impression
 History of preeclampsia.
 Patient does not have gestational diabetes.  Blood pressure
 today at her office is 132/79 mmHg.
 Fetal growth is appropriate for gestational age.  Amniotic fluid
 is normal and good fetal activity seen.
 I reassured the patient of the findings.
Recommendations

 Follow-up scans as clinically indicated.
                Ana Maria, Aliona

## 2023-07-27 ENCOUNTER — Encounter (HOSPITAL_COMMUNITY): Payer: Self-pay | Admitting: Emergency Medicine

## 2023-07-27 ENCOUNTER — Ambulatory Visit (HOSPITAL_COMMUNITY)
Admission: EM | Admit: 2023-07-27 | Discharge: 2023-07-27 | Disposition: A | Discharge status: Home / Self Care | Payer: Medicaid Other | Attending: Internal Medicine | Admitting: Internal Medicine

## 2023-07-27 DIAGNOSIS — Z202 Contact with and (suspected) exposure to infections with a predominantly sexual mode of transmission: Secondary | ICD-10-CM | POA: Diagnosis not present

## 2023-07-27 LAB — HIV ANTIBODY (ROUTINE TESTING W REFLEX): HIV Screen 4th Generation wRfx: NONREACTIVE

## 2023-07-27 MED ORDER — DOXYCYCLINE HYCLATE 100 MG PO CAPS: 100.0000 mg | ORAL_CAPSULE | Freq: Two times a day (BID) | ORAL | 0 refills | Status: AC

## 2023-07-27 NOTE — ED Triage Notes (Signed)
Patient states that her partner informed her that he tested positive for chlamydia.  Patient denies any sx's.  Patient requesting testing for all STI's.

## 2023-07-27 NOTE — ED Provider Notes (Signed)
[facility]    CSN: [id number] Arrival date & time: 07/27/23  1644      History   Chief Complaint Chief Complaint  Patient presents with   STI exposure    HPI Kathryn Pena is a 22 y.o. female.   HPI Partner informed her he has chlamydia, wants STI testing, no symptoms.   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 Authorizing Provider  doxycycline (VIBRAMYCIN) 100 MG capsule Take 1 capsule (100 mg total) by mouth 2 (two) times daily for 7 days. 07/27/23 08/03/23 Yes Meliton Rattan, PA  ibuprofen (ADVIL) 800 MG tablet Take 1 tablet (800 mg total) by mouth 3 (three) times daily. 11/15/22  Yes Carlisle Beers, FNP  acetaminophen (TYLENOL) 500 MG tablet Take 500 mg by mouth every 6 (six) hours as needed for moderate pain. Patient not taking: Reported on 07/02/2022    [provider]  ferrous sulfate 325 (65 FE) MG tablet Take 1 tablet (325 mg total) by mouth daily with breakfast. 05/19/22 06/18/22  Arrien, York Ram, MD  metoprolol tartrate (LOPRESSOR) 25 MG tablet Take 0.5 tablets (12.5 mg total) by mouth 2 (two) times daily. 05/18/22 07/02/22  Arrien, York Ram, MD  nitrofurantoin, macrocrystal-monohydrate, (MACROBID) 100 MG capsule Take 1 capsule (100 mg total) by mouth 2 (two) times daily. 11/20/22   Venora Maples, MD  predniSONE (STERAPRED UNI-PAK 21 TAB) 10 MG (21) TBPK tablet Take by mouth daily. Take 6 tabs by mouth daily  for 2 days, then 5 tabs for 2 days, then 4 tabs for 2 days, then 3 tabs for 2 days, 2 tabs for 2 days, then 1 tab by mouth daily for 2 days 11/16/22   Theron Arista, PA-C  prenatal vitamin w/FE, FA (NATACHEW) 29-1 MG CHEW chewable tablet Chew 1 tablet by mouth daily at 12 noon. Patient not taking: Reported on 07/02/2022 05/18/22   Arrien, York Ram, MD  enalapril (VASOTEC) 5 MG tablet Take 1 tablet (5 mg total) by mouth daily. 02/24/19 12/21/19  Hermina Staggers, MD    Family History Family History  Problem Relation Age of Onset   Healthy Mother    Hypertension Maternal Aunt    Diabetes Neg Hx    Heart disease Neg Hx    Stroke Neg Hx     Social History Social History   Tobacco Use   Smoking status: Some  Days    Types: Cigarettes   Smokeless tobacco: Never   Tobacco comments:    Black N Milds   Vaping Use   Vaping status: Every Day  Substance Use Topics   Alcohol use: No   Drug use: No     Allergies   Dilaudid [hydromorphone hcl]   Review of Systems Review of Systems   Physical Exam Triage Vital Signs ED Triage Vitals  Encounter Vitals Group     BP 07/27/23 1746 130/87     Systolic BP Percentile --      Diastolic BP Percentile --      Pulse Rate 07/27/23 1746 62     Resp 07/27/23 1746 16     Temp 07/27/23 1746 98.5 F (36.9 C)     Temp Source 07/27/23 1746 Oral     SpO2 07/27/23 1746 98 %     Weight 07/27/23 1748 218 lb (98.9 kg)     Height 07/27/23 1748 5\' 7"  (1.702 m)     Head Circumference --      Peak Flow --      Pain Score 07/27/23 1748 0     Pain Loc --      Pain Education --      Exclude from Growth Chart --    No data found.  Updated Vital Signs BP 130/87 (BP  Location: Right Arm)   Pulse 62   Temp 98.5 F (36.9 C) (Oral)   Resp 16   Ht 5\' 7"  (1.702 m)   Wt 218 lb (98.9 kg)   LMP 07/26/2023 (Exact Date)   SpO2 98%   Breastfeeding No   BMI 34.14 kg/m   Visual Acuity Right Eye Distance:   Left Eye Distance:   Bilateral Distance:    Right Eye Near:   Left Eye Near:    Bilateral Near:     Physical Exam Vitals and nursing note reviewed.  Constitutional:      Appearance: She is not ill-appearing.  Cardiovascular:     Rate and Rhythm: Normal rate and regular rhythm.     Heart sounds: Normal heart sounds.  Pulmonary:     Effort: Pulmonary effort is normal. No respiratory distress.     Breath sounds: Normal breath sounds. No wheezing or rales.  Abdominal:     General: Bowel sounds are normal.     Palpations: Abdomen is soft.  Neurological:     Mental Status: She is alert and oriented to person, place, and time.      UC Treatments / Results  Labs (all labs ordered are listed, but only abnormal results are displayed) Labs Reviewed  RPR  HIV ANTIBODY (ROUTINE TESTING W REFLEX)  CERVICOVAGINAL ANCILLARY ONLY    EKG   Radiology No results found.  Procedures Procedures (including critical care time)  Medications Ordered in UC Medications - No data to display  Initial Impression / Assessment and Plan / UC Course  I have reviewed the triage vital signs and the nursing notes.  Pertinent labs & imaging results that were available during my care of the patient were reviewed by me and considered in my medical decision making (see chart for details).     22 year old female with exposure to chlamydia no symptoms wants to be tested.  Will treat for chlamydia based on exposure.  Results will be released to patient's MyChart account.  Will contact patient if anything else is positive and requires treatment Final Clinical Impressions(s) / UC Diagnoses   Final  diagnoses:  Possible exposure to STI   Discharge Instructions    None    ED Prescriptions     Medication Sig Dispense Auth. Provider   doxycycline (VIBRAMYCIN) 100 MG capsule Take 1 capsule (100 mg total) by mouth 2 (two) times daily for 7 days. 14 capsule Meliton Rattan, [place]      PDMP not reviewed this encounter.   Meliton Rattan, [place] 07/27/23 1801

## 2023-07-27 NOTE — Discharge Instructions (Addendum)
Test results will be released to your MyChart account Contact you if anything is positive and requires treatment

## 2023-07-28 LAB — CERVICOVAGINAL ANCILLARY ONLY
Bacterial Vaginitis (gardnerella): NEGATIVE
Candida Glabrata: NEGATIVE
Candida Vaginitis: POSITIVE — AB
Chlamydia: NEGATIVE
Comment: NEGATIVE
Comment: NEGATIVE
Comment: NEGATIVE
Comment: NEGATIVE
Comment: NEGATIVE
Comment: NORMAL
Neisseria Gonorrhea: POSITIVE — AB
Trichomonas: POSITIVE — AB

## 2023-07-28 LAB — RPR: RPR Ser Ql: NONREACTIVE

## 2023-07-29 ENCOUNTER — Telehealth: Payer: Self-pay

## 2023-07-29 MED ORDER — METRONIDAZOLE 500 MG PO TABS: 500.0000 mg | ORAL_TABLET | Freq: Two times a day (BID) | ORAL | 0 refills | Status: DC

## 2023-07-29 MED ORDER — FLUCONAZOLE 150 MG PO TABS: 150.0000 mg | ORAL_TABLET | Freq: Once | ORAL | 0 refills | Status: AC

## 2023-07-30 ENCOUNTER — Encounter (HOSPITAL_COMMUNITY): Payer: Self-pay | Admitting: Emergency Medicine

## 2023-07-30 ENCOUNTER — Ambulatory Visit (HOSPITAL_COMMUNITY)
Admission: EM | Admit: 2023-07-30 | Discharge: 2023-07-30 | Disposition: A | Discharge status: Home / Self Care | Payer: Medicaid Other | Attending: Internal Medicine | Admitting: Internal Medicine

## 2023-07-30 DIAGNOSIS — A549 Gonococcal infection, unspecified: Secondary | ICD-10-CM

## 2023-07-30 MED ORDER — CEFTRIAXONE SODIUM 500 MG IJ SOLR
INTRAMUSCULAR | Status: AC
  Filled 2023-07-30: qty 500

## 2023-07-30 MED ORDER — LIDOCAINE HCL (PF) 1 % IJ SOLN
INTRAMUSCULAR | Status: AC
  Filled 2023-07-30: qty 2

## 2023-07-30 MED ORDER — CEFTRIAXONE SODIUM 500 MG IJ SOLR
500.0000 mg | INTRAMUSCULAR | Status: DC
  Administered 2023-07-30: 500 mg via INTRAMUSCULAR

## 2023-07-30 NOTE — ED Triage Notes (Signed)
Pt presents to UC for Gonorrhea tx.

## 2023-08-03 NOTE — Plan of Care (Signed)
[facility] Tonsillectomy/Adenoidectomy, Postoperative PEDS care plan entered in error.

## 2023-08-13 ENCOUNTER — Ambulatory Visit (HOSPITAL_COMMUNITY): Payer: Self-pay

## 2023-09-08 ENCOUNTER — Ambulatory Visit: Payer: Medicaid Other | Admitting: Obstetrics and Gynecology

## 2023-10-16 ENCOUNTER — Other Ambulatory Visit: Payer: Self-pay

## 2023-10-16 ENCOUNTER — Encounter (HOSPITAL_COMMUNITY): Payer: Self-pay | Admitting: Emergency Medicine

## 2023-10-16 ENCOUNTER — Emergency Department (HOSPITAL_COMMUNITY)
Admission: EM | Admit: 2023-10-16 | Discharge: 2023-10-17 | Discharge status: Against Medical Advice | Payer: Medicaid Other | Attending: Emergency Medicine | Admitting: Emergency Medicine

## 2023-10-16 DIAGNOSIS — Z5321 Procedure and treatment not carried out due to patient leaving prior to being seen by health care provider: Secondary | ICD-10-CM | POA: Insufficient documentation

## 2023-10-16 DIAGNOSIS — R519 Headache, unspecified: Secondary | ICD-10-CM | POA: Insufficient documentation

## 2023-10-16 NOTE — ED Notes (Addendum)
 Called pts name x1, no response
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
## 2023-11-30 ENCOUNTER — Encounter: Payer: Self-pay | Admitting: Obstetrics & Gynecology

## 2023-11-30 ENCOUNTER — Ambulatory Visit: Payer: Medicaid Other | Admitting: Obstetrics and Gynecology

## 2023-11-30 ENCOUNTER — Other Ambulatory Visit (HOSPITAL_COMMUNITY)
Admission: RE | Admit: 2023-11-30 | Discharge: 2023-11-30 | Disposition: A | Discharge status: Home / Self Care | Payer: Medicaid Other | Source: Ambulatory Visit | Attending: Obstetrics & Gynecology | Admitting: Obstetrics & Gynecology

## 2023-11-30 ENCOUNTER — Ambulatory Visit (INDEPENDENT_AMBULATORY_CARE_PROVIDER_SITE_OTHER): Payer: Medicaid Other | Admitting: Obstetrics & Gynecology

## 2023-11-30 VITALS — BP 129/84 | HR 72 | Ht 67.5 in | Wt 218.5 lb

## 2023-11-30 DIAGNOSIS — Z124 Encounter for screening for malignant neoplasm of cervix: Secondary | ICD-10-CM | POA: Insufficient documentation

## 2023-11-30 DIAGNOSIS — Z113 Encounter for screening for infections with a predominantly sexual mode of transmission: Secondary | ICD-10-CM

## 2023-11-30 DIAGNOSIS — Z23 Encounter for immunization: Secondary | ICD-10-CM | POA: Diagnosis not present

## 2023-11-30 NOTE — Progress Notes (Unsigned)
 Pt. Presents for std screening. Blood and swab.

## 2023-11-30 NOTE — Progress Notes (Unsigned)
    GYNECOLOGY PROGRESS NOTE  Subjective:    Patient ID: Kathryn Pena, female    DOB: 2001-03-15, 23 y.o.   MRN: [id number]  HPI  Patient is a 23 y.o. [id number](2 and 31 yo children) here for STI screening. She is sexually active and uses condoms sometimes. She has a Mirena IUD in place. She doesn't know if her partner is monogamous.  She has monthly periods with Mirena. She was treated for [facility] 07/2023.  The following portions of the patient's history were reviewed and updated as appropriate: allergies, current medications, past family history, past medical history, past social history, past surgical history, and problem list.  Review of Systems Pertinent items are noted in HPI.   Objective:   Blood pressure 129/84, pulse 72, height 5' 7.5" (1.715 m), weight 218 lb 8 oz (99.1 kg), last menstrual period 11/04/2023, not currently breastfeeding. Body mass index is 33.72 kg/m. Well nourished, well hydrated Black female, no apparent distress She is ambulating and conversing normally. EG- normal Vagina/cervix- normal, parous, IUD string about 3-4 cm long  Assessment:   1. Routine screening for STI (sexually transmitted infection)   2. Screening for cervical cancer      Plan:   1. Routine screening for STI (sexually transmitted infection) (Primary)  - HIV antibody (with reflex) - RPR - Hepatitis B surface antigen - Hepatitis C Antibody - Cervicovaginal ancillary only( [place]) - HSV 1 and 2 Ab, IgG (She requests this one as well)  2. Screening for cervical cancer - Start Gardasil today - Cytology - PAP( )

## 2023-12-01 ENCOUNTER — Other Ambulatory Visit: Payer: Medicaid Other

## 2023-12-01 LAB — CERVICOVAGINAL ANCILLARY ONLY
Bacterial Vaginitis (gardnerella): NEGATIVE
Candida Glabrata: NEGATIVE
Candida Vaginitis: NEGATIVE
Chlamydia: NEGATIVE
Comment: NEGATIVE
Comment: NEGATIVE
Comment: NEGATIVE
Comment: NEGATIVE
Comment: NEGATIVE
Comment: NORMAL
Neisseria Gonorrhea: NEGATIVE
Trichomonas: POSITIVE — AB

## 2023-12-02 LAB — HEPATITIS B SURFACE ANTIGEN: Hepatitis B Surface Ag: NEGATIVE

## 2023-12-02 LAB — RPR: RPR Ser Ql: NONREACTIVE

## 2023-12-02 LAB — HSV 1 AND 2 AB, IGG
HSV 1 Glycoprotein G Ab, IgG: REACTIVE — AB
HSV 2 IgG, Type Spec: REACTIVE — AB

## 2023-12-02 LAB — HIV ANTIBODY (ROUTINE TESTING W REFLEX): HIV Screen 4th Generation wRfx: NONREACTIVE

## 2023-12-02 LAB — HEPATITIS C ANTIBODY: Hep C Virus Ab: NONREACTIVE

## 2023-12-04 LAB — CYTOLOGY - PAP
Diagnosis: NEGATIVE
Diagnosis: REACTIVE

## 2023-12-07 ENCOUNTER — Other Ambulatory Visit: Payer: Self-pay | Admitting: Obstetrics & Gynecology

## 2023-12-07 ENCOUNTER — Encounter: Payer: Self-pay | Admitting: Obstetrics & Gynecology

## 2023-12-07 DIAGNOSIS — A599 Trichomoniasis, unspecified: Secondary | ICD-10-CM

## 2023-12-07 MED ORDER — METRONIDAZOLE 500 MG PO TABS: 500.0000 mg | ORAL_TABLET | Freq: Two times a day (BID) | ORAL | 0 refills | Status: DC

## 2023-12-07 NOTE — Progress Notes (Signed)
 Flagyl prescribed to treat trich seen on Aptima

## 2023-12-24 ENCOUNTER — Encounter (HOSPITAL_COMMUNITY): Payer: Self-pay

## 2023-12-24 ENCOUNTER — Ambulatory Visit (HOSPITAL_COMMUNITY)
Admission: EM | Admit: 2023-12-24 | Discharge: 2023-12-24 | Disposition: A | Discharge status: Home / Self Care | Attending: Family Medicine | Admitting: Family Medicine

## 2023-12-24 DIAGNOSIS — R110 Nausea: Secondary | ICD-10-CM

## 2023-12-24 DIAGNOSIS — R519 Headache, unspecified: Secondary | ICD-10-CM | POA: Diagnosis not present

## 2023-12-24 LAB — POC COVID19/FLU A&B COMBO
Covid Antigen, POC: NEGATIVE
Influenza A Antigen, POC: NEGATIVE
Influenza B Antigen, POC: NEGATIVE

## 2023-12-24 MED ORDER — KETOROLAC TROMETHAMINE 30 MG/ML IJ SOLN
INTRAMUSCULAR | Status: AC
  Filled 2023-12-24: qty 1

## 2023-12-24 MED ORDER — KETOROLAC TROMETHAMINE 30 MG/ML IJ SOLN
30.0000 mg | Freq: Once | INTRAMUSCULAR | Status: AC
  Administered 2023-12-24: 30 mg via INTRAMUSCULAR

## 2023-12-24 MED ORDER — ONDANSETRON 4 MG PO TBDP
4.0000 mg | ORAL_TABLET | Freq: Once | ORAL | Status: AC
  Administered 2023-12-24: 4 mg via ORAL

## 2023-12-24 MED ORDER — ONDANSETRON 4 MG PO TBDP
ORAL_TABLET | ORAL | Status: AC
  Filled 2023-12-24: qty 1

## 2023-12-24 NOTE — ED Provider Notes (Signed)
 [facility]    CSN: [id number] Arrival date & time: 12/24/23  7253      History   Chief Complaint Chief Complaint  Patient presents with   Generalized Body Aches    HPI Kathryn Pena is a 23 y.o. female.   Patient is here for URI symptoms x several days . Having headache, body aches, nausea, diarrhea and cough.  No fevers/chills.  No vomiting.  No nauseated to drink anything.  Her headache is above her eyes, her forehead.  Painful to move her eyes.  She did take tylenol without much help.        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 Authorizing Provider  acetaminophen (TYLENOL) 500 MG tablet Take 500 mg by mouth every 6 (six) hours as needed for moderate pain (pain score 4-6).    [provider]  ibuprofen (ADVIL) 800 MG tablet Take 1 tablet (800 mg total) by mouth 3 (three) times daily. Patient not taking: Reported on 11/30/2023 11/15/22   Carlisle Beers, FNP  metoprolol tartrate (LOPRESSOR) 25 MG tablet Take 0.5 tablets (12.5 mg total) by mouth 2 (two) times daily. Patient not taking: Reported on 12/24/2023 05/18/22 07/02/22  Arrien, York Ram, MD  enalapril  (VASOTEC) 5 MG tablet Take 1 tablet (5 mg total) by mouth daily. 02/24/19 12/21/19  Hermina Staggers, MD    Family History Family History  Problem Relation Age of Onset   Healthy Mother    Hypertension Maternal Aunt    Diabetes Neg Hx    Heart disease Neg Hx    Stroke Neg Hx     Social History Social History   Tobacco Use   Smoking status: Some Days    Types: Cigarettes   Smokeless tobacco: Never   Tobacco comments:    Black N Milds   Vaping Use   Vaping status: Every Day  Substance Use Topics   Alcohol use: No   Drug use: No     Allergies   Dilaudid [hydromorphone hcl]   Review of Systems Review of Systems  Constitutional:  Positive for fatigue.  HENT:  Positive for congestion and rhinorrhea.   Respiratory:  Positive for cough.   Gastrointestinal:  Positive for nausea and vomiting.  Musculoskeletal: Negative.   Neurological:  Positive for headaches.  Psychiatric/Behavioral: Negative.       Physical Exam Triage Vital Signs ED Triage Vitals [12/24/23 0848]  Encounter Vitals Group     BP 122/87     Systolic BP Percentile      Diastolic BP Percentile      Pulse Rate 80     Resp 16     Temp 98.8 F (37.1 C)     Temp Source Oral     SpO2 100 %     Weight 220 lb (99.8 kg)     Height 5\' 7"  (1.702 m)     Head Circumference      Peak Flow      Pain Score 6     Pain Loc      Pain Education      Exclude from Growth Chart    No data found.  Updated Vital Signs BP 122/87 (BP Location: Left Arm)   Pulse 80   Temp 98.8 F (37.1 C) (Oral)   Resp 16   Ht 5\' 7"  (1.702 m)   Wt 99.8 kg   LMP 12/03/2023 (Approximate)   SpO2 100%   Breastfeeding No   BMI 34.46 kg/m   Visual Acuity Right Eye Distance:   Left Eye Distance:   Bilateral Distance:    Right Eye Near:   Left Eye Near:    Bilateral Near:     Physical Exam Constitutional:      General: She is not in acute distress.    Appearance: Normal appearance. She is normal weight. She is  ill-appearing. She is not toxic-appearing.  HENT:     Nose: Nose normal.     Mouth/Throat:     Mouth: Mucous membranes are moist.  Cardiovascular:     Rate and Rhythm: Normal rate and regular rhythm.  Pulmonary:     Effort: Pulmonary effort is normal.     Breath sounds: Normal breath sounds.  Musculoskeletal:     Cervical back: Normal range of motion and neck supple.  Skin:    General: Skin is warm.  Neurological:     General: No focal deficit present.     Mental Status: She is alert.  Psychiatric:        Mood and Affect: Mood normal.      UC Treatments / Results  Labs (all labs ordered are listed, but only abnormal results are displayed) Labs Reviewed  POC COVID19/FLU A&B COMBO    EKG   Radiology No results found.  Procedures Procedures (including critical care time)  Medications Ordered in UC Medications  ketorolac (TORADOL) 30 MG/ML injection 30 mg (30 mg Intramuscular Given 12/24/23 0917)  ondansetron (ZOFRAN-ODT) disintegrating tablet 4 mg (4 mg Oral Given 12/24/23 0917)    Initial Impression / Assessment and Plan / UC Course  I have reviewed the triage vital signs and the nursing notes.  Pertinent labs & imaging results that were available during my care of the patient were reviewed by me and considered in my medical decision making (see chart for details).   Patient seen for upper respiratory symptoms and headache.  Flu/covid negative.  Headache and nausea slightly improved with medications today.  Discussed that there is little else I can offer today.  If she continues with headache, or worsening symptoms, she should be seen in the ER for further evaluation. She is aware and agrees.   Final Clinical Impressions(s) / UC Diagnoses   Final diagnoses:  Nonintractable headache, unspecified chronicity pattern, unspecified headache type  Nausea without vomiting     Discharge Instructions      You were seen today for headache,  nausea and upper  respiratory symptoms.  Your flu and covid were negative today.  You were given a shot of toradol and nausea medication.  Your symptoms are likely viral.   I recommend taking over the counter excedrin migraine if you have not done so already.  However, if your headache does not improve, or if you have worsening headache, nausea, vomiting, dizziness then please go to the ER for further evaluation.     ED Prescriptions   None    PDMP not reviewed this encounter.   Jannifer Franklin, MD 12/24/23 1005

## 2023-12-24 NOTE — ED Notes (Signed)
 Reviewed work note

## 2023-12-24 NOTE — ED Triage Notes (Signed)
 Patient here today with c/o headache, body aches, nausea, diarrhea, and small cough X 2 days. She has taken Tylenol with no relief. Her son has a cough as well.

## 2023-12-24 NOTE — Discharge Instructions (Addendum)
 You were seen today for headache, nausea and upper respiratory symptoms.  Your flu and covid were negative today.  You were given a shot of toradol and nausea medication.  Your symptoms are likely viral.   I recommend taking over the counter excedrin migraine if you have not done so already.  However, if your headache does not improve, or if you have worsening headache, nausea, vomiting, dizziness then please go to the ER for further evaluation.

## 2024-01-28 ENCOUNTER — Ambulatory Visit: Payer: Medicaid Other

## 2024-02-29 ENCOUNTER — Emergency Department (HOSPITAL_COMMUNITY)
Admission: EM | Admit: 2024-02-29 | Discharge: 2024-03-01 | Discharge status: Against Medical Advice | Attending: Emergency Medicine | Admitting: Emergency Medicine

## 2024-02-29 ENCOUNTER — Emergency Department (HOSPITAL_COMMUNITY)

## 2024-02-29 ENCOUNTER — Other Ambulatory Visit: Payer: Self-pay

## 2024-02-29 DIAGNOSIS — R0602 Shortness of breath: Secondary | ICD-10-CM | POA: Insufficient documentation

## 2024-02-29 DIAGNOSIS — M7918 Myalgia, other site: Secondary | ICD-10-CM | POA: Diagnosis not present

## 2024-02-29 DIAGNOSIS — R519 Headache, unspecified: Secondary | ICD-10-CM | POA: Insufficient documentation

## 2024-02-29 DIAGNOSIS — R079 Chest pain, unspecified: Secondary | ICD-10-CM | POA: Insufficient documentation

## 2024-02-29 DIAGNOSIS — R0981 Nasal congestion: Secondary | ICD-10-CM | POA: Insufficient documentation

## 2024-02-29 DIAGNOSIS — Z5321 Procedure and treatment not carried out due to patient leaving prior to being seen by health care provider: Secondary | ICD-10-CM | POA: Diagnosis not present

## 2024-02-29 DIAGNOSIS — R509 Fever, unspecified: Secondary | ICD-10-CM | POA: Diagnosis present

## 2024-02-29 DIAGNOSIS — R059 Cough, unspecified: Secondary | ICD-10-CM | POA: Diagnosis not present

## 2024-02-29 LAB — COMPREHENSIVE METABOLIC PANEL WITH GFR
ALT: 24 U/L (ref 0–44)
AST: 20 U/L (ref 15–41)
Albumin: 3.8 g/dL (ref 3.5–5.0)
Alkaline Phosphatase: 50 U/L (ref 38–126)
Anion gap: 10 (ref 5–15)
BUN: 9 mg/dL (ref 6–20)
CO2: 25 mmol/L (ref 22–32)
Calcium: 9.1 mg/dL (ref 8.9–10.3)
Chloride: 103 mmol/L (ref 98–111)
Creatinine, Ser: 1.14 mg/dL — ABNORMAL HIGH (ref 0.44–1.00)
GFR, Estimated: >60 mL/min (ref >60)
Glucose, Bld: 96 mg/dL (ref 70–99)
Potassium: 3.3 mmol/L — ABNORMAL LOW (ref 3.5–5.1)
Sodium: 138 mmol/L (ref 135–145)
Total Bilirubin: 1.1 mg/dL (ref 0.0–1.2)
Total Protein: 7.6 g/dL (ref 6.5–8.1)

## 2024-02-29 LAB — CBC WITH DIFFERENTIAL/PLATELET
Abs Immature Granulocytes: 0.06 10*3/uL (ref 0.00–0.07)
Basophils Absolute: 0 10*3/uL (ref 0.0–0.1)
Basophils Relative: 0 %
Eosinophils Absolute: 0 10*3/uL (ref 0.0–0.5)
Eosinophils Relative: 0 %
HCT: 42.1 % (ref 36.0–46.0)
Hemoglobin: 15 g/dL (ref 12.0–15.0)
Immature Granulocytes: 0 %
Lymphocytes Relative: 10 %
Lymphs Abs: 1.3 10*3/uL (ref 0.7–4.0)
MCH: 32.2 pg (ref 26.0–34.0)
MCHC: 35.6 g/dL (ref 30.0–36.0)
MCV: 90.3 fL (ref 80.0–100.0)
Monocytes Absolute: 0.8 10*3/uL (ref 0.1–1.0)
Monocytes Relative: 6 %
Neutro Abs: 11.7 10*3/uL — ABNORMAL HIGH (ref 1.7–7.7)
Neutrophils Relative %: 84 %
Platelets: 297 10*3/uL (ref 150–400)
RBC: 4.66 MIL/uL (ref 3.87–5.11)
RDW: 13.9 % (ref 11.5–15.5)
WBC: 13.9 10*3/uL — ABNORMAL HIGH (ref 4.0–10.5)
nRBC: 0 % (ref 0.0–0.2)

## 2024-02-29 LAB — RESP PANEL BY RT-PCR (RSV, FLU A&B, COVID)  RVPGX2
Influenza A by PCR: NEGATIVE
Influenza B by PCR: NEGATIVE
Resp Syncytial Virus by PCR: NEGATIVE
SARS Coronavirus 2 by RT PCR: NEGATIVE

## 2024-02-29 MED ORDER — ACETAMINOPHEN 500 MG PO TABS
1000.0000 mg | ORAL_TABLET | Freq: Once | ORAL | Status: AC
  Administered 2024-02-29: 1000 mg via ORAL
  Filled 2024-02-29: qty 2

## 2024-02-29 NOTE — ED Triage Notes (Signed)
 Pt having gen body aches, fever, cough, HA since yesterday. Productive cough, Denies SOB. Has chest pain when coughing. Fever was 104 last night. Has not taken OTC meds.

## 2024-02-29 NOTE — ED Provider Triage Note (Signed)
 Emergency Medicine Provider Triage Evaluation Note  Kathryn Pena , a 22 y.o. female  was evaluated in triage.  Pt complains of presents to the ER today for evaluation of cough, fever, chills, body aches, mild frontal headache with runny nose and nasal congestion since yesterday.  Patient reports sister had similar symptoms.  Fever she reports Tmax was 104 at home and has not taken any over-the-counter cough or cold medications.  Reports her sister was diagnosed with community-acquired pneumonia.  Reports some shortness of breath and only has chest pain with coughing.  Review of Systems  Positive:  Negative:   Physical Exam  BP 130/86 (BP Location: Left Arm)   Pulse (!) 120   Temp 100 F (37.8 C)   Resp 17   Ht 5\' 7"  (1.702 m)   Wt 99.8 kg   SpO2 100%   BMI 34.46 kg/m  Gen:   Awake, no distress   Resp:  Normal effort  MSK:   Moves extremities without difficulty  Other:  So tachycardia present however patient does have elevated temperature, will give Tylenol   Medical Decision Making  Medically screening exam initiated at 5:32 PM.  Appropriate orders placed.  RHONDA LINAN was informed that the remainder of the evaluation will be completed by another provider, this initial triage assessment does not replace that evaluation, and the importance of remaining in the ED until their evaluation is complete.  Likely viral in etiology, may need additional workup if warranted.   Spence Dux, [place] 02/29/24 1733

## 2024-03-01 ENCOUNTER — Encounter (HOSPITAL_COMMUNITY): Payer: Self-pay

## 2024-03-01 ENCOUNTER — Encounter: Payer: Self-pay | Admitting: Family Medicine

## 2024-03-01 ENCOUNTER — Ambulatory Visit (HOSPITAL_COMMUNITY)
Admission: EM | Admit: 2024-03-01 | Discharge: 2024-03-01 | Disposition: A | Discharge status: Home / Self Care | Attending: Emergency Medicine | Admitting: Emergency Medicine

## 2024-03-01 DIAGNOSIS — R051 Acute cough: Secondary | ICD-10-CM | POA: Diagnosis not present

## 2024-03-01 DIAGNOSIS — J189 Pneumonia, unspecified organism: Secondary | ICD-10-CM | POA: Diagnosis not present

## 2024-03-01 MED ORDER — BENZONATATE 100 MG PO CAPS: 100.0000 mg | ORAL_CAPSULE | Freq: Three times a day (TID) | ORAL | 0 refills | Status: DC

## 2024-03-01 MED ORDER — AZITHROMYCIN 250 MG PO TABS: 250.0000 mg | ORAL_TABLET | Freq: Every day | ORAL | 0 refills | Status: DC

## 2024-03-01 MED ORDER — AMOXICILLIN-POT CLAVULANATE 875-125 MG PO TABS: 1.0000 | ORAL_TABLET | Freq: Two times a day (BID) | ORAL | 0 refills | Status: DC

## 2024-03-01 MED ORDER — PROMETHAZINE-DM 6.25-15 MG/5ML PO SYRP: 5.0000 mL | ORAL_SOLUTION | Freq: Every evening | ORAL | 0 refills | Status: DC | PRN

## 2024-03-01 NOTE — ED Notes (Signed)
 Pt was called for room with no answer

## 2024-03-01 NOTE — Discharge Instructions (Signed)
 Start taking Augmentin twice daily for 7 days. Also start taking azithromycin by taking 2 tablets the first day and 1 tablet for the 4 remaining days. Take Tessalon every 8 hours as needed for cough. Take Promethazine  DM cough syrup at bedtime as needed for cough.  This can make you drowsy so do not drive, work, or drink alcohol while taking this. Alternate betwee 650 mg of Tylenol  and 400 mg of ibuprofen  every 6-8 hours as needed for any pain or fever. Make sure you are staying hydrated and getting plenty of rest. Return here for symptoms persist or worsen.

## 2024-03-01 NOTE — ED Triage Notes (Signed)
 Patient here today with c/o chest tightness with cough and headache X 2 days. Patient was in the ED yesterday but she lest early due to have her kids and them getting rowdy. Patient received results from her lab report and xray in her my chart and saw that she had pneumonia.

## 2024-03-01 NOTE — ED Provider Notes (Signed)
 [facility]    CSN: [id number] Arrival date & time: 03/01/24  1610      History   Chief Complaint Chief Complaint  Patient presents with   Cough    chest pain    HPI Kathryn Pena is a 23 y.o. female.   Patient presents requesting antibiotics for pneumonia.  Patient states that she was seen in the ER yesterday but left early due to needing to take her kids home.  Patient states that she had labs and an x-ray done while she was there.  Patient states that she saw on her MyChart that she had pneumonia.  Patient reports that she has been having cough, congestion, and headache x 1 week.  Patient states that she began to have some chest pain, fever, body aches, and chills yesterday which is why she went to the emergency department.  Patient denies history of asthma or COPD.  Patient reports being a former smoker.  Patient does have history of systolic heart failure and AKI.  The history is provided by the patient and medical records.  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 Authorizing Provider  amoxicillin -clavulanate (AUGMENTIN) 875-125 MG tablet Take 1 tablet by mouth every 12  (twelve) hours. 03/01/24  Yes Rosevelt Constable, Mirelle Biskup A, NP  azithromycin (ZITHROMAX) 250 MG tablet Take 1 tablet (250 mg total) by mouth daily. Take first 2 tablets together, then 1 every day until finished. 03/01/24  Yes Rosevelt Constable, Jontavia Leatherbury A, NP  benzonatate (TESSALON) 100 MG capsule Take 1 capsule (100 mg total) by mouth every 8 (eight) hours. 03/01/24  Yes Levora Reas A, NP  promethazine -dextromethorphan (PROMETHAZINE -DM) 6.25-15 MG/5ML syrup Take 5 mLs by mouth at bedtime as needed for cough. 03/01/24  Yes Rosevelt Constable, Ardian Haberland A, NP  acetaminophen  (TYLENOL ) 500 MG tablet Take 500 mg by mouth every 6 (six) hours as needed for moderate pain (pain score 4-6).    [provider]  ibuprofen  (ADVIL ) 800 MG tablet Take 1 tablet (800 mg total) by mouth 3 (three) times daily. Patient not taking: Reported on 11/30/2023 11/15/22   Starlene Eaton, FNP  enalapril  (VASOTEC ) 5 MG tablet Take 1 tablet (5 mg total) by mouth daily. 02/24/19 12/21/19  Othelia Blinks, MD    Family History Family History  Problem Relation Age of Onset   Healthy Mother    Hypertension Maternal Aunt    Diabetes Neg Hx  Heart disease Neg Hx    Stroke Neg Hx     Social History Social History   Tobacco Use   Smoking status: Former    Types: Cigarettes   Smokeless tobacco: Never   Tobacco comments:    Black N Milds   Vaping Use   Vaping status: Former  Substance Use Topics   Alcohol use: No   Drug use: No     Allergies   Dilaudid  [hydromorphone  hcl]   Review of Systems Review of Systems  Respiratory:  Positive for cough.    Per HPI  Physical Exam Triage Vital Signs ED Triage Vitals  Encounter Vitals Group     BP 03/01/24 1707 119/87     Systolic BP Percentile --      Diastolic BP Percentile --      Pulse Rate 03/01/24 1707 99     Resp 03/01/24 1707 16     Temp 03/01/24 1707 98.9 F (37.2 C)     Temp Source 03/01/24 1707 Oral     SpO2 03/01/24 1707 99 %     Weight --      Height --      Head  Circumference --      Peak Flow --      Pain Score 03/01/24 1711 8     Pain Loc --      Pain Education --      Exclude from Growth Chart --    No data found.  Updated Vital Signs BP 119/87 (BP Location: Right Arm)   Pulse 99   Temp 98.9 F (37.2 C) (Oral)   Resp 16   LMP 02/24/2024 (Approximate)   SpO2 99%   Breastfeeding No   Visual Acuity Right Eye Distance:   Left Eye Distance:   Bilateral Distance:    Right Eye Near:   Left Eye Near:    Bilateral Near:     Physical Exam Vitals and nursing note reviewed.  Constitutional:      General: She is awake. She is not in acute distress.    Appearance: Normal appearance. She is well-developed and well-groomed. She is ill-appearing. She is not toxic-appearing or diaphoretic.  HENT:     Right Ear: Tympanic membrane, ear canal and external ear normal.     Left Ear: Tympanic membrane, ear canal and external ear normal.     Nose: Congestion and rhinorrhea present.     Mouth/Throat:     Mouth: Mucous membranes are moist.     Pharynx: Posterior oropharyngeal erythema present. No oropharyngeal exudate.  Cardiovascular:     Rate and Rhythm: Normal rate and regular rhythm.  Pulmonary:     Effort: Pulmonary effort is normal.     Breath sounds: Normal breath sounds.  Skin:    General: Skin is warm and dry.  Neurological:     Mental Status: She is alert.  Psychiatric:        Behavior: Behavior is cooperative.      UC Treatments / Results  Labs (all labs ordered are listed, but only abnormal results are displayed) Labs Reviewed - No data to display  EKG   Radiology DG Chest 2 View Result Date: 02/29/2024 CLINICAL DATA:  Cough. EXAM: CHEST - 2 VIEW COMPARISON:  05/14/2022 FINDINGS: The cardiomediastinal contours are normal. Small consolidation in the medial left lung base typical of pneumonia. Pulmonary vasculature is normal. No pleural effusion or pneumothorax. No acute osseous abnormalities are seen. IMPRESSION: Small  consolidation in the medial  left lung base typical of pneumonia. Electronically Signed   By: Chadwick Colonel M.D.   On: 02/29/2024 18:26    Procedures Procedures (including critical care time)  Medications Ordered in UC Medications - No data to display  Initial Impression / Assessment and Plan / UC Course  I have reviewed the triage vital signs and the nursing notes.  Pertinent labs & imaging results that were available during my care of the patient were reviewed by me and considered in my medical decision making (see chart for details).     Patient is ill-appearing.  Vitals are stable.  Upon assessment there is some mild congestion and rhinorrhea present, mild erythema noted to pharynx.  Lungs clear bilaterally on auscultation.  X-ray performed in the ER revealed a small consolidation in the medial left lung base typical of pneumonia.  I agree to these findings white blood cell count was also elevated at 13.9.  Prescribed Augmentin and azithromycin for pneumonia coverage.  Prescribed Tessalon and Promethazine  DM as needed for cough.  Recommended alternating between Tylenol  ibuprofen  as needed for pain or fever.  Discussed return precautions. Final Clinical Impressions(s) / UC Diagnoses   Final diagnoses:  Community acquired pneumonia of left lower lobe of lung  Acute cough     Discharge Instructions      Start taking Augmentin twice daily for 7 days. Also start taking azithromycin by taking 2 tablets the first day and 1 tablet for the 4 remaining days. Take Tessalon every 8 hours as needed for cough. Take Promethazine  DM cough syrup at bedtime as needed for cough.  This can make you drowsy so do not drive, work, or drink alcohol while taking this. Alternate betwee 650 mg of Tylenol  and 400 mg of ibuprofen  every 6-8 hours as needed for any pain or fever. Make sure you are staying hydrated and getting plenty of rest. Return here for symptoms persist or worsen.  ED  Prescriptions     Medication Sig Dispense Auth. Provider   amoxicillin -clavulanate (AUGMENTIN) 875-125 MG tablet Take 1 tablet by mouth every 12 (twelve) hours. 14 tablet Rosevelt Constable, Romie Tay A, NP   azithromycin (ZITHROMAX) 250 MG tablet Take 1 tablet (250 mg total) by mouth daily. Take first 2 tablets together, then 1 every day until finished. 6 tablet Levora Reas A, NP   benzonatate (TESSALON) 100 MG capsule Take 1 capsule (100 mg total) by mouth every 8 (eight) hours. 21 capsule Levora Reas A, NP   promethazine -dextromethorphan (PROMETHAZINE -DM) 6.25-15 MG/5ML syrup Take 5 mLs by mouth at bedtime as needed for cough. 118 mL Levora Reas A, NP      PDMP not reviewed this encounter.   Levora Reas A, NP 03/01/24 [phone]

## 2024-05-06 ENCOUNTER — Emergency Department (HOSPITAL_COMMUNITY)
Admission: EM | Admit: 2024-05-06 | Discharge: 2024-05-06 | Disposition: A | Discharge status: Home / Self Care | Attending: Emergency Medicine | Admitting: Emergency Medicine

## 2024-05-06 ENCOUNTER — Other Ambulatory Visit: Payer: Self-pay

## 2024-05-06 ENCOUNTER — Encounter (HOSPITAL_COMMUNITY): Payer: Self-pay | Admitting: Emergency Medicine

## 2024-05-06 DIAGNOSIS — N3001 Acute cystitis with hematuria: Secondary | ICD-10-CM | POA: Insufficient documentation

## 2024-05-06 DIAGNOSIS — R300 Dysuria: Secondary | ICD-10-CM | POA: Diagnosis present

## 2024-05-06 LAB — CBC WITH DIFFERENTIAL/PLATELET
Abs Immature Granulocytes: 0.06 K/uL (ref 0.00–0.07)
Basophils Absolute: 0 K/uL (ref 0.0–0.1)
Basophils Relative: 0 %
Eosinophils Absolute: 0 K/uL (ref 0.0–0.5)
Eosinophils Relative: 0 %
HCT: 39.2 % (ref 36.0–46.0)
Hemoglobin: 12.8 g/dL (ref 12.0–15.0)
Immature Granulocytes: 1 %
Lymphocytes Relative: 8 %
Lymphs Abs: 1 K/uL (ref 0.7–4.0)
MCH: 31.4 pg (ref 26.0–34.0)
MCHC: 32.7 g/dL (ref 30.0–36.0)
MCV: 96.1 fL (ref 80.0–100.0)
Monocytes Absolute: 1.2 K/uL — ABNORMAL HIGH (ref 0.1–1.0)
Monocytes Relative: 9 %
Neutro Abs: 9.9 K/uL — ABNORMAL HIGH (ref 1.7–7.7)
Neutrophils Relative %: 82 %
Platelets: 238 K/uL (ref 150–400)
RBC: 4.08 MIL/uL (ref 3.87–5.11)
RDW: 13.4 % (ref 11.5–15.5)
WBC: 12.2 K/uL — ABNORMAL HIGH (ref 4.0–10.5)
nRBC: 0 % (ref 0.0–0.2)

## 2024-05-06 LAB — URINALYSIS, W/ REFLEX TO CULTURE (INFECTION SUSPECTED)
Bilirubin Urine: NEGATIVE
Glucose, UA: 50 mg/dL — AB
Ketones, ur: NEGATIVE mg/dL
Leukocytes,Ua: NEGATIVE
Nitrite: POSITIVE — AB
Protein, ur: 100 mg/dL — AB
Specific Gravity, Urine: 1.01 (ref 1.005–1.030)
pH: 7 (ref 5.0–8.0)

## 2024-05-06 LAB — COMPREHENSIVE METABOLIC PANEL WITH GFR
ALT: 51 U/L — ABNORMAL HIGH (ref 0–44)
AST: 40 U/L (ref 15–41)
Albumin: 3.7 g/dL (ref 3.5–5.0)
Alkaline Phosphatase: 93 U/L (ref 38–126)
Anion gap: 11 (ref 5–15)
BUN: 8 mg/dL (ref 6–20)
CO2: 22 mmol/L (ref 22–32)
Calcium: 9 mg/dL (ref 8.9–10.3)
Chloride: 101 mmol/L (ref 98–111)
Creatinine, Ser: 0.95 mg/dL (ref 0.44–1.00)
GFR, Estimated: >60 mL/min (ref >60)
Glucose, Bld: 104 mg/dL — ABNORMAL HIGH (ref 70–99)
Potassium: 3.4 mmol/L — ABNORMAL LOW (ref 3.5–5.1)
Sodium: 134 mmol/L — ABNORMAL LOW (ref 135–145)
Total Bilirubin: 3.4 mg/dL — ABNORMAL HIGH (ref 0.0–1.2)
Total Protein: 8 g/dL (ref 6.5–8.1)

## 2024-05-06 LAB — LIPASE, BLOOD: Lipase: 27 U/L (ref 11–51)

## 2024-05-06 MED ORDER — SODIUM CHLORIDE 0.9 % IV BOLUS
1000.0000 mL | Freq: Once | INTRAVENOUS | Status: AC
  Administered 2024-05-06: 1000 mL via INTRAVENOUS

## 2024-05-06 MED ORDER — ONDANSETRON 4 MG PO TBDP: ORAL_TABLET | ORAL | 0 refills | Status: DC

## 2024-05-06 MED ORDER — ONDANSETRON 8 MG PO TBDP
8.0000 mg | ORAL_TABLET | Freq: Once | ORAL | Status: AC
  Administered 2024-05-06: 8 mg via ORAL
  Filled 2024-05-06: qty 1

## 2024-05-06 MED ORDER — ACETAMINOPHEN 325 MG PO TABS
650.0000 mg | ORAL_TABLET | Freq: Once | ORAL | Status: AC
  Administered 2024-05-06: 650 mg via ORAL
  Filled 2024-05-06: qty 2

## 2024-05-06 MED ORDER — CEPHALEXIN 500 MG PO CAPS: 500.0000 mg | ORAL_CAPSULE | Freq: Four times a day (QID) | ORAL | 0 refills | Status: DC

## 2024-05-06 MED ORDER — OXYCODONE-ACETAMINOPHEN 5-325 MG PO TABS: ORAL_TABLET | ORAL | 0 refills | Status: DC

## 2024-05-06 MED ORDER — OXYCODONE-ACETAMINOPHEN 5-325 MG PO TABS
1.0000 | ORAL_TABLET | Freq: Once | ORAL | Status: AC
  Administered 2024-05-06: 1 via ORAL
  Filled 2024-05-06: qty 1

## 2024-05-06 MED ORDER — SODIUM CHLORIDE 0.9 % IV SOLN
2.0000 g | Freq: Once | INTRAVENOUS | Status: AC
  Administered 2024-05-06: 2 g via INTRAVENOUS
  Filled 2024-05-06: qty 20

## 2024-05-06 NOTE — ED Triage Notes (Signed)
 Pt BIB EMS from home, c/o right flank pain that starts a the bottom rib cage and radiates down to groin. Pain has gotten progressively worse since last night. Also c/o chills, nausea, and cloudy urine.   BP 130/88 P 117 SpO2 98%

## 2024-05-06 NOTE — Discharge Instructions (Signed)
 Follow-up with your GYN doctor next week

## 2024-05-06 NOTE — ED Provider Triage Note (Signed)
 Emergency Medicine Provider Triage Evaluation Note  Kathryn Pena , a 23 y.o. female  was evaluated in triage.  Pt complains of r sided flank pain x 1wk.  Review of Systems  Positive: Urgency, frequency, fever, chills, nausea, diarrhea Negative: Emesis, cough, congestion, vaginal discharge,   Physical Exam  BP 135/85 (BP Location: Left Arm)   Pulse (!) 114   Temp (!) 102.9 F (39.4 C)   Resp 16   SpO2 100%  Gen:   Awake, no distress, uncomfortable appearing Resp:  Normal effort  MSK:   Moves extremities without difficulty  Other:    Medical Decision Making  Medically screening exam initiated at 9:14 AM.  Appropriate orders placed.  Kathryn Pena was informed that the remainder of the evaluation will be completed by another provider, this initial triage assessment does not replace that evaluation, and the importance of remaining in the ED until their evaluation is complete.  Labs/meds ordered   Francis Ileana SAILOR, PA-C 05/06/24 [phone]

## 2024-05-06 NOTE — ED Provider Notes (Signed)
 [place] EM[facility]ENC[facility] DEPARTMENT AT [facility] Provider Note   CSN: [id number] Arrival date & time: 05/06/24  9094     Patient presents with: No chief complaint on file.   Kathryn Pena is a 23 y.o. female.   Patient complains of right flank pain with dysuria and frequency  The history is provided by the patient and medical records. No language interpreter was used.  Flank Pain This is a new problem. The current episode started 6 to 12 hours ago. The problem occurs constantly. The problem has not changed since onset.Pertinent negatives include no chest pain, no abdominal pain and no headaches. Nothing aggravates the symptoms. Nothing relieves the symptoms. She has tried nothing for the symptoms.       Prior to Admission medications   Medication Sig Start Date End Date Taking? Authorizing Provider  cephALEXin  (KEFLEX ) 500 MG capsule Take 1 capsule (500 mg total) by mouth 4 (four) times daily. 05/06/24  Yes Keiva Dina, MD  ondansetron  (ZOFRAN -ODT) 4 MG disintegrating tablet 4mg  ODT q4 hours prn nausea/vomit 05/06/24  Yes Iren Whipp, MD  oxyCODONE -acetaminophen  (PERCOCET) 5-325 MG tablet Take 1 every 6 hours for pain not relieved by Tylenol  or Motrin  alone 05/06/24  Yes Betheny Suchecki, MD  acetaminophen  (TYLENOL ) 500 MG tablet Take 500 mg by mouth every 6 (six) hours as needed for moderate pain (pain score 4-6).    [provider]  amoxicillin -clavulanate (AUGMENTIN ) 875-125 MG tablet Take 1 tablet by mouth every 12 (twelve) hours. 03/01/24   Johnie Flaming A, NP  azithromycin  (ZITHROMAX ) 250 MG tablet Take 1 tablet (250 mg total) by mouth daily. Take first 2 tablets together, then 1 every day until finished. 03/01/24   Johnie Flaming A, NP  benzonatate  (TESSALON ) 100 MG capsule Take 1 capsule (100 mg total) by mouth every 8 (eight) hours. 03/01/24   Johnie Flaming A, NP  ibuprofen  (ADVIL ) 800 MG tablet Take 1 tablet (800 mg total) by mouth 3 (three)  times daily. Patient not taking: Reported on 11/30/2023 11/15/22   Enedelia Dorna HERO, FNP  promethazine -dextromethorphan (PROMETHAZINE -DM) 6.25-15 MG/5ML syrup Take 5 mLs by mouth at bedtime as needed for cough. 03/01/24   Johnie Flaming A, NP  enalapril  (VASOTEC ) 5 MG tablet Take 1 tablet (5 mg total) by mouth daily. 02/24/19 12/21/19  Ervin, Michael L, MD    Allergies: Dilaudid  [hydromorphone  hcl]    Review of Systems  Constitutional:  Negative for appetite change and fatigue.  HENT:  Negative for congestion, ear discharge and sinus pressure.   Eyes:  Negative for discharge.  Respiratory:  Negative for cough.   Cardiovascular:  Negative for chest pain.  Gastrointestinal:  Negative for abdominal pain and diarrhea.  Genitourinary:  Positive for flank pain. Negative for frequency and hematuria.  Musculoskeletal:  Negative for back pain.  Skin:  Negative for rash.  Neurological:  Negative for seizures and headaches.  Psychiatric/Behavioral:  Negative for hallucinations.     Updated Vital Signs BP 114/65 (BP Location: Left Arm)   Pulse 90   Temp 98.9 F (37.2 C) (Oral)   Resp 16   SpO2 97%   Physical Exam Vitals and nursing note reviewed.  Constitutional:      Appearance: She is well-developed.  HENT:     Head: Normocephalic.     Nose: Nose normal.  Eyes:     General: No scleral icterus.    Conjunctiva/sclera: Conjunctivae normal.  Neck:     Thyroid: No thyromegaly.  Cardiovascular:  Rate and Rhythm: Normal rate and regular rhythm.     Heart sounds: No murmur heard.    No friction rub. No gallop.  Pulmonary:     Breath sounds: No stridor. No wheezing or rales.  Chest:     Chest wall: No tenderness.  Abdominal:     General: There is no distension.     Tenderness: There is no abdominal tenderness. There is no rebound.  Genitourinary:    Comments: Tender right flank Musculoskeletal:        General: Normal range of motion.     Cervical back: Neck supple.   Lymphadenopathy:     Cervical: No cervical adenopathy.  Skin:    Findings: No erythema or rash.  Neurological:     Mental Status: She is alert and oriented to person, place, and time.     Motor: No abnormal muscle tone.     Coordination: Coordination normal.  Psychiatric:        Behavior: Behavior normal.     (all labs ordered are listed, but only abnormal results are displayed) Labs Reviewed  COMPREHENSIVE METABOLIC PANEL WITH GFR - Abnormal; Notable for the following components:      Result Value   Sodium 134 (*)    Potassium 3.4 (*)    Glucose, Bld 104 (*)    ALT 51 (*)    Total Bilirubin 3.4 (*)    All other components within normal limits  CBC WITH DIFFERENTIAL/PLATELET - Abnormal; Notable for the following components:   WBC 12.2 (*)    Neutro Abs 9.9 (*)    Monocytes Absolute 1.2 (*)    All other components within normal limits  URINALYSIS, W/ REFLEX TO CULTURE (INFECTION SUSPECTED) - Abnormal; Notable for the following components:   Color, Urine AMBER (*)    Glucose, UA 50 (*)    Hgb urine dipstick MODERATE (*)    Protein, ur 100 (*)    Nitrite POSITIVE (*)    Bacteria, UA FEW (*)    All other components within normal limits  LIPASE, BLOOD    EKG: None  Radiology: No results found.   Procedures   Medications Ordered in the ED  oxyCODONE -acetaminophen  (PERCOCET/ROXICET) 5-325 MG per tablet 1 tablet (1 tablet Oral Given 05/06/24 1008)  ondansetron  (ZOFRAN -ODT) disintegrating tablet 8 mg (8 mg Oral Given 05/06/24 1007)  acetaminophen  (TYLENOL ) tablet 650 mg (650 mg Oral Given 05/06/24 1007)  sodium chloride  0.9 % bolus 1,000 mL (1,000 mLs Intravenous New Bag/Given 05/06/24 1219)  cefTRIAXone  (ROCEPHIN ) 2 g in sodium chloride  0.9 % 100 mL IVPB (2 g Intravenous New Bag/Given 05/06/24 1237)                                    Medical Decision Making Risk Prescription drug management.   Patient with a urinary tract infection.  Patient will be sent home with  Keflex , Zofran , Percocet send follow-up with her GYN doctor     Final diagnoses:  Acute cystitis with hematuria    ED Discharge Orders          Ordered    cephALEXin  (KEFLEX ) 500 MG capsule  4 times daily        05/06/24 1417    ondansetron  (ZOFRAN -ODT) 4 MG disintegrating tablet        05/06/24 1417    oxyCODONE -acetaminophen  (PERCOCET) 5-325 MG tablet        05/06/24 1417  Suzette Pac, MD 05/08/24 [phone]

## 2024-05-08 ENCOUNTER — Ambulatory Visit (HOSPITAL_COMMUNITY): Payer: Self-pay

## 2024-08-01 ENCOUNTER — Ambulatory Visit (HOSPITAL_COMMUNITY)
Admission: RE | Admit: 2024-08-01 | Discharge: 2024-08-01 | Disposition: A | Discharge status: Home / Self Care | Payer: Self-pay | Source: Ambulatory Visit | Attending: Family Medicine | Admitting: Family Medicine

## 2024-08-01 ENCOUNTER — Encounter (HOSPITAL_COMMUNITY): Payer: Self-pay

## 2024-08-01 VITALS — BP 129/81 | HR 85 | Temp 99.1°F | Resp 16

## 2024-08-01 DIAGNOSIS — N926 Irregular menstruation, unspecified: Secondary | ICD-10-CM | POA: Insufficient documentation

## 2024-08-01 LAB — POCT URINE PREGNANCY: Preg Test, Ur: NEGATIVE

## 2024-08-01 NOTE — ED Provider Notes (Addendum)
 [facility]    CSN: [id number] Arrival date & time: 08/01/24  1614      History   Chief Complaint Chief Complaint  Patient presents with   Vaginal Bleeding    spotting in between periods - Entered by patient    HPI Kathryn Pena is a 23 y.o. female.    Vaginal Bleeding Here for irregular vaginal bleeding.  She had a menstrual cycle started on September 27.  That 1 was a little heavy and she did have some cramping with that.  That bleeding went away with in a few days but then she started having another menstrual cycle on October 11  The bleeding is tapering off.  She is not having any pelvic cramping now.  No fever and no dysuria.  No vaginal discharge or itching.  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 Authorizing Provider  enalapril  (VASOTEC ) 5 MG tablet Take 1 tablet (5 mg total) by mouth daily. 02/24/19 12/21/19  Lorence Ozell CROME, MD    Family History Family History  Problem Relation Age of Onset   Healthy Mother    Hypertension Maternal Aunt    Diabetes Neg Hx    Heart disease Neg Hx    Stroke Neg Hx     Social  History Social History   Tobacco Use   Smoking status: Every Day    Types: Cigars   Smokeless tobacco: Never   Tobacco comments:    Black N Milds   Vaping Use   Vaping status: Former  Substance Use Topics   Alcohol use: Yes    Comment: occasionally   Drug use: No     Allergies   Dilaudid  [hydromorphone  hcl]   Review of Systems Review of Systems  Genitourinary:  Positive for vaginal bleeding.     Physical Exam Triage Vital Signs ED Triage Vitals [08/01/24 1701]  Encounter Vitals Group     BP 129/81     Girls Systolic BP Percentile      Girls Diastolic BP Percentile      Boys Systolic BP Percentile      Boys Diastolic BP Percentile      Pulse Rate 85     Resp 16     Temp 99.1 F (37.3 C)     Temp Source Oral     SpO2 96 %  Weight      Height      Head Circumference      Peak Flow      Pain Score 0     Pain Loc      Pain Education      Exclude from Growth Chart    No data found.  Updated Vital Signs BP 129/81 (BP Location: Right Arm)   Pulse 85   Temp 99.1 F (37.3 C) (Oral)   Resp 16   LMP 07/23/2024 (Approximate)   SpO2 96%   Visual Acuity Right Eye Distance:   Left Eye Distance:   Bilateral Distance:    Right Eye Near:   Left Eye Near:    Bilateral Near:     Physical Exam Vitals reviewed.  Constitutional:      General: She is not in acute distress.    Appearance: She is not ill-appearing, toxic-appearing or diaphoretic.  HENT:     Mouth/Throat:     Mouth: Mucous membranes are moist.  Eyes:     Extraocular Movements: Extraocular movements intact.     Conjunctiva/sclera: Conjunctivae normal.     Pupils: Pupils are equal, round, and reactive to light.  Cardiovascular:     Rate and Rhythm: Normal rate and regular rhythm.     Heart sounds: No murmur heard. Pulmonary:     Effort: Pulmonary effort is normal.     Breath sounds: Normal breath sounds.  Abdominal:     Palpations: Abdomen is soft.     Tenderness: There is no abdominal  tenderness.  Musculoskeletal:     Cervical back: Neck supple.  Lymphadenopathy:     Cervical: No cervical adenopathy.  Skin:    Coloration: Skin is not jaundiced or pale.  Neurological:     General: No focal deficit present.     Mental Status: She is alert and oriented to person, place, and time.  Psychiatric:        Behavior: Behavior normal.      UC Treatments / Results  Labs (all labs ordered are listed, but only abnormal results are displayed) Labs Reviewed  POCT URINE PREGNANCY  CERVICOVAGINAL ANCILLARY ONLY    EKG   Radiology No results found.  Procedures Procedures (including critical care time)  Medications Ordered in UC Medications - No data to display  Initial Impression / Assessment and Plan / UC Course  I have reviewed the triage vital signs and the nursing notes.  Pertinent labs & imaging results that were available during my care of the patient were reviewed by me and considered in my medical decision making (see chart for details).     UPT is negative. Vaginal self swab is done, and we will notify of any positives on that and treat per protocol.  She will followup with her gyn provider  Final Clinical Impressions(s) / UC Diagnoses   Final diagnoses:  Irregular menses     Discharge Instructions      The pregnancy test was negative  Staff will notify you if there is anything positive on the swab. It can take 2-3 days for the tests to result, depending on the day of the week your test was taken. You will only be notified if there are any positives on the testing; test results will also go to your MyChart if you are signed up for MyChart.   Please follow-up with your OB/GYN provider about this problem.      ED Prescriptions   None    PDMP  not reviewed this encounter.   Vonna Sharlet POUR, MD 08/01/24 1754    Vonna Sharlet POUR, MD 08/01/24 1755    Vonna Sharlet POUR, MD 08/01/24 [phone]

## 2024-08-01 NOTE — ED Triage Notes (Signed)
 Patient here today with c/o vaginal bleeding that has been lasting longer than normal and more frequent. Patient states that she had some abd cramping but that has gone. Bleeding stopped 2 days ago. Patient currently has the IUD. This is the second year.

## 2024-08-01 NOTE — Discharge Instructions (Addendum)
 The pregnancy test was negative  Staff will notify you if there is anything positive on the swab. It can take 2-3 days for the tests to result, depending on the day of the week your test was taken. You will only be notified if there are any positives on the testing; test results will also go to your MyChart if you are signed up for MyChart.   Please follow-up with your OB/GYN provider about this problem.

## 2024-08-03 LAB — CERVICOVAGINAL ANCILLARY ONLY
Chlamydia: POSITIVE — AB
Comment: NEGATIVE
Comment: NEGATIVE
Comment: NORMAL
Neisseria Gonorrhea: NEGATIVE
Trichomonas: NEGATIVE

## 2024-08-04 ENCOUNTER — Ambulatory Visit (HOSPITAL_COMMUNITY): Payer: Self-pay

## 2024-08-04 MED ORDER — DOXYCYCLINE HYCLATE 100 MG PO TABS: 100.0000 mg | ORAL_TABLET | Freq: Two times a day (BID) | ORAL | 0 refills | Status: AC
